# Patient Record
Sex: Female | Born: 1945
Health system: Southern US, Community
[De-identification: ages and names within clinical notes are randomized; demographics above are authoritative.]

## PROBLEM LIST (undated history)

## (undated) DIAGNOSIS — M899 Disorder of bone, unspecified: Secondary | ICD-10-CM

## (undated) DIAGNOSIS — K8020 Calculus of gallbladder without cholecystitis without obstruction: Secondary | ICD-10-CM

## (undated) DIAGNOSIS — Z8659 Personal history of other mental and behavioral disorders: Secondary | ICD-10-CM

## (undated) DIAGNOSIS — K219 Gastro-esophageal reflux disease without esophagitis: Secondary | ICD-10-CM

## (undated) DIAGNOSIS — R300 Dysuria: Secondary | ICD-10-CM

## (undated) DIAGNOSIS — F419 Anxiety disorder, unspecified: Secondary | ICD-10-CM

## (undated) DIAGNOSIS — M1990 Unspecified osteoarthritis, unspecified site: Secondary | ICD-10-CM

## (undated) DIAGNOSIS — J309 Allergic rhinitis, unspecified: Secondary | ICD-10-CM

## (undated) DIAGNOSIS — R609 Edema, unspecified: Secondary | ICD-10-CM

## (undated) DIAGNOSIS — E785 Hyperlipidemia, unspecified: Secondary | ICD-10-CM

## (undated) DIAGNOSIS — M949 Disorder of cartilage, unspecified: Secondary | ICD-10-CM

## (undated) DIAGNOSIS — I493 Ventricular premature depolarization: Secondary | ICD-10-CM

## (undated) DIAGNOSIS — Z9889 Other specified postprocedural states: Secondary | ICD-10-CM

## (undated) DIAGNOSIS — I10 Essential (primary) hypertension: Secondary | ICD-10-CM

## (undated) DIAGNOSIS — E739 Lactose intolerance, unspecified: Secondary | ICD-10-CM

## (undated) DIAGNOSIS — Z8719 Personal history of other diseases of the digestive system: Secondary | ICD-10-CM

## (undated) HISTORY — DX: Essential (primary) hypertension: I10

## (undated) HISTORY — PX: CATARACT EXTRACTION, BILATERAL: SHX1313

## (undated) HISTORY — DX: Disorder of cartilage, unspecified: M94.9

## (undated) HISTORY — DX: Anxiety disorder, unspecified: F41.9

## (undated) HISTORY — DX: Lactose intolerance, unspecified: E73.9

## (undated) HISTORY — DX: Other specified postprocedural states: Z98.890

## (undated) HISTORY — DX: Calculus of gallbladder without cholecystitis without obstruction: K80.20

## (undated) HISTORY — DX: Gastro-esophageal reflux disease without esophagitis: K21.9

## (undated) HISTORY — DX: Personal history of other diseases of the digestive system: Z87.19

## (undated) HISTORY — PX: OTHER SURGICAL HISTORY: SHX169

## (undated) HISTORY — PX: AUGMENTATION MAMMAPLASTY: SUR837

## (undated) HISTORY — DX: Personal history of other mental and behavioral disorders: Z86.59

## (undated) HISTORY — DX: Dysuria: R30.0

## (undated) HISTORY — DX: Hyperlipidemia, unspecified: E78.5

## (undated) HISTORY — DX: Disorder of bone, unspecified: M89.9

## (undated) HISTORY — DX: Allergic rhinitis, unspecified: J30.9

## (undated) HISTORY — DX: Edema, unspecified: R60.9

## (undated) HISTORY — DX: Ventricular premature depolarization: I49.3

## (undated) HISTORY — DX: Unspecified osteoarthritis, unspecified site: M19.90

---

## 1972-07-21 HISTORY — PX: CHOLECYSTECTOMY: SHX55

## 1972-07-21 HISTORY — PX: BREAST SURGERY: SHX581

## 1984-07-21 HISTORY — PX: ABDOMINAL HYSTERECTOMY: SHX81

## 1985-07-21 HISTORY — PX: BREAST ENHANCEMENT SURGERY: SHX7

## 2008-07-28 ENCOUNTER — Encounter: Payer: Self-pay | Admitting: Family Medicine

## 2009-04-16 ENCOUNTER — Encounter: Admission: RE | Admit: 2009-04-16 | Discharge: 2009-04-16 | Payer: Self-pay | Admitting: *Deleted

## 2009-04-16 ENCOUNTER — Encounter: Payer: Self-pay | Admitting: Family Medicine

## 2009-04-17 ENCOUNTER — Encounter: Admission: RE | Admit: 2009-04-17 | Discharge: 2009-04-17 | Payer: Self-pay | Admitting: *Deleted

## 2009-04-30 ENCOUNTER — Ambulatory Visit: Payer: Self-pay | Admitting: Family Medicine

## 2009-04-30 DIAGNOSIS — E785 Hyperlipidemia, unspecified: Secondary | ICD-10-CM

## 2009-04-30 DIAGNOSIS — J309 Allergic rhinitis, unspecified: Secondary | ICD-10-CM | POA: Insufficient documentation

## 2009-04-30 DIAGNOSIS — I10 Essential (primary) hypertension: Secondary | ICD-10-CM | POA: Insufficient documentation

## 2009-04-30 DIAGNOSIS — K219 Gastro-esophageal reflux disease without esophagitis: Secondary | ICD-10-CM | POA: Insufficient documentation

## 2009-04-30 DIAGNOSIS — M899 Disorder of bone, unspecified: Secondary | ICD-10-CM | POA: Insufficient documentation

## 2009-04-30 DIAGNOSIS — M949 Disorder of cartilage, unspecified: Secondary | ICD-10-CM

## 2009-04-30 HISTORY — DX: Disorder of bone, unspecified: M89.9

## 2009-04-30 HISTORY — DX: Gastro-esophageal reflux disease without esophagitis: K21.9

## 2009-04-30 HISTORY — DX: Essential (primary) hypertension: I10

## 2009-04-30 HISTORY — DX: Allergic rhinitis, unspecified: J30.9

## 2009-04-30 HISTORY — DX: Hyperlipidemia, unspecified: E78.5

## 2009-04-30 LAB — CONVERTED CEMR LAB
Cholesterol, target level: 200 mg/dL
HDL goal, serum: 40 mg/dL
LDL Goal: 130 mg/dL

## 2009-05-09 ENCOUNTER — Encounter: Payer: Self-pay | Admitting: Family Medicine

## 2009-07-18 ENCOUNTER — Telehealth: Payer: Self-pay | Admitting: Family Medicine

## 2009-08-13 ENCOUNTER — Ambulatory Visit: Payer: Self-pay | Admitting: Family Medicine

## 2009-08-13 DIAGNOSIS — S8010XA Contusion of unspecified lower leg, initial encounter: Secondary | ICD-10-CM | POA: Insufficient documentation

## 2009-08-23 ENCOUNTER — Telehealth: Payer: Self-pay | Admitting: Family Medicine

## 2009-09-14 ENCOUNTER — Ambulatory Visit: Payer: Self-pay | Admitting: Family Medicine

## 2009-09-17 ENCOUNTER — Ambulatory Visit: Payer: Self-pay | Admitting: Family Medicine

## 2009-10-12 ENCOUNTER — Telehealth: Payer: Self-pay | Admitting: Family Medicine

## 2009-12-12 ENCOUNTER — Ambulatory Visit: Payer: Self-pay | Admitting: Family Medicine

## 2009-12-12 DIAGNOSIS — R609 Edema, unspecified: Secondary | ICD-10-CM

## 2009-12-12 HISTORY — DX: Edema, unspecified: R60.9

## 2009-12-13 ENCOUNTER — Encounter: Payer: Self-pay | Admitting: Family Medicine

## 2009-12-14 ENCOUNTER — Encounter: Payer: Self-pay | Admitting: Family Medicine

## 2009-12-14 ENCOUNTER — Ambulatory Visit: Payer: Self-pay

## 2010-04-11 ENCOUNTER — Ambulatory Visit: Payer: Self-pay | Admitting: Family Medicine

## 2010-04-11 DIAGNOSIS — Z8659 Personal history of other mental and behavioral disorders: Secondary | ICD-10-CM | POA: Insufficient documentation

## 2010-04-11 HISTORY — DX: Personal history of other mental and behavioral disorders: Z86.59

## 2010-05-06 ENCOUNTER — Ambulatory Visit: Payer: Self-pay | Admitting: Family Medicine

## 2010-05-06 DIAGNOSIS — R300 Dysuria: Secondary | ICD-10-CM | POA: Insufficient documentation

## 2010-05-06 HISTORY — DX: Dysuria: R30.0

## 2010-05-06 LAB — CONVERTED CEMR LAB
Ketones, urine, test strip: NEGATIVE
Nitrite: NEGATIVE
Specific Gravity, Urine: 1.02

## 2010-05-08 ENCOUNTER — Encounter: Payer: Self-pay | Admitting: Family Medicine

## 2010-05-20 ENCOUNTER — Telehealth: Payer: Self-pay | Admitting: Family Medicine

## 2010-06-03 ENCOUNTER — Telehealth: Payer: Self-pay | Admitting: *Deleted

## 2010-06-10 ENCOUNTER — Ambulatory Visit: Payer: Self-pay | Admitting: Family Medicine

## 2010-07-17 ENCOUNTER — Ambulatory Visit
Admission: RE | Admit: 2010-07-17 | Discharge: 2010-07-17 | Payer: Self-pay | Source: Home / Self Care | Attending: Family Medicine | Admitting: Family Medicine

## 2010-07-26 ENCOUNTER — Telehealth: Payer: Self-pay | Admitting: Family Medicine

## 2010-08-20 NOTE — Miscellaneous (Signed)
Summary: Orders Update  Clinical Lists Changes  Orders: Added new Test order of Venous Duplex Lower Extremity (Venous Duplex Lower) - Signed 

## 2010-08-20 NOTE — Progress Notes (Signed)
Summary: zoloft  Phone Note Call from Patient Call back at Work Phone [phone] Call back at c [phone] if not avail other no.   Summary of Call: Requests that Zoloft be increased to 100mg .  She has taken that before & feels she needs it for her very high stress & anxiety level.  Trouble sleeping.  Tears frequently.  [facility]. Initial call taken by: Rudy Jew, RN,  October 12, 2009 8:55 AM  Follow-up for Phone Call        OK to titrate Zoloft to 100 mg daily and refill this dose for 6 months. Follow-up by: Evelena Peat MD,  October 12, 2009 10:15 AM  Additional Follow-up for Phone Call Additional follow up Details #1::        Left message to inform that request taken care of & to check with drugstore. Additional Follow-up by: Rudy Jew, RN,  October 12, 2009 10:28 AM    New/Updated Medications: ZOLOFT 100 MG TABS (SERTRALINE HCL) Once daily. Prescriptions: ZOLOFT 100 MG TABS (SERTRALINE HCL) Once daily.  #30 x 6   Entered by:   Rudy Jew, RN   Authorized by:   Evelena Peat MD   Signed by:   Rudy Jew, RN on 10/12/2009   Method used:   Electronically to        [facility]. [address] [phone]* (retail)       [address]       [place], [place]  [address]       Ph: [phone] or [phone]       Fax: [phone]   RxID:   [id number]

## 2010-08-20 NOTE — Assessment & Plan Note (Signed)
Summary: leg injury//ccm   Vital Signs:  Patient profile:   65 year old female Menstrual status:  hysterectomy Temp:     98 degrees F BP sitting:   140 / 90  (left arm) Cuff size:   regular  Vitals Entered By: Sid Falcon LPN (August 13, 2009 1:07 PM) CC: fell yesterday, injury to left leg   History of Present Illness: Patient seen with left leg injury. Fell yesterday when walking from church. She is not sure how she landed on her leg. Had some immediate swelling of the anterior shin. She applied ice several times since then. Ambulating without too much difficulty. Also using Motrin which has helped slightly. Swelling is improved compared to yesterday. Denies any knee pain. No other injuries reported.  Allergies: 1)  Prednisone (Prednisone) 2)  Naproxen (Naproxen) 3)  Erythrocin Stearate (Erythromycin Stearate)  Past History:  Past Medical History: Last updated: 04/30/2009 Chicken pos GERD Hay fever, allergies Hyperlipidemia Hypertension UTI Blood transfusion at 2 weeks age  Past Surgical History: Last updated: 04/30/2009 Breast Augmentation 1987 Cholecystectomy  1974  Hysterectomy  1986 Breast biopsy 1974  Social History: Last updated: 04/30/2009 Occupation:  Nature conservation officer, [employer] Designer, television/film set Divorced Never Smoked Alcohol use-yes  Review of Systems      See HPI  Physical Exam  General:  Well-developed,well-nourished,in no acute distress; alert,appropriate and cooperative throughout examination Lungs:  Normal respiratory effort, chest expands symmetrically. Lungs are clear to auscultation, no crackles or wheezes. Heart:  Normal rate and regular rhythm. S1 and S2 normal without gallop, murmur, click, rub or other extra sounds. Extremities:  patient has extensive bruising and ecchymosis left leg from just below the knee two thirds the way down the leg. This involves the lateral and anterior aspect of leg. No evidence for skin break. Full range of  motion knee and ankle. Tender to palpation over proximal one third tibia and minimally over proximal fibula   Impression & Recommendations:  Problem # 1:  CONTUSION, LEG (ICD-924.5) Suspect contusion.  X-ray to r/o prox tib/fibula fx. Orders: T-Tib/Fib Left (73590TC)  Complete Medication List: 1)  Zoloft 50 Mg Tabs (Sertraline hcl) .... Once daily 2)  Indapamide 1.25 Mg Tabs (Indapamide) .... Once daily 3)  Prilosec 20 Mg Cpdr (Omeprazole) .... Once daily 4)  Cetirizine Hcl 10 Mg Tabs (Cetirizine hcl) .... Once daily 5)  Nasacort Aq 55 Mcg/act Aers (Triamcinolone acetonide(nasal)) .... As needed 6)  Singulair 10 Mg Tabs (Montelukast sodium) .... As needed 7)  Aspirin 81 Mg Tabs (Aspirin) .... Once daily 8)  Calcium 500 Mg Tabs (Calcium carbonate) .... Three times a day 9)  Daily Vitamins For Women Tabs (Multiple vitamins-calcium) .... Once daily  Patient Instructions: 1)  Continue frequent ice and elevation of the next couple of days. 2)  Continue Motrin as needed for soreness and discomfort.

## 2010-08-20 NOTE — Assessment & Plan Note (Signed)
Summary: consult re: sinus and allergy/pt also req flu shot/cjr   Vital Signs:  Patient profile:   65 year old female Menstrual status:  hysterectomy Weight:      180 pounds Temp:     98.0 degrees F oral BP sitting:   148 / 80  (left arm) Cuff size:   regular  Vitals Entered By: Sid Falcon LPN (April 11, 2010 1:54 PM)  History of Present Illness: Patient here for the following items  Long history of seasonal allergies. Takes Zyrtec without relief. Previous intolerance to Singulair. Good success with Nasacort but it was too costly. Has frequent symptoms of nasal congestion. Occasional cough symptoms. No fever or other indicators of infection.  Patient has history of hypertension. Needs refill of medication. Also history of depression stable on Zoloft. Needs refills.  Other issue is she has frequent gas symptoms. Has tried several over-the-counter medications without relief. She tried to eliminate foods that exacerbate. No recent nausea, vomiting, or change in stools. Occ GERD symtoms.  Hypertension History:      She denies headache, chest pain, palpitations, dyspnea with exertion, orthopnea, PND, visual symptoms, neurologic problems, syncope, and side effects from treatment.        Positive major cardiovascular risk factors include female age 39 years old or older, hyperlipidemia, and hypertension.  Negative major cardiovascular risk factors include no history of diabetes and non-tobacco-user status.        Further assessment for target organ damage reveals no history of ASHD, stroke/TIA, or peripheral vascular disease.    Allergies: 1)  Prednisone (Prednisone) 2)  Naproxen (Naproxen) 3)  Erythromycin Base (Erythromycin Base)  Past History:  Past Medical History: Last updated: 12/12/2009 Chicken pos GERD Hay fever, allergies Hyperlipidemia Hypertension Blood transfusion at 2 weeks age  Past Surgical History: Last updated: 04/30/2009 Breast Augmentation  1987 Cholecystectomy  1974  Hysterectomy  1986 Breast biopsy 1974  Family History: Last updated: 04/30/2009 Family History of Arthritis Family History High cholesterol Family History Hypertension Family History of Prostate CA 1st degree relative <50 Mother heavy smoker, emphysema, CHF  Social History: Last updated: 04/30/2009 Occupation:  Nature conservation officer, [employer] Designer, television/film set Divorced Never Smoked Alcohol use-yes  Risk Factors: Smoking Status: never (04/30/2009) PMH-FH-SH reviewed for relevance  Review of Systems  The patient denies anorexia, fever, weight loss, chest pain, syncope, dyspnea on exertion, prolonged cough, headaches, hemoptysis, abdominal pain, melena, hematochezia, and severe indigestion/heartburn.    Physical Exam  General:  Well-developed,well-nourished,in no acute distress; alert,appropriate and cooperative throughout examination Ears:  External ear exam shows no significant lesions or deformities.  Otoscopic examination reveals clear canals, tympanic membranes are intact bilaterally without bulging, retraction, inflammation or discharge. Hearing is grossly normal bilaterally. Nose:  External nasal examination shows no deformity or inflammation. Nasal mucosa are pink and moist without lesions or exudates. Mouth:  Oral mucosa and oropharynx without lesions or exudates.  Teeth in good repair. Neck:  No deformities, masses, or tenderness noted. Lungs:  Normal respiratory effort, chest expands symmetrically. Lungs are clear to auscultation, no crackles or wheezes. Heart:  Normal rate and regular rhythm. S1 and S2 normal without gallop, murmur, click, rub or other extra sounds. Abdomen:  soft and non-tender.   Psych:  normally interactive, good eye contact, not anxious appearing, and not depressed appearing.     Impression & Recommendations:  Problem # 1:  HYPERTENSION (ICD-401.9)  Her updated medication list for this problem includes:    Indapamide  1.25 Mg Tabs (Indapamide) .Marland KitchenMarland KitchenMarland KitchenMarland Kitchen  Once daily  Problem # 2:  ALLERGIC RHINITIS CAUSE UNSPECIFIED (ICD-477.9) Assessment: Deteriorated trial of Astelin nasal. Her updated medication list for this problem includes:    Cetirizine Hcl 10 Mg Tabs (Cetirizine hcl) ..... Once daily    Nasacort Aq 55 Mcg/act Aers (Triamcinolone acetonide(nasal)) .Marland Kitchen... As needed    Astelin 137 Mcg/spray Soln (Azelastine hcl) .Marland Kitchen... 1-2 sprays per nostril two times a day as needed allergies  Problem # 3:  GERD (ICD-530.81)  Her updated medication list for this problem includes:    Prilosec 20 Mg Cpdr (Omeprazole) ..... Once daily  Problem # 4:  DEPRESSION, HX OF (ICD-V11.8)  Complete Medication List: 1)  Indapamide 1.25 Mg Tabs (Indapamide) .... Once daily 2)  Prilosec 20 Mg Cpdr (Omeprazole) .... Once daily 3)  Cetirizine Hcl 10 Mg Tabs (Cetirizine hcl) .... Once daily 4)  Nasacort Aq 55 Mcg/act Aers (Triamcinolone acetonide(nasal)) .... As needed 5)  Aspirin 81 Mg Tabs (Aspirin) .... Once daily 6)  Calcium 500 Mg Tabs (Calcium carbonate) .... Three times a day 7)  Daily Vitamins For Women Tabs (Multiple vitamins-calcium) .... Once daily 8)  Zoloft 100 Mg Tabs (Sertraline hcl) .... Once daily. 9)  Astelin 137 Mcg/spray Soln (Azelastine hcl) .Marland Kitchen.. 1-2 sprays per nostril two times a day as needed allergies  Other Orders: Admin 1st Vaccine (74259) Flu Vaccine 34yrs + ([address])  Hypertension Assessment/Plan:      The patient's hypertensive risk group is category B: At least one risk factor (excluding diabetes) with no target organ damage.  Today's blood pressure is 148/80.     Patient Instructions: 1)  It is important that you exercise reguarly at least 20 minutes 5 times a week. If you develop chest pain, have severe difficulty breathing, or feel very tired, stop exercising immediately and seek medical attention.  2)  Check your  Blood Pressure regularly . If it is above:140/90   you should make an  appointment. Prescriptions: ZOLOFT 100 MG TABS (SERTRALINE HCL) Once daily.  #90 x 3   Entered and Authorized by:   Evelena Peat MD   Signed by:   Evelena Peat MD on 04/11/2010   Method used:   Electronically to        [facility] [address] [phone]* (retail)       [address]       [place], [place]  [address]       Ph: [phone] or [phone]       Fax: [phone]   RxID:   [id number] INDAPAMIDE 1.25 MG TABS (INDAPAMIDE) once daily  #90 x 3   Entered and Authorized by:   Evelena Peat MD   Signed by:   Evelena Peat MD on 04/11/2010   Method used:   Electronically to        [facility] [phone]* (retail)       [address]       [place], [place]  [address]       Ph: [phone] or [phone]       Fax: [phone]   RxID:   [id number] ASTELIN 137 MCG/SPRAY SOLN (AZELASTINE HCL) 1-2 sprays per nostril two times a day as needed allergies  #1 x 6   Entered and Authorized by:   Evelena Peat MD   Signed by:   Evelena Peat MD on 04/11/2010   Method used:   Electronically  to        [facility]. [address] [phone]* (retail)       [address]       [place], [place]  [address]       Ph: [phone] or [phone]       Fax: [phone]   RxID:   [id number]   Flu Vaccine Consent Questions     Do you have a history of severe allergic reactions to this vaccine? no    Any prior history of allergic reactions to egg and/or gelatin? no    Do you have a sensitivity to the preservative Thimersol? no    Do you have a past history of Guillan-Barre Syndrome? no    Do you currently have an acute febrile illness? no    Have you ever had a severe reaction to latex? no    Vaccine information given and explained to patient? yes    Are you currently pregnant? no    Lot Number:[id number]   Exp Date:01/18/2011   Site Given  Left Deltoid IMbflu

## 2010-08-20 NOTE — Progress Notes (Signed)
Summary: Updated new phone number, pt has OV next week, FYI  Phone Note Call from Patient Call back at [phone]   Caller: Patient Call For: Evelena Peat MD Summary of Call: PT IS RETURNING Rebecca Reid CALL. Initial call taken by: Rebecca Reid,  June 03, 2010 10:34 AM  Follow-up for Phone Call        Attempted to call back, no answer, no machine Rebecca Falcon LPN  June 03, 2010 11:50 AM   Additional Follow-up for Phone Call Additional follow up Details #1::        [phone] is the best number.[name]  June 03, 2010 12:38 PM  Updated phone numbers.  Pt has OV 11/21 [facility] LPN  June 03, 2010 12:42 PM

## 2010-08-20 NOTE — Progress Notes (Signed)
Summary: BP  Phone Note Call from Patient   Summary of Call: 170/87 yesterday.  148/93 today.  155 or greater usual since med change.  LisinoprilHCT, she's not sure of strength.  Headache.  Declines ov unless Dr. B says.  Change med?  [facility].  Allergic prednisone, naprosyn, some codeine deriatives.   dID MAKE 3:45 APPT, BUT HOPES SHE DOESN'T HAVE TO COME.  Rudy Jew, RN  May 20, 2010 8:39 AM  Initial call taken by: Rudy Jew, RN,  May 20, 2010 8:36 AM  Follow-up for Phone Call        titrate lisinopril hctz to 20/25 mg (she can take 2 tablets of her current dose to make this) and we need to see her in 2-3 weeks after changing dose. Follow-up by: Evelena Peat MD,  May 20, 2010 9:39 AM  Additional Follow-up for Phone Call Additional follow up Details #1::        LMTCB both numbers. Rudy Jew, RN  May 20, 2010 10:44 AM Patient aware.   Additional Follow-up by: Rudy Jew, RN,  May 20, 2010 12:08 PM    New/Updated Medications: LISINOPRIL-HYDROCHLOROTHIAZIDE 20-25 MG TABS (LISINOPRIL-HYDROCHLOROTHIAZIDE) One daily Prescriptions: LISINOPRIL-HYDROCHLOROTHIAZIDE 20-25 MG TABS (LISINOPRIL-HYDROCHLOROTHIAZIDE) One daily  #30 x 0   Entered by:   Rudy Jew, RN   Authorized by:   Evelena Peat MD   Signed by:   Rudy Jew, RN on 05/20/2010   Method used:   Electronically to        [facility] [phone]* (retail)       [address]       [place], [place]  [address]       Ph: [phone] or [phone]       Fax: [phone]   RxID:   [id number]

## 2010-08-20 NOTE — Progress Notes (Signed)
Summary: Pt fell again, same left leg, Rx request for pain  Phone Note Call from Patient Call back at Home Phone [phone] Call back at [phone] (cell)   Caller: Patient Call For: Rebecca Peat MD Summary of Call: Live call from pt reporting she fell again yesterday around lunchtime "for unknown reason", she was not dizzy, not drinking, did not trip on anything, BP OK.  She landed on her left leg again, same knot when she landed on 1/24.  She has been icing and taking 600mg  Advil every 6-8 hours for pain.  "Does Dr Caryl Never want to see me again".  Per Dr Caryl Never, we would be happy to see her again to discuss possible options to work up reasons for falls and re-evaluate injury as needed. Initial call taken by: Sid Falcon LPN,  August 23, 2009 9:50 AM  Follow-up for Phone Call        Pt informed, she will think about what to do next.  Pt did request something stronger for pain control, stating she was up most of the night in pain.  Discussed what has helped in the past for pain and she said she has never really had anything for pain.  Pt did say her daughter would be with her this weekend and she would only take the stronger med as needed. [facility]. Follow-up by: Sid Falcon LPN,  August 23, 2009 9:53 AM  Additional Follow-up for Phone Call Additional follow up Details #1::        OK to call in Vicodin, 1-2 tablets q 6 hours as needed pain disp #30 with no refill. Additional Follow-up by: Rebecca Peat MD,  August 23, 2009 10:30 AM    Additional Follow-up for Phone Call Additional follow up Details #2::    Rx called in, pt informed.  reminded pt we are available for re-evaluation if she has any questions, concerns.  Instructed pt to try 1 tab first to see how it effects her, concerned for her safety.  Pt voiced her understanding.  Her daughter will be here tonight and she will cal for re-evaluation tomorrow as needed. Follow-up by: Sid Falcon LPN,  August 23, 2009 11:27 AM  New/Updated Medications: VICODIN 5-500 MG TABS (HYDROCODONE-ACETAMINOPHEN) 1-2 tabs every 6 hours as needed pain Prescriptions: VICODIN 5-500 MG TABS (HYDROCODONE-ACETAMINOPHEN) 1-2 tabs every 6 hours as needed pain  #30 x 0   Entered by:   Sid Falcon LPN   Authorized by:   Rebecca Peat MD   Signed by:   Sid Falcon LPN on 36/64/4034   Method used:   Telephoned to ...       [facility] [phone]* (retail)       [address]       [place], [place]  [address]       Ph: [phone] or [phone]       Fax: [phone]   RxID:   [id number]

## 2010-08-20 NOTE — Assessment & Plan Note (Signed)
Summary: URINARY FREQUENCY/OVERACTIVE BLADDER/PS   Vital Signs:  Patient profile:   65 year old female Menstrual status:  hysterectomy Weight:      180 pounds Temp:     98.4 degrees F oral BP sitting:   150 / 90  (left arm) Cuff size:   regular  Vitals Entered By: Sid Falcon LPN (May 06, 2010 4:39 PM)  Serial Vital Signs/Assessments:  Time      Position  BP       Pulse  Resp  Temp     By                     158/98                         Evelena Peat MD   History of Present Illness: Patient seen with 2 week history of urine frequency.  she's had some increased urgency. No burning with urination. Denies any fever, chills, nausea, vomiting, or back pain. She has not changed her caffeine consumption in fact has scaled back some. Has some nocturia. Concerned for possible UTI.  Hypertension treated for several years with indapamide. Recent poor control. frequently feels flushed  Hypertension History:      She denies headache, chest pain, palpitations, dyspnea with exertion, orthopnea, PND, peripheral edema, visual symptoms, neurologic problems, syncope, and side effects from treatment.  She notes no problems with any antihypertensive medication side effects.        Positive major cardiovascular risk factors include female age 63 years old or older, hyperlipidemia, and hypertension.  Negative major cardiovascular risk factors include no history of diabetes and non-tobacco-user status.        Further assessment for target organ damage reveals no history of ASHD, stroke/TIA, or peripheral vascular disease.     Allergies: 1)  Prednisone (Prednisone) 2)  Naproxen (Naproxen) 3)  Erythromycin Base (Erythromycin Base)  Past History:  Past Medical History: Last updated: 12/12/2009 Chicken pos GERD Hay fever, allergies Hyperlipidemia Hypertension Blood transfusion at 2 weeks age  Social History: Last updated: 04/30/2009 Occupation:  Nature conservation officer, [employer] assistant, radio Divorced Never Smoked Alcohol use-yes PMH reviewed for relevance  Review of Systems  The patient denies anorexia, fever, weight loss, chest pain, syncope, dyspnea on exertion, peripheral edema, prolonged cough, headaches, hemoptysis, abdominal pain, melena, hematochezia, severe indigestion/heartburn, hematuria, and incontinence.    Physical Exam  General:  Well-developed,well-nourished,in no acute distress; alert,appropriate and cooperative throughout examination Neck:  No deformities, masses, or tenderness noted. Lungs:  Normal respiratory effort, chest expands symmetrically. Lungs are clear to auscultation, no crackles or wheezes. Heart:  Normal rate and regular rhythm. S1 and S2 normal without gallop, murmur, click, rub or other extra sounds. Msk:  No deformity or scoliosis noted of thoracic or lumbar spine.   Extremities:  No clubbing, cyanosis, edema, or deformity noted with normal full range of motion of all joints.   Skin:  no rashes.   Psych:  normally interactive, good eye contact, not anxious appearing, and not depressed appearing.     Impression & Recommendations:  Problem # 1:  HYPERTENSION (ICD-401.9) Assessment Deteriorated discontinue indapamide. Start lisinopril HCTZ and reassess one month Her updated medication list for this problem includes:    Lisinopril-hydrochlorothiazide 10-12.5 Mg Tabs (Lisinopril-hydrochlorothiazide) ..... One by mouth once daily  Problem # 2:  DYSURIA (ICD-788.1) Assessment: New rule out UTI. Urine culture obtained. Start Cipro 250 b.i.d. pending culture results Her  updated medication list for this problem includes:    Ciprofloxacin Hcl 250 Mg Tabs (Ciprofloxacin hcl) ..... One by mouth two times a day for 7 days  Orders: T-Culture, Urine ([id number]) UA Dipstick w/o Micro (manual) (63875)  Complete Medication List: 1)  Lisinopril-hydrochlorothiazide 10-12.5 Mg Tabs (Lisinopril-hydrochlorothiazide) .... One by mouth once  daily 2)  Prilosec 20 Mg Cpdr (Omeprazole) .... Once daily 3)  Cetirizine Hcl 10 Mg Tabs (Cetirizine hcl) .... Once daily 4)  Nasacort Aq 55 Mcg/act Aers (Triamcinolone acetonide(nasal)) .... As needed 5)  Aspirin 81 Mg Tabs (Aspirin) .... Once daily 6)  Calcium 500 Mg Tabs (Calcium carbonate) .... Three times a day 7)  Daily Vitamins For Women Tabs (Multiple vitamins-calcium) .... Once daily 8)  Zoloft 100 Mg Tabs (Sertraline hcl) .... Once daily. 9)  Astelin 137 Mcg/spray Soln (Azelastine hcl) .Marland Kitchen.. 1-2 sprays per nostril two times a day as needed allergies 10)  Ciprofloxacin Hcl 250 Mg Tabs (Ciprofloxacin hcl) .... One by mouth two times a day for 7 days  Hypertension Assessment/Plan:      The patient's hypertensive risk group is category B: At least one risk factor (excluding diabetes) with no target organ damage.  Today's blood pressure is 150/90.    Patient Instructions: 1)  Please schedule a follow-up appointment in 1 month.  Prescriptions: LISINOPRIL-HYDROCHLOROTHIAZIDE 10-12.5 MG TABS (LISINOPRIL-HYDROCHLOROTHIAZIDE) one by mouth once daily  #30 x 6   Entered and Authorized by:   Evelena Peat MD   Signed by:   Evelena Peat MD on 05/06/2010   Method used:   Electronically to        [facility] [phone]* (retail)       [address]       [place], [place]  [address]       Ph: [phone] or [phone]       Fax: [phone]   RxID:   [id number] CIPROFLOXACIN HCL 250 MG TABS (CIPROFLOXACIN HCL) one by mouth two times a day for 7 days  #14 x 0   Entered and Authorized by:   Evelena Peat MD   Signed by:   Evelena Peat MD on 05/06/2010   Method used:   Electronically to        [facility] [phone]* (retail)       [address]       [place], [place]  [address]       Ph: [phone] or [phone]       Fax: [phone]   RxID:   [id number]    Orders Added: 1)  T-Culture, Urine [[id number]] 2)   UA Dipstick w/o Micro (manual) [81002] 3)  Est. Patient Level IV [15400]    Laboratory Results   Urine Tests    Routine Urinalysis   Color: yellow Appearance: Clear Glucose: negative   (Normal Range: Negative) Bilirubin: negative   (Normal Range: Negative) Ketone: negative   (Normal Range: Negative) Spec. Gravity: 1.020   (Normal Range: 1.003-1.035) Blood: negative   (Normal Range: Negative) pH: 6.0   (Normal Range: 5.0-8.0) Protein: trace   (Normal Range: Negative) Urobilinogen: 0.2   (Normal Range: 0-1) Nitrite: negative   (Normal Range: Negative) Leukocyte Esterace: moderate   (Normal Range: Negative)    Comments: Sid Falcon LPN  May 06, 2010 4:51 PM

## 2010-08-20 NOTE — Assessment & Plan Note (Signed)
Summary: FOLLOW UP FROM LEG INJURY/CJR   Vital Signs:  Patient profile:   65 year old female Menstrual status:  hysterectomy Temp:     98.4 degrees F oral BP sitting:   130 / 90  (left arm) Cuff size:   regular  Vitals Entered By: Sid Falcon LPN (September 14, 2009 4:11 PM) CC: left leg ongoing swelling and discomfort   History of Present Illness: Persistent left leg pain. Initial fall late January. Had hematoma. Ambulating without much difficulty but then fell again in February. No syncope. No weakness associated with her fall. Landed on same region left tibia and has had some increased soreness proximal tibia since then. Still ambulating but has some associated pain. Hematoma slow to resolve. Denies any thigh pain or calf pain. No overlying erythema or warmth.  Allergies: 1)  Prednisone (Prednisone) 2)  Naproxen (Naproxen) 3)  Erythrocin Stearate (Erythromycin Stearate)  Past History:  Past Medical History: Last updated: 04/30/2009 Chicken pos GERD Hay fever, allergies Hyperlipidemia Hypertension UTI Blood transfusion at 2 weeks age PMH reviewed for relevance  Physical Exam  General:  Well-developed,well-nourished,in no acute distress; alert,appropriate and cooperative throughout examination Extremities:  left leg reveals a hematoma lateral upper leg region. No warmth or erythema. She has some tenderness to palpation over the proximal tibia which she states has increased since the second fall. Good range of motion knee. No calf tenderness. Mild dependent edema below hematoma left leg   Impression & Recommendations:  Problem # 1:  CONTUSION, LEG (ICD-924.5) we have recommended repeat x-ray given the fact that she had a second fall. Initial x-rays were negative but she fell the same region again and has at this point tenderness over proximal tibia and fibula Orders: T-Tib/Fib Left (73590TC)  Complete Medication List: 1)  Zoloft 50 Mg Tabs (Sertraline hcl) .... Once  daily 2)  Indapamide 1.25 Mg Tabs (Indapamide) .... Once daily 3)  Prilosec 20 Mg Cpdr (Omeprazole) .... Once daily 4)  Cetirizine Hcl 10 Mg Tabs (Cetirizine hcl) .... Once daily 5)  Nasacort Aq 55 Mcg/act Aers (Triamcinolone acetonide(nasal)) .... As needed 6)  Singulair 10 Mg Tabs (Montelukast sodium) .... As needed 7)  Aspirin 81 Mg Tabs (Aspirin) .... Once daily 8)  Calcium 500 Mg Tabs (Calcium carbonate) .... Three times a day 9)  Daily Vitamins For Women Tabs (Multiple vitamins-calcium) .... Once daily 10)  Vicodin 5-500 Mg Tabs (Hydrocodone-acetaminophen) .Marland Kitchen.. 1-2 tabs every 6 hours as needed pain  Patient Instructions: 1)  Continue frequent elevation of leg. 2)  Continue topical heat for hematoma

## 2010-08-20 NOTE — Assessment & Plan Note (Signed)
Summary: leg pain/dm   Vital Signs:  Patient profile:   65 year old female Menstrual status:  hysterectomy Temp:     98.2 degrees F oral BP sitting:   140 / 80  (left arm) Cuff size:   regular  Vitals Entered By: Sid Falcon LPN (Dec 12, 2009 4:28 PM) CC: left leg pain, ongoing   History of Present Illness: Injury to L ant leg in January of this year with hematoma. Reinjury in Feb.  Tib/fib studies were normal. No signif pain with ambulation but now has persistent swelling LLE mostly below knee and some "achy" pain L thigh.  No back pain.  No weakness or numbness. Pt concerned b/o long trip driving to [place] next week.  No prior hx of DVT.  Allergies: 1)  Prednisone (Prednisone) 2)  Naproxen (Naproxen) 3)  Erythromycin Base (Erythromycin Base)  Past History:  Past Medical History: Chicken pos GERD Hay fever, allergies Hyperlipidemia Hypertension Blood transfusion at 2 weeks age  Review of Systems  The patient denies anorexia, fever, weight loss, chest pain, dyspnea on exertion, and hemoptysis.    Physical Exam  General:  Well-developed,well-nourished,in no acute distress; alert,appropriate and cooperative throughout examination Heart:  normal rate and regular rhythm.   Extremities:  resolving L leg ant hematoma.  Mild edemal LLE c/w R.  No localized tenderness.  L hip full ROM.  No skin changes. Neurologic:  Full strenght with plantar and dorsi flexion.   Impression & Recommendations:  Problem # 1:  LEG EDEMA (ICD-782.3) Prob related to recent hematoma but with progressive thigh pain, r/o DVT.  Venous dopplers to further evaluate. Her updated medication list for this problem includes:    Indapamide 1.25 Mg Tabs (Indapamide) ..... Once daily  Orders: Radiology Referral (Radiology)  Complete Medication List: 1)  Zoloft 50 Mg Tabs (Sertraline hcl) .... Once daily 2)  Indapamide 1.25 Mg Tabs (Indapamide) .... Once daily 3)  Prilosec 20 Mg Cpdr  (Omeprazole) .... Once daily 4)  Cetirizine Hcl 10 Mg Tabs (Cetirizine hcl) .... Once daily 5)  Nasacort Aq 55 Mcg/act Aers (Triamcinolone acetonide(nasal)) .... As needed 6)  Singulair 10 Mg Tabs (Montelukast sodium) .... As needed 7)  Aspirin 81 Mg Tabs (Aspirin) .... Once daily 8)  Calcium 500 Mg Tabs (Calcium carbonate) .... Three times a day 9)  Daily Vitamins For Women Tabs (Multiple vitamins-calcium) .... Once daily 10)  Vicodin 5-500 Mg Tabs (Hydrocodone-acetaminophen) .Marland Kitchen.. 1-2 tabs every 6 hours as needed pain 11)  Zoloft 100 Mg Tabs (Sertraline hcl) .... Once daily.

## 2010-08-20 NOTE — Assessment & Plan Note (Signed)
Summary: 1 MONTH EROV/NJR   Vital Signs:  Patient profile:   65 year old female Menstrual status:  hysterectomy Temp:     98.2 degrees F oral BP sitting:   120 / 84  (left arm) Cuff size:   regular  Vitals Entered By: Sid Falcon LPN (June 10, 2010 4:23 PM)  History of Present Illness: Followup hypertension. Recent initiation of lisinopril HCTZ.   Poorly controlled at 10/12.5 mg dose. After doubling her dose she had great improvement in blood pressure readings. Overall feels much better. Less fatigue. No significant cough or any other side effects. No orthostatic symptoms. No dizziness.  Hypertension History:      She denies headache, chest pain, palpitations, dyspnea with exertion, orthopnea, PND, peripheral edema, visual symptoms, neurologic problems, syncope, and side effects from treatment.        Positive major cardiovascular risk factors include female age 59 years old or older, hyperlipidemia, and hypertension.  Negative major cardiovascular risk factors include no history of diabetes and non-tobacco-user status.        Further assessment for target organ damage reveals no history of ASHD, stroke/TIA, or peripheral vascular disease.     Allergies: 1)  Prednisone (Prednisone) 2)  Naproxen (Naproxen) 3)  Erythromycin Base (Erythromycin Base)  Past History:  Past Medical History: Last updated: 12/12/2009 Chicken pos GERD Hay fever, allergies Hyperlipidemia Hypertension Blood transfusion at 2 weeks age  Physical Exam  General:  Well-developed,well-nourished,in no acute distress; alert,appropriate and cooperative throughout examination Neck:  No deformities, masses, or tenderness noted. Lungs:  Normal respiratory effort, chest expands symmetrically. Lungs are clear to auscultation, no crackles or wheezes. Heart:  Normal rate and regular rhythm. S1 and S2 normal without gallop, murmur, click, rub or other extra sounds. Extremities:  no edema.   Impression &  Recommendations:  Problem # 1:  HYPERTENSION (ICD-401.9) Assessment Improved refilled for one year. Her updated medication list for this problem includes:    Lisinopril-hydrochlorothiazide 20-25 Mg Tabs (Lisinopril-hydrochlorothiazide) ..... One daily  Complete Medication List: 1)  Lisinopril-hydrochlorothiazide 20-25 Mg Tabs (Lisinopril-hydrochlorothiazide) .... One daily 2)  Prilosec 20 Mg Cpdr (Omeprazole) .... Once daily 3)  Cetirizine Hcl 10 Mg Tabs (Cetirizine hcl) .... Once daily 4)  Nasacort Aq 55 Mcg/act Aers (Triamcinolone acetonide(nasal)) .... As needed 5)  Aspirin 81 Mg Tabs (Aspirin) .... Once daily 6)  Calcium 500 Mg Tabs (Calcium carbonate) .... Three times a day 7)  Daily Vitamins For Women Tabs (Multiple vitamins-calcium) .... Once daily 8)  Zoloft 100 Mg Tabs (Sertraline hcl) .... Once daily. 9)  Astelin 137 Mcg/spray Soln (Azelastine hcl) .Marland Kitchen.. 1-2 sprays per nostril two times a day as needed allergies 10)  Ciprofloxacin Hcl 250 Mg Tabs (Ciprofloxacin hcl) .... One by mouth two times a day for 7 days  Hypertension Assessment/Plan:      The patient's hypertensive risk group is category B: At least one risk factor (excluding diabetes) with no target organ damage.  Today's blood pressure is 120/84.    Patient Instructions: 1)  It is important that you exercise reguarly at least 20 minutes 5 times a week. If you develop chest pain, have severe difficulty breathing, or feel very tired, stop exercising immediately and seek medical attention.  2)  Check your  Blood Pressure regularly . If it is above:140/90   you should make an appointment. Prescriptions: LISINOPRIL-HYDROCHLOROTHIAZIDE 20-25 MG TABS (LISINOPRIL-HYDROCHLOROTHIAZIDE) One daily  #90 x 3   Entered and Authorized by:   Evelena Peat MD  Signed by:   Evelena Peat MD on 06/10/2010   Method used:   Electronically to        [facility]. [address] [phone]* (retail)       [address]       [place], [place]  [address]       Ph: [phone] or [phone]       Fax: [phone]   RxID:   [id number]    Orders Added: 1)  Est. Patient Level III [66440]

## 2010-08-22 NOTE — Assessment & Plan Note (Signed)
Summary: toe trauma/dm   Vital Signs:  Patient profile:   65 year old female Menstrual status:  hysterectomy Temp:     98.1 degrees F BP sitting:   140 / 80  (left arm) Cuff size:   regular  Vitals Entered By: Sid Falcon LPN (July 17, 2010 4:40 PM)  History of Present Illness: L 5th toe injury.  Occurred 3 weeks ago when she inadvertently kicked some rolled up carpet.  Had some immediated swelling and bruising and  ?initial dislocation.  This went back into place after pulling on the toe and taping. Still sore but is somewhat improved.  Still swells.  Allergies: 1)  Prednisone (Prednisone) 2)  Naproxen (Naproxen) 3)  Erythromycin Base (Erythromycin Base)  Past History:  Past Medical History: Last updated: 12/12/2009 Chicken pos GERD Hay fever, allergies Hyperlipidemia Hypertension Blood transfusion at 2 weeks age  Physical Exam  General:  Well-developed,well-nourished,in no acute distress; alert,appropriate and cooperative throughout examination Lungs:  Normal respiratory effort, chest expands symmetrically. Lungs are clear to auscultation, no crackles or wheezes. Heart:  normal rate and regular rhythm.   Extremities:  L 5th toe reveals some moderated edema.  No eythema or warmth.  Mildly tender to palpation.   Impression & Recommendations:  Problem # 1:  TOE PAIN (ICD-729.5) Assessment New susect she sustained fx 3 weeks ago.  Offered X-ray but explained this would not likely change management at this time. Consider walking shoe vs shoe with more rigid sole.  Complete Medication List: 1)  Lisinopril-hydrochlorothiazide 20-25 Mg Tabs (Lisinopril-hydrochlorothiazide) .... One daily 2)  Prilosec 20 Mg Cpdr (Omeprazole) .... Once daily 3)  Cetirizine Hcl 10 Mg Tabs (Cetirizine hcl) .... Once daily 4)  Nasacort Aq 55 Mcg/act Aers (Triamcinolone acetonide(nasal)) .... As needed 5)  Aspirin 81 Mg Tabs (Aspirin) .... Once daily 6)  Calcium 500 Mg Tabs (Calcium  carbonate) .... Three times a day 7)  Daily Vitamins For Women Tabs (Multiple vitamins-calcium) .... Once daily 8)  Zoloft 100 Mg Tabs (Sertraline hcl) .... Once daily. 9)  Astelin 137 Mcg/spray Soln (Azelastine hcl) .Marland Kitchen.. 1-2 sprays per nostril two times a day as needed allergies 10)  Ciprofloxacin Hcl 250 Mg Tabs (Ciprofloxacin hcl) .... One by mouth two times a day for 7 days   Orders Added: 1)  Est. Patient Level III [54098]

## 2010-08-22 NOTE — Progress Notes (Signed)
  Phone Note Call from Patient   Caller: Patient Call For: Evelena Peat MD Summary of Call: Pt. is calling with complaints of sinus congestion, cough, productive, and URI symptoms.  Is askng for an antibiotic.  She has been using OTC products x one week.   Initial call taken by: [facility] CMA AAMA,  July 26, 2010 9:11 AM  Follow-up for Phone Call        OK to call in Zithromax (Z-pack) and needs to be seen if no better in one week. Follow-up by: Evelena Peat MD,  July 26, 2010 12:27 PM    New/Updated Medications: ZITHROMAX Z-PAK 250 MG TABS (AZITHROMYCIN) as directed Prescriptions: ZITHROMAX Z-PAK 250 MG TABS (AZITHROMYCIN) as directed  #6 x 0   Entered by:   Lynann Beaver CMA AAMA   Authorized by:   Evelena Peat MD   Signed by:   Lynann Beaver CMA AAMA on 07/26/2010   Method used:   Electronically to        [facility] [address] (retail)       [address]       [place], [place]  [address]       Ph: [phone] or [phone]       Fax: [phone]   RxID:   [id number] ZITHROMAX Z-PAK 250 MG TABS (AZITHROMYCIN) as directed  #6 x 0   Entered by:   Lynann Beaver CMA AAMA   Authorized by:   Evelena Peat MD   Signed by:   Lynann Beaver CMA AAMA on 07/26/2010   Method used:   Electronically to        [facility]  [id number] (retail)       [address]       [place], [place]  [address]       Ph: [phone] or [phone]       Fax: [phone]   RxID:   [id number]

## 2010-08-27 ENCOUNTER — Telehealth: Payer: Self-pay | Admitting: *Deleted

## 2010-08-27 NOTE — Telephone Encounter (Signed)
Pt calls stating she is coughing again, with sinus congestion, cough and some bleeding from nose. Wants antibiotic to [facility] [place]

## 2010-08-27 NOTE — Telephone Encounter (Signed)
Left message for pt to call back for appt. Tomorrow with Dr. Caryl Never

## 2010-08-27 NOTE — Telephone Encounter (Signed)
Recommend she be seen.

## 2010-08-28 NOTE — Telephone Encounter (Signed)
Left message to call back for appt

## 2010-12-09 ENCOUNTER — Ambulatory Visit (INDEPENDENT_AMBULATORY_CARE_PROVIDER_SITE_OTHER): Payer: BC Managed Care – PPO | Admitting: Family Medicine

## 2010-12-09 ENCOUNTER — Encounter: Payer: Self-pay | Admitting: Family Medicine

## 2010-12-09 ENCOUNTER — Telehealth: Payer: Self-pay | Admitting: *Deleted

## 2010-12-09 VITALS — BP 150/88 | Temp 98.2°F | Ht 65.75 in | Wt 175.0 lb

## 2010-12-09 DIAGNOSIS — H6640 Suppurative otitis media, unspecified, unspecified ear: Secondary | ICD-10-CM

## 2010-12-09 DIAGNOSIS — R059 Cough, unspecified: Secondary | ICD-10-CM

## 2010-12-09 DIAGNOSIS — R05 Cough: Secondary | ICD-10-CM

## 2010-12-09 MED ORDER — HYDROCODONE-HOMATROPINE 5-1.5 MG/5ML PO SYRP: 5.0000 mL | ORAL_SOLUTION | Freq: Four times a day (QID) | ORAL | Status: DC | PRN

## 2010-12-09 MED ORDER — AMOXICILLIN 875 MG PO TABS: 875.0000 mg | ORAL_TABLET | Freq: Two times a day (BID) | ORAL | Status: AC

## 2010-12-09 NOTE — Telephone Encounter (Signed)
[facility] Triage Call Report Triage Record Num: [id number] Operator: Jeraldine Loots Patient Name: Rebecca Reid Call Date & Time: 12/07/2010 8:24:47AM Patient Phone: [phone] PCP: Evelena Peat Patient Gender: Female PCP Fax : [phone] Patient DOB: 10/06/1945 [facility] Name: [facility] Reason for Call: Pt calling, has had a cough and congestion all week. Has green sputum. No fever today but did have a fever for 2 days earlier in the week. Needs to be seen in 24 hours per Cough protocal. Sent to [facility] this am. Protocol(s) Used: Cough - Adult Recommended Outcome per Protocol: See Provider within 24 hours Reason for Outcome: Productive cough with colored sputum (other than clear or white sputum) Care Advice: ~ 12/07/2010 8:30:58AM Page 1 of 1

## 2010-12-09 NOTE — Progress Notes (Signed)
  Subjective:    Patient ID: Rebecca Reid, female    DOB: 12-27-1945, 65 y.o.   MRN: [id number]  HPI Patient seen with acute illness. Onset one week ago sore throat and cough. Some dyspnea. Questionable wheezing also noted. Sore throat is slightly better. Initial low-grade fever first couple of days now resolved. Now has right ear pain. No change of hearing. Cough especially bothersome at night. Patient is nonsmoker. No history of asthma. Blood pressure well controlled with lisinopril HCTZ   Review of Systems  Constitutional: Positive for fatigue. Negative for fever and chills.  HENT: Positive for sore throat. Negative for trouble swallowing and voice change.   Respiratory: Positive for cough and shortness of breath.   Cardiovascular: Negative for chest pain, palpitations and leg swelling.  Genitourinary: Negative for dysuria.       Objective:   Physical Exam  Constitutional: She appears well-developed and well-nourished.  HENT:  Head: Normocephalic and atraumatic.  Left Ear: External ear normal.  Mouth/Throat: Oropharynx is clear and moist. No oropharyngeal exudate.       Right eardrum is erythematous and bulging with distorted landmarks. No perforation.  Neck: Neck supple. No thyromegaly present.  Cardiovascular: Normal rate, regular rhythm and normal heart sounds.   Pulmonary/Chest: Effort normal and breath sounds normal. No respiratory distress. She has no wheezes. She has no rales.  Musculoskeletal: She exhibits no edema.  Lymphadenopathy:    She has no cervical adenopathy.          Assessment & Plan:  Probable acute viral illness. She has evidence for probable right suppurative otitis. Amoxicillin 875 mg twice daily for 10 days. Hycodan cough syrup.

## 2010-12-20 ENCOUNTER — Encounter: Payer: Self-pay | Admitting: Family Medicine

## 2010-12-20 ENCOUNTER — Ambulatory Visit (INDEPENDENT_AMBULATORY_CARE_PROVIDER_SITE_OTHER): Payer: BC Managed Care – PPO | Admitting: Family Medicine

## 2010-12-20 VITALS — BP 130/90 | Temp 98.1°F

## 2010-12-20 DIAGNOSIS — I10 Essential (primary) hypertension: Secondary | ICD-10-CM

## 2010-12-20 MED ORDER — HYDROCOD POLST-CHLORPHEN POLST 10-8 MG/5ML PO LQCR: 5.0000 mL | Freq: Two times a day (BID) | ORAL | Status: DC

## 2010-12-20 MED ORDER — LOSARTAN POTASSIUM-HCTZ 100-25 MG PO TABS: 1.0000 | ORAL_TABLET | Freq: Every day | ORAL | Status: DC

## 2010-12-20 NOTE — Progress Notes (Signed)
  Subjective:    Patient ID: Rebecca Reid, female    DOB: [birth year], 65 y.o.   MRN: [id number]  HPI Patient seen with persistent cough. Refer to prior note. Patient had supperative otitis and treated with amoxicillin. She has finished antibiotic. Hycodan helped cough temporarily. Cough is dry. Worse with lying supine at night. Possibly some mild postnasal drip symptoms. She has history of GERD but symptoms fairly well controlled on Prilosec. Does take lisinopril HCTZ for hypertension and no cough prior about 3-4 weeks ago. Denies any appetite or weight changes. No dyspnea. No hemoptysis. No pleuritic pain.   Review of Systems  Constitutional: Negative for fever, chills, activity change, appetite change and unexpected weight change.  HENT: Positive for postnasal drip. Negative for sore throat, voice change and sinus pressure.   Eyes: Negative for pain.  Respiratory: Positive for cough. Negative for shortness of breath and wheezing.   Cardiovascular: Negative for chest pain, palpitations and leg swelling.  Skin: Negative for rash.  Neurological: Negative for headaches.       Objective:   Physical Exam  Constitutional: She appears well-developed and well-nourished. No distress.  HENT:  Right Ear: External ear normal.  Left Ear: External ear normal.  Mouth/Throat: Oropharynx is clear and moist. No oropharyngeal exudate.  Neck: Neck supple. No thyromegaly present.  Cardiovascular: Normal rate, regular rhythm and normal heart sounds.   Pulmonary/Chest: Effort normal and breath sounds normal. No respiratory distress. She has no wheezes. She has no rales. She exhibits no tenderness.  Lymphadenopathy:    She has no cervical adenopathy.          Assessment & Plan:  Persistent cough. Differential includes post viral bronchitis cough, ACE inhibitor related cough, postnasal drip related cough, or GERD related cough. Discontinue lisinopril. Switched to losartan HCTZ. Tussionex for nighttime  suppression of cough. Continue Prilosec. Elevate head of bed 6-8 inches. Avoid mentholated cough drops. Touch base one to 2 weeks if no better. Chest x-ray and further evaluation if cough not improved at that time.

## 2010-12-20 NOTE — Patient Instructions (Signed)
Touch base in one to two weeks if no better.

## 2011-01-06 ENCOUNTER — Telehealth: Payer: Self-pay | Admitting: *Deleted

## 2011-01-06 NOTE — Telephone Encounter (Signed)
Pt sent lab results to Dr Caryl Never for review, sill scan to her chart, ot informed on VM labs look good per Doctor

## 2011-05-07 ENCOUNTER — Other Ambulatory Visit: Payer: Self-pay | Admitting: Family Medicine

## 2011-05-16 ENCOUNTER — Encounter: Payer: Self-pay | Admitting: Family Medicine

## 2011-05-16 ENCOUNTER — Ambulatory Visit (INDEPENDENT_AMBULATORY_CARE_PROVIDER_SITE_OTHER): Payer: BC Managed Care – PPO | Admitting: Family Medicine

## 2011-05-16 VITALS — BP 130/90 | Temp 98.0°F | Wt 175.0 lb

## 2011-05-16 DIAGNOSIS — M79609 Pain in unspecified limb: Secondary | ICD-10-CM

## 2011-05-16 DIAGNOSIS — H9209 Otalgia, unspecified ear: Secondary | ICD-10-CM

## 2011-05-16 DIAGNOSIS — H9203 Otalgia, bilateral: Secondary | ICD-10-CM

## 2011-05-16 DIAGNOSIS — Z23 Encounter for immunization: Secondary | ICD-10-CM

## 2011-05-16 DIAGNOSIS — M79605 Pain in left leg: Secondary | ICD-10-CM

## 2011-05-16 DIAGNOSIS — I10 Essential (primary) hypertension: Secondary | ICD-10-CM

## 2011-05-16 DIAGNOSIS — M79604 Pain in right leg: Secondary | ICD-10-CM

## 2011-05-16 NOTE — Patient Instructions (Signed)
Restless Legs Syndrome Restless legs syndrome is a movement disorder. It may also be called a sensori-motor disorder.  CAUSES  No one knows what specifically causes restless legs syndrome, but it tends to run in families. It is also more common in people with low iron, in pregnancy, in people who need dialysis, and those with nerve damage (neuropathy).Some medications may make restless legs syndrome worse.Those medications include drugs to treat high blood pressure, some heart conditions, nausea, colds, allergies, and depression. SYMPTOMS Symptoms include uncomfortable sensations in the legs. These leg sensations are worse during periods of inactivity or rest. They are also worse while sitting or lying down. Individuals that have the disorder describe sensations in the legs that feel like:  Pulling.   Drawing.   Crawling.   Worming.   Boring.   Tingling.   Pins and needles.   Prickling.   Pain.  The sensations are usually accompanied by an overwhelming urge to move the legs. Sudden muscle jerks may also occur. Movement provides temporary relief from the discomfort. In rare cases, the arms may also be affected. Symptoms may interfere with going to sleep (sleep onset insomnia). Restless legs syndrome may also be related to periodic limb movement disorder (PLMD). PLMD is another more common motor disorder. It also causes interrupted sleep. The symptoms from PLMD usually occur most often when you are awake. TREATMENT  Treatment for restless legs syndrome is symptomatic. This means that the symptoms are treated.   Massage and cold compresses may provide temporary relief.   Walk, stretch, or take a cold or hot bath.   Get regular exercise and a good night's sleep.   Avoid caffeine, alcohol, nicotine, and medications that can make it worse.   Do activities that provide mental stimulation like discussions, needlework, and video games. These may be helpful if you are not able to walk or  stretch.  Some medications are effective in relieving the symptoms. However, many of these medications have side effects. Ask your caregiver about medications that may help your symptoms. Correcting iron deficiency may improve symptoms for some patients. Document Released: 06/27/2002 Document Revised: 03/19/2011 Document Reviewed: 10/03/2010 [facility] Patient Information 2012 [place], [place].

## 2011-05-16 NOTE — Progress Notes (Signed)
  Subjective:    Patient ID: Rebecca Reid, female    DOB: [birth year], 65 y.o.   MRN: [id number]  HPI  Patient seen for the following issues. Bilateral ear fullness and mild bilateral ache over the past several days. She has some allergies and takes Zyrtec. Occasional dry cough. No vertigo. No hearing changes. No ear drainage.  Bilateral leg discomfort left greater than right. Poorly localized. Has some discomfort mostly back of knee, and lower leg. This occurs mostly at night. Some symptoms of restlessness. Relieved with rubbing legs and ambulation. No pain with walking.  No history of restless leg syndrome. Denies low back pain. Has occasional leg cramps but this pain is different.  Hypertension treated with losartan HCTZ. Blood pressure well controlled by home readings. Had lab work through work last May unremarkable.   Review of Systems  Constitutional: Negative for fever, chills, activity change, fatigue and unexpected weight change.  Respiratory: Negative for cough and shortness of breath.   Cardiovascular: Negative for chest pain, palpitations and leg swelling.  Gastrointestinal: Negative for abdominal pain.  Musculoskeletal: Positive for myalgias. Negative for back pain, joint swelling and gait problem.  Hematological: Negative for adenopathy. Does not bruise/bleed easily.       Objective:   Physical Exam  Constitutional: She appears well-developed and well-nourished.  HENT:  Right Ear: External ear normal.  Left Ear: External ear normal.  Mouth/Throat: Oropharynx is clear and moist.  Neck: Neck supple.  Cardiovascular: Normal rate and regular rhythm.   Pulmonary/Chest: Effort normal and breath sounds normal. No respiratory distress. She has no wheezes. She has no rales.  Musculoskeletal: She exhibits no edema.       Straight leg raise is negative. She has good distal foot pulses. Both feet are warm to touch. Good capillary refill.  Lymphadenopathy:    She has no cervical  adenopathy.  Neurological:       Strength full lower extremities. Symmetric reflexes.          Assessment & Plan:  #1 leg discomfort. Question restless leg syndrome. Scale back caffeine use. Educational sheet given. Discussed possible medication options and she is not interested at this time. If symptoms persist/worsen consider Mirapex or Requip. #2 bilateral ear ache. Nonfocal exam. Question eustachian tube dysfunction. Continue Zyrtec #3 hypertension stable. Continue current medications

## 2011-08-13 ENCOUNTER — Other Ambulatory Visit: Payer: Self-pay | Admitting: Family Medicine

## 2011-09-25 ENCOUNTER — Telehealth: Payer: Self-pay

## 2011-09-25 MED ORDER — AMOXICILLIN 875 MG PO TABS: 875.0000 mg | ORAL_TABLET | Freq: Two times a day (BID) | ORAL | Status: AC

## 2011-09-25 NOTE — Telephone Encounter (Signed)
Called pt to make aware.  Pt states she understands and rx has been sent to pharmacy.

## 2011-09-25 NOTE — Telephone Encounter (Signed)
Consider saline nasal irrigation. Amoxicillin 875 mg twice daily for 10 days and patient needs office followup if not resolving with this

## 2011-09-25 NOTE — Telephone Encounter (Signed)
Pt states she has congestion, cough, mucus yellow and green in color.  Pt states she has had this for 1 week.  Pt has been using Mucinex and Alka seltzer.  Pt would like an rx sent to [facility] on [address] in [place].  Offered pt and office visit but she declined at this time.  Pls advise.

## 2011-10-13 ENCOUNTER — Encounter: Payer: Self-pay | Admitting: Family Medicine

## 2011-10-13 ENCOUNTER — Ambulatory Visit (INDEPENDENT_AMBULATORY_CARE_PROVIDER_SITE_OTHER): Payer: BC Managed Care – PPO | Admitting: Family Medicine

## 2011-10-13 VITALS — BP 140/98 | Temp 98.0°F | Wt 182.0 lb

## 2011-10-13 DIAGNOSIS — R05 Cough: Secondary | ICD-10-CM

## 2011-10-13 DIAGNOSIS — R059 Cough, unspecified: Secondary | ICD-10-CM

## 2011-10-13 MED ORDER — BENZONATATE 200 MG PO CAPS: 200.0000 mg | ORAL_CAPSULE | Freq: Two times a day (BID) | ORAL | Status: DC | PRN

## 2011-10-13 NOTE — Progress Notes (Signed)
  Subjective:    Patient ID: Rebecca Reid, female    DOB: 1946/05/29, 66 y.o.   MRN: [id number]  HPI  Patient seen with cough a few weeks duration. She developed sinusitis initially. Treated with a full 10 days of amoxicillin and sinus symptoms are better but she has a dry cough. No fever. Denies any hemoptysis. No pleuritic pain. No wheezing. She tried Mucinex without much improvement. Leftover Tussionex which helps nighttime coughing. No history of smoking. No appetite or weight changes.  Past Medical History  Diagnosis Date  . HYPERLIPIDEMIA 04/30/2009  . HYPERTENSION 04/30/2009  . ALLERGIC RHINITIS CAUSE UNSPECIFIED 04/30/2009  . GERD 04/30/2009  . OSTEOPENIA 04/30/2009  . LEG EDEMA 12/12/2009  . Dysuria 05/06/2010  . DEPRESSION, HX OF 04/11/2010   Past Surgical History  Procedure Date  . Breast enhancement surgery 1987  . Cholecystectomy 1974  . Abdominal hysterectomy 1986  . Breast surgery 1974    biopsy    reports that she has never smoked. She does not have any smokeless tobacco history on file. Her alcohol and drug histories not on file. family history includes Arthritis in her other; Cancer in her other; Emphysema in her mother; Hyperlipidemia in her other; and Hypertension in her other. Allergies  Allergen Reactions  . Erythromycin Base     REACTION: yeast infection, GI upset  . Naproxen     REACTION: GI upset  . Prednisone     REACTION: hives, anxiety, insomnia      Review of Systems  Constitutional: Positive for fatigue. Negative for fever and chills.  HENT: Positive for voice change.   Respiratory: Positive for cough. Negative for shortness of breath and wheezing.   Cardiovascular: Negative for chest pain.       Objective:   Physical Exam  Constitutional: She appears well-developed and well-nourished. No distress.  HENT:  Right Ear: External ear normal.  Left Ear: External ear normal.  Mouth/Throat: Oropharynx is clear and moist.  Neck: Neck  supple.  Cardiovascular: Normal rate and regular rhythm.   Pulmonary/Chest: Effort normal and breath sounds normal. No respiratory distress. She has no wheezes. She has no rales.  Musculoskeletal: She exhibits no edema.  Lymphadenopathy:    She has no cervical adenopathy.          Assessment & Plan:  Cough. Probably related to recent viral illness. Tessalon Perles 200 mg every 8 hours prn cough. Followup promptly for any fever or worsening symptoms. She'll continue Tussionex for Nighttime use

## 2011-10-13 NOTE — Patient Instructions (Signed)
Follow up promptly for any fever or worsening symptoms 

## 2011-10-24 ENCOUNTER — Telehealth: Payer: Self-pay | Admitting: *Deleted

## 2011-10-24 NOTE — Telephone Encounter (Signed)
Patient is calling for a referral to a cardiologist and GI. No other information given

## 2011-10-27 NOTE — Telephone Encounter (Signed)
LMTCB to add some explaination for her request.

## 2011-10-28 NOTE — Telephone Encounter (Signed)
Pt called back, she "has been having some things go on lately and is looking for a Recommendation for a good Cardiologist and GI "just in case she need them".  I asked pt to come for OV to discuss issues (that she did not share with me).  Pt is requesting names of good specialists.  Pt says her insurance does not require a formal referral.

## 2011-10-28 NOTE — Telephone Encounter (Signed)
Follow up and we can discuss

## 2011-10-28 NOTE — Telephone Encounter (Signed)
LMTCB again today on work phone

## 2011-10-29 NOTE — Telephone Encounter (Signed)
Pt informed on personally identified VM 

## 2011-11-17 ENCOUNTER — Encounter: Payer: Self-pay | Admitting: Family Medicine

## 2011-11-17 ENCOUNTER — Ambulatory Visit (INDEPENDENT_AMBULATORY_CARE_PROVIDER_SITE_OTHER): Payer: BC Managed Care – PPO | Admitting: Family Medicine

## 2011-11-17 VITALS — BP 122/88 | Temp 97.7°F | Wt 180.0 lb

## 2011-11-17 DIAGNOSIS — R059 Cough, unspecified: Secondary | ICD-10-CM

## 2011-11-17 DIAGNOSIS — R05 Cough: Secondary | ICD-10-CM

## 2011-11-17 MED ORDER — HYDROCOD POLST-CHLORPHEN POLST 10-8 MG/5ML PO LQCR: 5.0000 mL | Freq: Two times a day (BID) | ORAL | Status: DC

## 2011-11-17 NOTE — Patient Instructions (Signed)

## 2011-11-17 NOTE — Progress Notes (Signed)
  Subjective:    Patient ID: Rebecca Reid, female    DOB: 05/28/1946, 66 y.o.   MRN: [id number]  HPI  Recurrent cough. She was seen in March with cough and that illness did resolve. This cough started last Friday. Dry cough. Severe at times. She denies associated fever. No nasal congestion or postnasal drip. She has long history of GERD treated with Prilosec. No active reflux symptoms. She tried [employer] without relief. Had some leftover Tussionex which did help. Denies any pleuritic pain or hemoptysis. No wheezing. No history of reactive airway disease. Never smoked. No appetite or weight changes.  Past Medical History  Diagnosis Date  . HYPERLIPIDEMIA 04/30/2009  . HYPERTENSION 04/30/2009  . ALLERGIC RHINITIS CAUSE UNSPECIFIED 04/30/2009  . GERD 04/30/2009  . OSTEOPENIA 04/30/2009  . LEG EDEMA 12/12/2009  . Dysuria 05/06/2010  . DEPRESSION, HX OF 04/11/2010   Past Surgical History  Procedure Date  . Breast enhancement surgery 1987  . Cholecystectomy 1974  . Abdominal hysterectomy 1986  . Breast surgery 1974    biopsy    reports that she has never smoked. She does not have any smokeless tobacco history on file. Her alcohol and drug histories not on file. family history includes Arthritis in her other; Cancer in her other; Emphysema in her mother; Hyperlipidemia in her other; and Hypertension in her other. Allergies  Allergen Reactions  . Erythromycin Base     REACTION: yeast infection, GI upset  . Naproxen     REACTION: GI upset  . Prednisone     REACTION: hives, anxiety, insomnia      Review of Systems  Constitutional: Negative for fever, chills and unexpected weight change.  HENT: Negative for congestion and postnasal drip.   Respiratory: Positive for cough. Negative for shortness of breath and wheezing.   Cardiovascular: Negative for chest pain, palpitations and leg swelling.  Neurological: Negative for headaches.       Objective:   Physical Exam    Constitutional: She appears well-developed and well-nourished.  HENT:  Right Ear: External ear normal.  Left Ear: External ear normal.  Mouth/Throat: Oropharynx is clear and moist.  Neck: Neck supple.  Cardiovascular: Normal rate and regular rhythm.   Pulmonary/Chest: Effort normal and breath sounds normal. No respiratory distress. She has no wheezes. She has no rales.  Lymphadenopathy:    She has no cervical adenopathy.          Assessment & Plan:  Cough. Suspect acute viral bronchitis. Non focal exam. Refill Tussionex for as needed use. Work note written for 3 days. Follow up promptly for fever or worsening symptoms

## 2011-11-19 ENCOUNTER — Telehealth: Payer: Self-pay | Admitting: *Deleted

## 2011-11-19 MED ORDER — DOXYCYCLINE HYCLATE 100 MG PO TABS: 100.0000 mg | ORAL_TABLET | Freq: Two times a day (BID) | ORAL | Status: AC

## 2011-11-19 NOTE — Telephone Encounter (Signed)
Notified pt. 

## 2011-11-19 NOTE — Telephone Encounter (Signed)
Add Doxycycline 100 mg po bid for 7 days and follow up promptly for fever or worsening symptoms.

## 2011-11-19 NOTE — Telephone Encounter (Signed)
Pt wants Dr. Caryl Never to know she now has a productive (green, yellow)  Cough. No fever, but does have continued SOB,

## 2011-11-20 ENCOUNTER — Telehealth: Payer: Self-pay | Admitting: *Deleted

## 2011-11-20 NOTE — Telephone Encounter (Signed)
Should be OK 

## 2011-11-20 NOTE — Telephone Encounter (Signed)
Pt asking about going back to work tomorrow and if she is contagious.  Advised her if she is not and has not run any fever in 48-72 hours, she should not be contagious.  Just be careful, and not eat, drink or be in close contact with everyone.

## 2011-11-21 ENCOUNTER — Telehealth: Payer: Self-pay | Admitting: *Deleted

## 2011-11-21 NOTE — Telephone Encounter (Signed)
Should not be contagious per Dr. Kyung Rudd.

## 2011-11-27 ENCOUNTER — Telehealth: Payer: Self-pay | Admitting: *Deleted

## 2011-11-27 NOTE — Telephone Encounter (Signed)
LMTCB in the am for her questions. Asking for a different antibiotic and something to open hug.

## 2011-11-28 ENCOUNTER — Encounter: Payer: Self-pay | Admitting: Family Medicine

## 2011-11-28 ENCOUNTER — Ambulatory Visit (INDEPENDENT_AMBULATORY_CARE_PROVIDER_SITE_OTHER): Payer: BC Managed Care – PPO | Admitting: Family Medicine

## 2011-11-28 VITALS — BP 150/100 | Temp 98.0°F | Wt 182.0 lb

## 2011-11-28 DIAGNOSIS — R05 Cough: Secondary | ICD-10-CM

## 2011-11-28 DIAGNOSIS — R059 Cough, unspecified: Secondary | ICD-10-CM

## 2011-11-28 MED ORDER — METHYLPREDNISOLONE ACETATE 80 MG/ML IJ SUSP
80.0000 mg | Freq: Once | INTRAMUSCULAR | Status: AC
  Administered 2011-11-28: 80 mg via INTRAMUSCULAR

## 2011-11-28 MED ORDER — ALBUTEROL SULFATE HFA 108 (90 BASE) MCG/ACT IN AERS: 2.0000 | INHALATION_SPRAY | Freq: Four times a day (QID) | RESPIRATORY_TRACT | Status: DC | PRN

## 2011-11-28 NOTE — Telephone Encounter (Signed)
Consider over-the-counter Mucinex. If she has any indicators of ongoing infection such as fever needs to be reassessed. Doxycycline usually has fairly broad-spectrum coverage for the lung

## 2011-11-28 NOTE — Telephone Encounter (Signed)
Pt coming in for reassessment today

## 2011-11-28 NOTE — Progress Notes (Signed)
  Subjective:    Patient ID: Rebecca Reid, female    DOB: [birth year], 66 y.o.   MRN: [id number]  HPI  Ongoing cough for 4 weeks duration. Increased fatigue past few weeks. Never smoked. Currently on doxycycline. Using Tussionex at night which helps cough. She's had possibly some mild wheezing off and on past week. No history of asthma. Takes losartan HCTZ for hypertension. No postnasal drip symptoms. History of GERD treated with Prilosec. Occasionally wakes up with sour taste in mouth but no active GERD symptoms. Denies any fever, chills, appetite or weight changes, hemoptysis, or any pleuritic pain. No dyspnea with exertion.   Review of Systems  Constitutional: Positive for fatigue. Negative for fever, chills and unexpected weight change.  HENT: Negative for congestion.   Respiratory: Positive for cough. Negative for wheezing.   Neurological: Negative for dizziness.       Objective:   Physical Exam  Constitutional: She appears well-developed and well-nourished.  HENT:  Mouth/Throat: Oropharynx is clear and moist.  Cardiovascular: Normal rate and regular rhythm.   Pulmonary/Chest: Effort normal and breath sounds normal. No respiratory distress. She has no wheezes. She has no rales.          Assessment & Plan:  Persistent cough. Suspect initial viral process. Possible reactive airway component. Proventil inhaler 2 puffs every 6 hours when necessary. Depo-Medrol 80 mg IM given. Patient previously intolerant of oral prednisone. Touch base in 2 weeks if cough not resolving. She'll continue Tussionex as needed for nighttime cough

## 2011-11-28 NOTE — Patient Instructions (Signed)
Touch base in 2 weeks if cough no better and sooner for any fever, shortness of breath, or worsening cough Consider elevate head of bed 6-8 inches. Continue with Prilosec.

## 2011-12-03 ENCOUNTER — Other Ambulatory Visit: Payer: Self-pay | Admitting: Family Medicine

## 2011-12-03 NOTE — Telephone Encounter (Signed)
OK to refill

## 2011-12-04 ENCOUNTER — Telehealth: Payer: Self-pay | Admitting: *Deleted

## 2011-12-04 MED ORDER — AMLODIPINE BESYLATE 5 MG PO TABS: 5.0000 mg | ORAL_TABLET | Freq: Every day | ORAL | Status: DC

## 2011-12-04 NOTE — Telephone Encounter (Signed)
Continue Hyzaar and add amlodipine 5 mg once daily and reassess blood pressure one month

## 2011-12-04 NOTE — Telephone Encounter (Signed)
Pt's wants Dr Caryl Never to know her BP is averaging 150/92.  Any new suggestions?

## 2011-12-04 NOTE — Telephone Encounter (Signed)
Notified pt. 

## 2011-12-29 ENCOUNTER — Other Ambulatory Visit: Payer: Self-pay | Admitting: *Deleted

## 2011-12-29 DIAGNOSIS — I10 Essential (primary) hypertension: Secondary | ICD-10-CM

## 2011-12-29 MED ORDER — LOSARTAN POTASSIUM-HCTZ 100-25 MG PO TABS: 1.0000 | ORAL_TABLET | Freq: Every day | ORAL | Status: DC

## 2012-01-23 ENCOUNTER — Other Ambulatory Visit: Payer: Self-pay | Admitting: Family Medicine

## 2012-03-31 ENCOUNTER — Ambulatory Visit (INDEPENDENT_AMBULATORY_CARE_PROVIDER_SITE_OTHER): Payer: Medicare Other | Admitting: Family Medicine

## 2012-03-31 ENCOUNTER — Encounter: Payer: Self-pay | Admitting: Family Medicine

## 2012-03-31 DIAGNOSIS — K219 Gastro-esophageal reflux disease without esophagitis: Secondary | ICD-10-CM

## 2012-03-31 DIAGNOSIS — M899 Disorder of bone, unspecified: Secondary | ICD-10-CM

## 2012-03-31 DIAGNOSIS — Z8659 Personal history of other mental and behavioral disorders: Secondary | ICD-10-CM

## 2012-03-31 DIAGNOSIS — R5383 Other fatigue: Secondary | ICD-10-CM

## 2012-03-31 DIAGNOSIS — E785 Hyperlipidemia, unspecified: Secondary | ICD-10-CM

## 2012-03-31 DIAGNOSIS — Z23 Encounter for immunization: Secondary | ICD-10-CM

## 2012-03-31 DIAGNOSIS — R5381 Other malaise: Secondary | ICD-10-CM

## 2012-03-31 DIAGNOSIS — I10 Essential (primary) hypertension: Secondary | ICD-10-CM

## 2012-03-31 DIAGNOSIS — Z0000 Encounter for general adult medical examination without abnormal findings: Secondary | ICD-10-CM

## 2012-03-31 LAB — CBC WITH DIFFERENTIAL/PLATELET
Basophils Absolute: 0 10*3/uL (ref 0.0–0.1)
Basophils Relative: 0.4 % (ref 0.0–3.0)
Eosinophils Absolute: 0.1 10*3/uL (ref 0.0–0.7)
MCHC: 32.8 g/dL (ref 30.0–36.0)
MCV: 84.9 fl (ref 78.0–100.0)
Monocytes Absolute: 0.5 10*3/uL (ref 0.1–1.0)
Neutro Abs: 4.2 10*3/uL (ref 1.4–7.7)
Neutrophils Relative %: 69 % (ref 43.0–77.0)
RBC: 4.54 Mil/uL (ref 3.87–5.11)
RDW: 14.9 % — ABNORMAL HIGH (ref 11.5–14.6)

## 2012-03-31 MED ORDER — SERTRALINE HCL 100 MG PO TABS: 100.0000 mg | ORAL_TABLET | Freq: Every day | ORAL | Status: DC

## 2012-03-31 NOTE — Progress Notes (Signed)
Subjective:    Patient ID: Rebecca Reid, female    DOB: 1946/01/11, 66 y.o.   MRN: [id number]  HPI  Patient here for welcome to Medicare exam. She is also here for followup regarding chronic medical problems including hypertension, hyperlipidemia, history depression, GERD, and reported history of osteopenia. No DEXA scan in several years. She had previous hysterectomy so no indication for Pap smear. She's not had colonoscopy in several years. She thinks over 10 years. No history of shingles vaccine. Previous Pneumovax but probably over 5 years ago. No flu vaccine yet. Last mammogram 2 years ago. Medications reviewed. Patient compliant with all. She wishes to taper back her Zoloft. Depression is currently stable. BP stable.  No orthostasis.  GERD symptoms stable on omeprazole.  Past Medical History  Diagnosis Date  . HYPERLIPIDEMIA 04/30/2009  . HYPERTENSION 04/30/2009  . ALLERGIC RHINITIS CAUSE UNSPECIFIED 04/30/2009  . GERD 04/30/2009  . OSTEOPENIA 04/30/2009  . LEG EDEMA 12/12/2009  . Dysuria 05/06/2010  . DEPRESSION, HX OF 04/11/2010   Past Surgical History  Procedure Date  . Breast enhancement surgery 1987  . Cholecystectomy 1974  . Abdominal hysterectomy 1986  . Breast surgery 1974    biopsy    reports that she has never smoked. She does not have any smokeless tobacco history on file. Her alcohol and drug histories not on file. family history includes Arthritis in her other; Cancer in her other; Emphysema in her mother; Heart disease (age of onset:65) in her father; Heart disease (age of onset:70) in her mother; Hyperlipidemia in her other; and Hypertension in her other. Allergies  Allergen Reactions  . Erythromycin Base     REACTION: yeast infection, GI upset  . Naproxen     REACTION: GI upset  . Prednisone     REACTION: hives, anxiety, insomnia   1.  Risk factors based on Past Medical , Social, and Family history reviewed and as above 2.  Limitations in physical  activities walks for exercise. Low risk for fall 3.  Depression/mood mood stable. Patient will taper back sertraline with instructions given 4.  Hearing no major deficits 5.  ADLs independent in all 6.  Cognitive function (orientation to time and place, language, writing, speech,memory) memory intact. Language and judgment intact 7.  Home Safety no issues 8.  Height, weight, and visual acuity. Some recent mild weight loss due to her efforts. 9.  Counseling discussed diet and exercise. 10. Recommendation of preventive services. Check on coverage for colonoscopy and shingles vaccine. Flu and Pneumovax given. Schedule mammogram. Consider repeat DEXA scan 11. Labs based on risk factors lipid, hepatic, basic metabolic panel, TSH, and CBC 12. Care Plan as above. 13.  Advanced directives-pt has living will and durable POA.    Review of Systems  Constitutional: Positive for fatigue. Negative for fever, activity change, appetite change and unexpected weight change.  HENT: Negative for hearing loss, ear pain, sore throat and trouble swallowing.   Eyes: Negative for visual disturbance.  Respiratory: Negative for cough, chest tightness and shortness of breath.   Cardiovascular: Negative for chest pain and palpitations.  Gastrointestinal: Negative for abdominal pain, diarrhea, constipation and blood in stool.  Genitourinary: Negative for dysuria and hematuria.  Musculoskeletal: Negative for myalgias, back pain and arthralgias.  Skin: Negative for rash.  Neurological: Negative for dizziness, syncope and headaches.  Hematological: Negative for adenopathy.  Psychiatric/Behavioral: Negative for confusion and dysphoric mood.       Objective:   Physical Exam  Constitutional: She is  oriented to person, place, and time. She appears well-developed and well-nourished.  HENT:  Head: Normocephalic and atraumatic.  Eyes: EOM are normal. Pupils are equal, round, and reactive to light.  Neck: Normal range  of motion. Neck supple. No thyromegaly present.  Cardiovascular: Normal rate, regular rhythm and normal heart sounds.   No murmur heard. Pulmonary/Chest: Breath sounds normal. No respiratory distress. She has no wheezes. She has no rales.  Abdominal: Soft. Bowel sounds are normal. She exhibits no distension and no mass. There is no tenderness. There is no rebound and no guarding.  Genitourinary:       Breasts without mass  And no nipple inversion.  Musculoskeletal: Normal range of motion. She exhibits no edema.  Lymphadenopathy:    She has no cervical adenopathy.  Neurological: She is alert and oriented to person, place, and time. She displays normal reflexes. No cranial nerve deficit.  Skin: No rash noted.  Psychiatric: She has a normal mood and affect. Her behavior is normal. Judgment and thought content normal.          Assessment & Plan:  #1 health maintenance. Flu vaccine and Pneumovax given.  She will check on coverage for colonoscopy and shingles vaccine as well as DEXA scan. Schedule mammogram. Hemoccult cards given. Obtain baseline EKG. Diet and exercise discussed. Discussed appropriate levels of calcium and vitamin D supplementation  #2 history of osteopenia. Calcium and vitamin D as above. Check on coverage for repeat DEXA scan #3 hyperlipidemia. Check lipid and hepatic panel  #4 history of depression currently stable. Taper off sertraline 50 mg daily for 2-3 weeks and consider discontinuing  #5 history of GERD stable continue current medication  #6 hypertension stable at goal. Continue losartan HCTZ. Check basic metabolic panel.  #7 fatigue. Check TSH and CBC.  EKG shows normal sinus rhythm. No acute findings.

## 2012-03-31 NOTE — Patient Instructions (Addendum)
Check on insurance coverage for colonoscopy Schedule repeat mammogram Check on coverage for bone density scan and we can schedule this Continue weight loss efforts. Continue yearly flu vaccine Calcium 1200 mg daily and vitamin D 800 international units daily

## 2012-04-01 LAB — HEPATIC FUNCTION PANEL
ALT: 12 U/L (ref 0–35)
Total Bilirubin: 0.8 mg/dL (ref 0.3–1.2)
Total Protein: 7.1 g/dL (ref 6.0–8.3)

## 2012-04-01 LAB — LIPID PANEL
Cholesterol: 250 mg/dL — ABNORMAL HIGH (ref 0–200)
HDL: 70.4 mg/dL (ref >39.00)
Triglycerides: 71 mg/dL (ref 0.0–149.0)
VLDL: 14.2 mg/dL (ref 0.0–40.0)

## 2012-04-01 LAB — BASIC METABOLIC PANEL
Calcium: 9.3 mg/dL (ref 8.4–10.5)
GFR: 75.39 mL/min (ref >60.00)
Glucose, Bld: 91 mg/dL (ref 70–99)
Potassium: 3.5 mEq/L (ref 3.5–5.1)
Sodium: 140 mEq/L (ref 135–145)

## 2012-04-02 LAB — LDL CHOLESTEROL, DIRECT: Direct LDL: 159.8 mg/dL

## 2012-04-06 ENCOUNTER — Encounter: Payer: Self-pay | Admitting: *Deleted

## 2012-04-06 NOTE — Progress Notes (Signed)
Quick Note:  Labs mailed to pt home, VM on cell phone ______

## 2012-04-12 ENCOUNTER — Telehealth: Payer: Self-pay | Admitting: Family Medicine

## 2012-04-12 DIAGNOSIS — Z8739 Personal history of other diseases of the musculoskeletal system and connective tissue: Secondary | ICD-10-CM

## 2012-04-12 NOTE — Telephone Encounter (Signed)
Pt informed order sent on VM

## 2012-04-12 NOTE — Telephone Encounter (Signed)
Pt would like a bone density test done at [facility]. Pt stated her Ins will cover. Pt will also get mammogram at same location.

## 2012-04-19 ENCOUNTER — Telehealth: Payer: Self-pay | Admitting: Family Medicine

## 2012-04-19 DIAGNOSIS — Z1239 Encounter for other screening for malignant neoplasm of breast: Secondary | ICD-10-CM

## 2012-04-19 DIAGNOSIS — M899 Disorder of bone, unspecified: Secondary | ICD-10-CM

## 2012-04-19 DIAGNOSIS — Z1231 Encounter for screening mammogram for malignant neoplasm of breast: Secondary | ICD-10-CM

## 2012-04-19 NOTE — Telephone Encounter (Signed)
[facility] called and needs an order on this pt changed. The Bone Density order is currently for HM Dexa Scan. Per Baird Lyons, it needs to be for: Dexa Scan only (IMG# is: [id number]). Patient called them to sched the scan , but they can't sched until order is fixed. Thanks.

## 2012-04-19 NOTE — Telephone Encounter (Signed)
Order done, pt informed

## 2012-04-19 NOTE — Telephone Encounter (Signed)
Patient called stating that upon trying to [facility] she was told that the order was not sent to them but to [facility]. Please assist and send the order to [facility] and inform patient when done.

## 2012-04-20 ENCOUNTER — Other Ambulatory Visit: Payer: Self-pay | Admitting: Family Medicine

## 2012-05-26 ENCOUNTER — Ambulatory Visit
Admission: RE | Admit: 2012-05-26 | Discharge: 2012-05-26 | Disposition: A | Discharge status: Home / Self Care | Payer: Medicare Other | Source: Ambulatory Visit | Attending: Family Medicine | Admitting: Family Medicine

## 2012-05-26 DIAGNOSIS — Z1231 Encounter for screening mammogram for malignant neoplasm of breast: Secondary | ICD-10-CM

## 2012-05-26 DIAGNOSIS — M899 Disorder of bone, unspecified: Secondary | ICD-10-CM

## 2012-05-27 NOTE — Progress Notes (Signed)
Quick Note:  Pt informed, voiced understanding ______

## 2012-10-13 ENCOUNTER — Ambulatory Visit: Payer: Medicare Other | Admitting: Family Medicine

## 2012-11-04 ENCOUNTER — Ambulatory Visit (INDEPENDENT_AMBULATORY_CARE_PROVIDER_SITE_OTHER): Payer: Medicare Other | Admitting: Family Medicine

## 2012-11-04 DIAGNOSIS — Z23 Encounter for immunization: Secondary | ICD-10-CM

## 2012-11-04 DIAGNOSIS — Z299 Encounter for prophylactic measures, unspecified: Secondary | ICD-10-CM

## 2012-11-15 ENCOUNTER — Ambulatory Visit: Payer: Medicare Other | Admitting: Family Medicine

## 2013-01-05 ENCOUNTER — Ambulatory Visit: Payer: Medicare Other | Admitting: Family Medicine

## 2013-01-13 ENCOUNTER — Ambulatory Visit: Payer: Medicare Other | Admitting: Family Medicine

## 2013-02-01 ENCOUNTER — Other Ambulatory Visit: Payer: Self-pay | Admitting: Family Medicine

## 2013-03-24 ENCOUNTER — Encounter: Payer: Self-pay | Admitting: Family Medicine

## 2013-03-24 ENCOUNTER — Ambulatory Visit (INDEPENDENT_AMBULATORY_CARE_PROVIDER_SITE_OTHER): Payer: Medicare Other | Admitting: Family Medicine

## 2013-03-24 VITALS — BP 110/82 | Temp 97.7°F | Wt 174.0 lb

## 2013-03-24 DIAGNOSIS — M5410 Radiculopathy, site unspecified: Secondary | ICD-10-CM

## 2013-03-24 DIAGNOSIS — IMO0002 Reserved for concepts with insufficient information to code with codable children: Secondary | ICD-10-CM

## 2013-03-24 MED ORDER — TRAMADOL HCL 50 MG PO TABS: 50.0000 mg | ORAL_TABLET | Freq: Three times a day (TID) | ORAL | Status: DC | PRN

## 2013-03-24 NOTE — Patient Instructions (Signed)
-  We placed a referral for you as discussed to the specialist regarding your pain for further evaluation and treatment. It usually takes about 1-2 weeks to process and schedule this referral. If you have not heard from [place] regarding this appointment in 2 weeks please contact our office.  -gentle activity is ok  -use medication if needed sparingly for pain  -follow up as needed

## 2013-03-24 NOTE — Progress Notes (Signed)
Chief Complaint  Patient presents with  . Left leg pain    raidiates to foot     HPI:  Rebecca Reid, a 67 yo patient of Dr. Caryl Never, is here for an acute visit for leg pain: -L leg pain started about 1 year ago  - has been on and off frequently  -had been doing ok, but had flare of this after getting up this morning -pain is sharp, throbbing and starts in L upper back and radiates down the back of leg to calf -now a little better, then this morning -denies: fevers, chills, weakness, numbness    ROS: See pertinent positives and negatives per HPI.  Past Medical History  Diagnosis Date  . HYPERLIPIDEMIA 04/30/2009  . HYPERTENSION 04/30/2009  . ALLERGIC RHINITIS CAUSE UNSPECIFIED 04/30/2009  . GERD 04/30/2009  . OSTEOPENIA 04/30/2009  . LEG EDEMA 12/12/2009  . Dysuria 05/06/2010  . DEPRESSION, HX OF 04/11/2010    Past Surgical History  Procedure Laterality Date  . Breast enhancement surgery  1987  . Cholecystectomy  1974  . Abdominal hysterectomy  1986  . Breast surgery  1974    biopsy    Family History  Problem Relation Age of Onset  . Emphysema Mother     heavy smoker  . Heart disease Mother 73  . Arthritis Other   . Hyperlipidemia Other   . Hypertension Other   . Cancer Other     prostate  . Heart disease Father 56    History   Social History  . Marital Status: Divorced    Spouse Name: N/A    Number of Children: N/A  . Years of Education: N/A   Social History Main Topics  . Smoking status: Never Smoker   . Smokeless tobacco: None  . Alcohol Use: None  . Drug Use: None  . Sexual Activity: None   Other Topics Concern  . None   Social History Narrative  . None    Current outpatient prescriptions:aspirin 81 MG tablet, Take 81 mg by mouth daily.  , Disp: , Rfl: ;  losartan-hydrochlorothiazide (HYZAAR) 100-25 MG per tablet, TAKE 1 TABLET BY MOUTH DAILY., Disp: 30 tablet, Rfl: 5;  omeprazole (PRILOSEC) 20 MG capsule, Take 20 mg by mouth 2 (two)  times daily. , Disp: , Rfl: ;  sertraline (ZOLOFT) 100 MG tablet, Take 1 tablet (100 mg total) by mouth daily., Disp: 90 tablet, Rfl: 2 sertraline (ZOLOFT) 100 MG tablet, Take 1 tablet (100 mg total) by mouth daily., Disp: 90 tablet, Rfl: 3;  triamcinolone (NASACORT) 55 MCG/ACT nasal inhaler, Place 2 sprays into the nose as needed. , Disp: , Rfl: ;  albuterol (PROVENTIL HFA;VENTOLIN HFA) 108 (90 BASE) MCG/ACT inhaler, Inhale 2 puffs into the lungs every 6 (six) hours as needed for wheezing., Disp: 1 Inhaler, Rfl: 0 traMADol (ULTRAM) 50 MG tablet, Take 1 tablet (50 mg total) by mouth every 8 (eight) hours as needed for pain., Disp: 30 tablet, Rfl: 0  EXAM:  Filed Vitals:   03/24/13 1104  BP: 110/82  Temp: 97.7 F (36.5 C)    Body mass index is 28.3 kg/(m^2).  GENERAL: vitals reviewed and listed above, alert, oriented, appears well hydrated and in no acute distress  HEENT: atraumatic, conjunttiva clear, no obvious abnormalities on inspection of external nose and ears  NECK: no obvious masses on inspection  MS: moves all extremities without noticeable abnormality  Normal Gait Normal inspection of back, no obvious scoliosis or leg length descrepancy No bony  TTP Soft tissue TTP at: L lumbar lower paraspinal muscles -/+ tests: neg trendelenburg,-facet loading, -SLRT, -CLRT, -FABER, -FADIR Normal muscle strength, sensation to light touch and DTRs in LEs bilaterally  PSYCH: pleasant and cooperative, no obvious depression or anxiety  ASSESSMENT AND PLAN:  Discussed the following assessment and plan:  Radiculopathy - Plan: Ambulatory referral to Physical Medicine Rehab, traMADol (ULTRAM) 50 MG tablet  -sounds like radicular pain and she is tired of dealing with this -discussed options and offered trial prednisone, would prefer to see PMR and hold off on prednisone - referral placed, tramadol to use in interim Recommendations per orders and instructions, risks and use of medications and  return precautions discussed. -Patient advised to return or notify a doctor immediately if symptoms worsen or persist or new concerns arise.  Patient Instructions  -We placed a referral for you as discussed to the specialist regarding your pain for further evaluation and treatment. It usually takes about 1-2 weeks to process and schedule this referral. If you have not heard from [place] regarding this appointment in 2 weeks please contact our office.  -gentle activity is ok  -use medication if needed sparingly for pain  -follow up as needed      KIM, HANNAH R.

## 2013-03-28 ENCOUNTER — Telehealth: Payer: Self-pay | Admitting: Family Medicine

## 2013-03-28 NOTE — Telephone Encounter (Signed)
Spoke with pt, she is aware that it can take several weeks to be set up with pain management however pt is in a lot of pain every morning. Pt states that when she was in to see Dr. Selena Batten she was prescribed Tramadol and she was afraid to take it because she just started a job. Pt also stated that she has Hydrocodone that she has at home that she has been taking but it is not helping either. I offered pt an appt today to come in and see Dr. Caryl Never but she had to be to work at 5 and could not come in. Pt also was concerned that Dr. Caryl Never might not be able to give her anything that would help with the pain. Pt requested to be contacted. PT leaves for work at $:30 and is hoping for a call back before then.

## 2013-03-28 NOTE — Telephone Encounter (Signed)
PT called back and stated that she had just missed your call. She states that she can be reached at [phone] until 4:30pm. Please assist.

## 2013-03-28 NOTE — Telephone Encounter (Signed)
Patient Information:  Caller Name: Sanah  Phone: [phone]  Patient: Rebecca Reid, Rebecca Reid  Gender: Female  DOB: 08/29/1945  Age: 67 Years  PCP: Evelena Peat (Family Practice)  Office Follow Up:  Does the office need to follow up with this patient?: Yes  Instructions For The Office: Requesting status of referral to [facility]   Symptoms  Reason For Call & Symptoms: Zayonna states she was seen in office on 03/24/13 due to leg pain. States she is being referred to [facility]. Has not heard from Lacey Jensen regarding referral. Shakara called [facility] who explained to Kaisyn she can not schedule appointment- needs referral from [facility]. Would like to be seen ASAP due to severe leg pain.  Eli requesting callback from Referral Co-ordinator regarding status of her referral. Alizee can be reached at [phone] until 16:30. May leave message at [phone] after 16:30.  Reviewed Health History In EMR: Yes  Reviewed Medications In EMR: Yes  Reviewed Allergies In EMR: Yes  Reviewed Surgeries / Procedures: Yes  Date of Onset of Symptoms: 03/21/2013  Treatments Tried: Warm bath with Epsom Salts- Declined of Prednisone  Treatments Tried Worked: No  Guideline(s) Used:  No Protocol Available - Sick Adult  Disposition Per Guideline:   Discuss with PCP and Callback by Nurse Today  Reason For Disposition Reached:   Nursing judgment  Advice Given:  N/A  Patient Will Follow Care Advice:  YES

## 2013-03-28 NOTE — Telephone Encounter (Signed)
Pt states she is experiencing extreme pain and needs to know status of referall to pain center.

## 2013-03-28 NOTE — Telephone Encounter (Signed)
I have called pt and left message twice.

## 2013-03-30 NOTE — Telephone Encounter (Signed)
I left message for patient on 03-28-13 to call back and have not heard back.  We offered to see her back if necessary before she gets in to see specialist.

## 2013-03-31 ENCOUNTER — Encounter: Payer: Self-pay | Admitting: Family Medicine

## 2013-03-31 ENCOUNTER — Ambulatory Visit (INDEPENDENT_AMBULATORY_CARE_PROVIDER_SITE_OTHER): Payer: Medicare Other | Admitting: Family Medicine

## 2013-03-31 VITALS — BP 126/70 | HR 72 | Temp 98.1°F | Wt 177.0 lb

## 2013-03-31 DIAGNOSIS — Z23 Encounter for immunization: Secondary | ICD-10-CM

## 2013-03-31 MED ORDER — HYDROCODONE-ACETAMINOPHEN 5-325 MG PO TABS: ORAL_TABLET | ORAL | Status: DC

## 2013-03-31 NOTE — Patient Instructions (Addendum)

## 2013-03-31 NOTE — Progress Notes (Signed)
  Subjective:    Patient ID: Rebecca Reid, female    DOB: 1946-06-08, 67 y.o.   MRN: [id number]  HPI Patient seen with left lumbar radiculopathy symptoms. She's had some symptoms off and on of left lower extremity and lumbar pain for the past year but especially over the past couple of weeks. Symptoms started around 8/31.  She describes a sharp pain which is worse with sitting and somewhat intermittent. Location is left buttock all the way down to left lateral foot. She has some mild numbness involving the left lower leg. Denies any clear weakness. No loss of bladder or bowel control.  Recently prescribed tramadol but she never filled prescription. She's used occasional hydrocodone from her husband which is helped. She's using this currently only at night. Her pain is moderate to severe intensity at times. Recently went to chiropractor. X-rays revealed reported degenerative changes. She has referral to physical therapy but has not yet gone.  Past Medical History  Diagnosis Date  . HYPERLIPIDEMIA 04/30/2009  . HYPERTENSION 04/30/2009  . ALLERGIC RHINITIS CAUSE UNSPECIFIED 04/30/2009  . GERD 04/30/2009  . OSTEOPENIA 04/30/2009  . LEG EDEMA 12/12/2009  . Dysuria 05/06/2010  . DEPRESSION, HX OF 04/11/2010   Past Surgical History  Procedure Laterality Date  . Breast enhancement surgery  1987  . Cholecystectomy  1974  . Abdominal hysterectomy  1986  . Breast surgery  1974    biopsy    reports that she has never smoked. She does not have any smokeless tobacco history on file. Her alcohol and drug histories are not on file. family history includes Arthritis in her other; Cancer in her other; Emphysema in her mother; Heart disease (age of onset: 34) in her father; Heart disease (age of onset: 52) in her mother; Hyperlipidemia in her other; Hypertension in her other. Allergies  Allergen Reactions  . Erythromycin Base     REACTION: yeast infection, GI upset  . Naproxen     REACTION: GI upset   . Prednisone     REACTION: hives, anxiety, insomnia      Review of Systems  Constitutional: Negative for fever, chills, appetite change and unexpected weight change.  Gastrointestinal: Negative for abdominal pain.  Genitourinary: Negative for dysuria.  Musculoskeletal: Negative for back pain.  Skin: Negative for rash.  Neurological: Positive for numbness. Negative for weakness.       Objective:   Physical Exam  Constitutional: She appears well-developed and well-nourished.  Cardiovascular: Normal rate.   Pulmonary/Chest: Effort normal and breath sounds normal. No respiratory distress. She has no wheezes. She has no rales.  Musculoskeletal: She exhibits no edema.  Negative straight leg raises. Left calf is nontender to palpation. Good distal foot pulses.  Neurological:  Patient has full-strength lower extremities. 2+ reflexes knee and 2+ right ankle. Trace to 1+ reflex left ankle. Minimal sensory impairment left lateral leg          Assessment & Plan:  Left lumbar radiculopathy probably involving L5-S1 nerve root. She does have slightly diminished left ankle reflex but preserved strength. Continue with physical therapy which she has not yet initiated. We again addressed the issue of prednisone but she is very reluctant to try this. We gave limited prescription for Vicodin 5 mg #30 with no refill 1-2 every 6 hours for severe pain which she will use at night. If no better in 2 weeks with therapy consider MRI scan to further assess

## 2013-04-01 ENCOUNTER — Encounter: Payer: Self-pay | Admitting: Physical Medicine & Rehabilitation

## 2013-04-01 ENCOUNTER — Encounter: Payer: Self-pay | Admitting: Family Medicine

## 2013-04-04 ENCOUNTER — Encounter: Payer: Self-pay | Admitting: Family Medicine

## 2013-04-19 ENCOUNTER — Encounter: Payer: Self-pay | Admitting: Family Medicine

## 2013-04-19 ENCOUNTER — Encounter: Payer: Self-pay | Admitting: Physical Medicine & Rehabilitation

## 2013-04-19 ENCOUNTER — Encounter: Payer: Medicare Other | Attending: Physical Medicine & Rehabilitation

## 2013-04-19 ENCOUNTER — Ambulatory Visit (HOSPITAL_BASED_OUTPATIENT_CLINIC_OR_DEPARTMENT_OTHER): Payer: Medicare Other | Admitting: Physical Medicine & Rehabilitation

## 2013-04-19 VITALS — BP 141/79 | HR 72 | Resp 14 | Ht 66.0 in | Wt 177.0 lb

## 2013-04-19 DIAGNOSIS — M79609 Pain in unspecified limb: Secondary | ICD-10-CM | POA: Insufficient documentation

## 2013-04-19 DIAGNOSIS — Z5181 Encounter for therapeutic drug level monitoring: Secondary | ICD-10-CM

## 2013-04-19 DIAGNOSIS — Z79899 Other long term (current) drug therapy: Secondary | ICD-10-CM

## 2013-04-19 DIAGNOSIS — M5410 Radiculopathy, site unspecified: Secondary | ICD-10-CM

## 2013-04-19 DIAGNOSIS — IMO0002 Reserved for concepts with insufficient information to code with codable children: Secondary | ICD-10-CM

## 2013-04-19 DIAGNOSIS — R209 Unspecified disturbances of skin sensation: Secondary | ICD-10-CM | POA: Insufficient documentation

## 2013-04-19 DIAGNOSIS — M25569 Pain in unspecified knee: Secondary | ICD-10-CM | POA: Insufficient documentation

## 2013-04-19 MED ORDER — HYDROCODONE-ACETAMINOPHEN 5-325 MG PO TABS: ORAL_TABLET | ORAL | Status: DC

## 2013-04-19 NOTE — Patient Instructions (Addendum)
Try substituting 800 mg of ibuprofen for a dose of hydrocodone  Please continue chiropractic treatment  I have given  handouts for lumbar exercises, there are 4 exercises. If any of them aggravate her back  may stop that particular exercise but continue the other 3 exercises  EMG in 2 weeks  I recommend no work until reevaluation in 2 weeks.

## 2013-04-19 NOTE — Progress Notes (Signed)
Subjective:    Patient ID: Rebecca Reid, female    DOB: June 10, 1946, 67 y.o.   MRN: [id number]  HPI Onset of left lower extremity pain posterior aspect to the knee and radiating to the lateral aspect of the left foot. Accompanied by numbness of the left foot. Evaluated by primary care physician. Started on hydrocodone taking up to 3 times per day depending on pain level. Did not take prednisone secondary to history of agitation and insomnia, tried in past for sinus infection.  Using heat, medication, TENS unit. Also having chiropractic treatment which includes left hamstring stretches, minor adjustments, doing limited walking because of fear of rolling her left foot. 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 Was able to finish her shift. Pain Inventory Average Pain 8 Pain Right Now 7 My pain is constant, sharp, tingling and aching  In the last 24 hours, has pain interfered with the following? General activity 7 Relation with others 0 Enjoyment of life 9 What TIME of day is your pain at its worst? morning Sleep (in general) Good  Pain is worse with: bending, inactivity and standing Pain improves with: rest, heat/ice, medication and TENS Relief from Meds: 6  Mobility walk without assistance ability to climb steps?  yes do you drive?  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 Emphysema Mother     heavy smoker  . Heart disease Mother 91  . Arthritis Other   . Hyperlipidemia Other   . Hypertension Other   . Cancer Other     prostate  . Heart disease Father 47   History   Social History  . Marital Status: Divorced    Spouse Name: N/A    Number of Children: N/A  . Years of Education: N/A   Social History Main Topics  . Smoking status: Never Smoker   .  Smokeless tobacco: None  . Alcohol Use: None  . Drug Use: None  . Sexual Activity: None   Other Topics Concern  . None   Social History Narrative  . None   Past Surgical History  Procedure Laterality Date  . Breast enhancement surgery  1987  . Cholecystectomy  1974  . Abdominal hysterectomy  1986  . Breast surgery  1974    biopsy   Past Medical History  Diagnosis Date  . HYPERLIPIDEMIA 04/30/2009  . HYPERTENSION 04/30/2009  . ALLERGIC RHINITIS CAUSE UNSPECIFIED 04/30/2009  . GERD 04/30/2009  . OSTEOPENIA 04/30/2009  . LEG EDEMA 12/12/2009  . Dysuria 05/06/2010  . DEPRESSION, HX OF 04/11/2010   BP 141/79  Pulse 72  Resp 14  Ht 5\' 6"  (1.676 m)  Wt 177 lb (80.287 kg)  BMI 28.58 kg/m2  SpO2 97%     Review of Systems  Musculoskeletal: Positive for back pain.  Neurological: Positive for weakness and numbness.  All other systems reviewed and are negative.       Objective:   Physical Exam  Nursing note and vitals reviewed. Constitutional: She is oriented to person, place, and time. She appears well-developed and well-nourished.  HENT:  Head: Normocephalic and atraumatic.  Eyes: EOM are normal. Pupils are equal, round, and reactive to light.  Neck: Normal range of motion. Neck supple.  Musculoskeletal:       Lumbar back:  She exhibits no deformity and no spasm.  Decreased lumbar spine flexion extension lateral rotation and bending. Pain with bending towards the left side.    Neurological: She is alert and oriented to person, place, and time.  Decreased sensation left S1 and left L5 distribution  Psychiatric: She has a normal mood and affect.          Assessment & Plan:  1. Left L5-S1 radiculopathy acute, has sensory deficit L5 and S1. Absent left S1 reflex, decreased strength left gastroc No history of trauma. Hyperextension with onset. Likely foraminal stenosis although a herniated disc cannot be definitely ruled out. Patient is improving. Does not want  surgery. No bowel or bladder dysfunction no progressive weakness. We did discuss the possibility that weakness and foot numbness may not resolve. Patient had bad reaction to prednisone and refuses oral corticosteroid. Will hold off on epidural injection given this history and the fact that she is overall less painful.  Discussed nonsurgical treatment recommendations: #1. Continue chiropractic treatment 2. Add active lumbar and pelvic strengthening exercises 3. Continue hydrocodone 2 or 3 tablets per day however at this point she can try substituting ibuprofen 800 mg for a hydrocodone dose.  4. Have written work excuse for 2 weeks. 5. EMG/and NCV in 2wks to look for signs of reinnervation

## 2013-04-30 ENCOUNTER — Encounter: Payer: Self-pay | Admitting: Physical Medicine & Rehabilitation

## 2013-05-06 ENCOUNTER — Ambulatory Visit (HOSPITAL_BASED_OUTPATIENT_CLINIC_OR_DEPARTMENT_OTHER): Payer: Medicare Other | Admitting: Physical Medicine & Rehabilitation

## 2013-05-06 ENCOUNTER — Encounter: Payer: Medicare Other | Attending: Physical Medicine & Rehabilitation

## 2013-05-06 ENCOUNTER — Encounter: Payer: Self-pay | Admitting: Physical Medicine & Rehabilitation

## 2013-05-06 VITALS — BP 141/76 | HR 81 | Resp 14 | Ht 66.0 in | Wt 178.0 lb

## 2013-05-06 DIAGNOSIS — M79609 Pain in unspecified limb: Secondary | ICD-10-CM | POA: Insufficient documentation

## 2013-05-06 DIAGNOSIS — R209 Unspecified disturbances of skin sensation: Secondary | ICD-10-CM | POA: Insufficient documentation

## 2013-05-06 DIAGNOSIS — IMO0002 Reserved for concepts with insufficient information to code with codable children: Secondary | ICD-10-CM | POA: Insufficient documentation

## 2013-05-06 DIAGNOSIS — M25569 Pain in unspecified knee: Secondary | ICD-10-CM | POA: Insufficient documentation

## 2013-05-06 DIAGNOSIS — M5416 Radiculopathy, lumbar region: Secondary | ICD-10-CM

## 2013-05-06 NOTE — Progress Notes (Signed)
EMG performed 05/06/2013.  See EMG report under media tab.

## 2013-05-06 NOTE — Patient Instructions (Signed)
You have a left S1 radiculopathy. Based on the EMG, I anticipate a good recovery

## 2013-05-18 ENCOUNTER — Other Ambulatory Visit (INDEPENDENT_AMBULATORY_CARE_PROVIDER_SITE_OTHER): Payer: Medicare Other

## 2013-05-18 DIAGNOSIS — Z0000 Encounter for general adult medical examination without abnormal findings: Secondary | ICD-10-CM

## 2013-05-18 DIAGNOSIS — E785 Hyperlipidemia, unspecified: Secondary | ICD-10-CM

## 2013-05-18 DIAGNOSIS — I10 Essential (primary) hypertension: Secondary | ICD-10-CM

## 2013-05-18 LAB — CBC WITH DIFFERENTIAL/PLATELET
Basophils Absolute: 0 10*3/uL (ref 0.0–0.1)
Eosinophils Absolute: 0.2 10*3/uL (ref 0.0–0.7)
HCT: 37.6 % (ref 36.0–46.0)
Hemoglobin: 12.8 g/dL (ref 12.0–15.0)
Lymphs Abs: 1.5 10*3/uL (ref 0.7–4.0)
MCHC: 34 g/dL (ref 30.0–36.0)
MCV: 82.9 fl (ref 78.0–100.0)
Monocytes Absolute: 0.5 10*3/uL (ref 0.1–1.0)
Monocytes Relative: 7 % (ref 3.0–12.0)
Neutro Abs: 5.1 10*3/uL (ref 1.4–7.7)
Platelets: 277 10*3/uL (ref 150.0–400.0)
RDW: 14.5 % (ref 11.5–14.6)

## 2013-05-18 LAB — BASIC METABOLIC PANEL
BUN: 19 mg/dL (ref 6–23)
CO2: 28 mEq/L (ref 19–32)
Glucose, Bld: 88 mg/dL (ref 70–99)
Potassium: 4.3 mEq/L (ref 3.5–5.1)
Sodium: 142 mEq/L (ref 135–145)

## 2013-05-18 LAB — LDL CHOLESTEROL, DIRECT: Direct LDL: 159.5 mg/dL

## 2013-05-18 LAB — HEPATIC FUNCTION PANEL
AST: 17 U/L (ref 0–37)
Albumin: 3.8 g/dL (ref 3.5–5.2)
Total Bilirubin: 0.8 mg/dL (ref 0.3–1.2)

## 2013-05-18 LAB — POCT URINALYSIS DIPSTICK
Blood, UA: NEGATIVE
Glucose, UA: NEGATIVE
Nitrite, UA: NEGATIVE
Urobilinogen, UA: 0.2

## 2013-05-18 LAB — TSH: TSH: 2.97 u[IU]/mL (ref 0.35–5.50)

## 2013-05-18 LAB — LIPID PANEL: Triglycerides: 101 mg/dL (ref 0.0–149.0)

## 2013-05-25 ENCOUNTER — Ambulatory Visit (INDEPENDENT_AMBULATORY_CARE_PROVIDER_SITE_OTHER): Payer: Medicare Other | Admitting: Family Medicine

## 2013-05-25 ENCOUNTER — Encounter: Payer: Self-pay | Admitting: Family Medicine

## 2013-05-25 VITALS — BP 122/74 | HR 83 | Temp 97.7°F | Wt 180.0 lb

## 2013-05-25 DIAGNOSIS — Z0000 Encounter for general adult medical examination without abnormal findings: Secondary | ICD-10-CM

## 2013-05-25 MED ORDER — SERTRALINE HCL 50 MG PO TABS: 50.0000 mg | ORAL_TABLET | Freq: Every day | ORAL | Status: DC

## 2013-05-25 NOTE — Progress Notes (Signed)
Pre-visit discussion using our clinic review tool. No additional management support is needed unless otherwise documented below in the visit note.  

## 2013-05-25 NOTE — Progress Notes (Signed)
Subjective:    Patient ID: Rebecca Reid, female    DOB: [birth year], 67 y.o.   MRN: [id number]  HPI Patient here for complete physical She's had previous hysterectomy. Has hypertension treated with losartan HCTZ. Blood pressure well controlled. Long history of GERD. No recent dysphagia. She takes regular omeprazole.  Recent problems with left lower extremity leg pain. She is seeing pain management. Suspected neuropathic pain from nerve impingement back. No weakness. She has history of depression and anxiety which is controlled with sertraline. Last colonoscopy over 10 years ago. She's not willing to schedule repeat colonoscopy. She is getting every other year mammograms.  Recently had some intermittent ?hot flashes. Previous hysterectomy. Symptoms are very inconsistent.  Past Medical History  Diagnosis Date  . HYPERLIPIDEMIA 04/30/2009  . HYPERTENSION 04/30/2009  . ALLERGIC RHINITIS CAUSE UNSPECIFIED 04/30/2009  . GERD 04/30/2009  . OSTEOPENIA 04/30/2009  . LEG EDEMA 12/12/2009  . Dysuria 05/06/2010  . DEPRESSION, HX OF 04/11/2010   Past Surgical History  Procedure Laterality Date  . Breast enhancement surgery  1987  . Cholecystectomy  1974  . Abdominal hysterectomy  1986  . Breast surgery  1974    biopsy    reports that she has never smoked. She does not have any smokeless tobacco history on file. Her alcohol and drug histories are not on file. family history includes Arthritis in her other; Cancer in her other; Emphysema in her mother; Heart disease (age of onset: 79) in her father; Heart disease (age of onset: 28) in her mother; Hyperlipidemia in her other; Hypertension in her other. Allergies  Allergen Reactions  . Erythromycin Base     REACTION: yeast infection, GI upset  . Naproxen     REACTION: GI upset  . Prednisone     REACTION: hives, anxiety, insomnia      Review of Systems  Constitutional: Negative for fever, activity change, appetite change, fatigue and  unexpected weight change.  HENT: Negative for ear pain, hearing loss, sore throat and trouble swallowing.   Eyes: Negative for visual disturbance.  Respiratory: Negative for cough and shortness of breath.   Cardiovascular: Negative for chest pain and palpitations.  Gastrointestinal: Negative for abdominal pain, diarrhea, constipation and blood in stool.  Genitourinary: Negative for dysuria and hematuria.  Musculoskeletal: Negative for arthralgias, back pain and myalgias.  Skin: Negative for rash.  Neurological: Negative for dizziness, syncope and headaches.  Hematological: Negative for adenopathy.  Psychiatric/Behavioral: Negative for confusion and dysphoric mood.       Objective:   Physical Exam  Constitutional: She is oriented to person, place, and time. She appears well-developed and well-nourished.  HENT:  Head: Normocephalic and atraumatic.  Eyes: EOM are normal. Pupils are equal, round, and reactive to light.  Neck: Normal range of motion. Neck supple. No thyromegaly present.  Cardiovascular: Normal rate, regular rhythm and normal heart sounds.   No murmur heard. Pulmonary/Chest: Breath sounds normal. No respiratory distress. She has no wheezes. She has no rales.  Abdominal: Soft. Bowel sounds are normal. She exhibits no distension and no mass. There is no tenderness. There is no rebound and no guarding.  Genitourinary:  Breasts symmetric with no mass  Musculoskeletal: Normal range of motion. She exhibits no edema.  Lymphadenopathy:    She has no cervical adenopathy.  Neurological: She is alert and oriented to person, place, and time. She displays normal reflexes. No cranial nerve deficit.  Skin: No rash noted.  Psychiatric: She has a normal mood and affect. Her  behavior is normal. Judgment and thought content normal.          Assessment & Plan:  Health maintenance. Hemoccult cards given. She's not willing to schedule colonoscopy yet. Continue at least every other year  mammogram. Previous hysterectomy so no Pap smear. Labs reviewed with patient with no major abnormalities. She has hyperlipidemia. 6% risk of CAD event over 10 years. She has decided against statin use of this time.

## 2013-05-25 NOTE — Patient Instructions (Signed)
Continue with every other year mammogram Complete hemoccult cards. Continue with regular calcium and Vit D supplementation.

## 2013-06-06 ENCOUNTER — Encounter: Payer: Self-pay | Admitting: Physical Medicine & Rehabilitation

## 2013-06-06 ENCOUNTER — Encounter: Payer: Medicare Other | Attending: Physical Medicine & Rehabilitation

## 2013-06-06 ENCOUNTER — Ambulatory Visit (HOSPITAL_BASED_OUTPATIENT_CLINIC_OR_DEPARTMENT_OTHER): Payer: Medicare Other | Admitting: Physical Medicine & Rehabilitation

## 2013-06-06 VITALS — BP 142/75 | HR 87 | Resp 14 | Ht 66.0 in | Wt 179.0 lb

## 2013-06-06 DIAGNOSIS — M25569 Pain in unspecified knee: Secondary | ICD-10-CM | POA: Insufficient documentation

## 2013-06-06 DIAGNOSIS — M5417 Radiculopathy, lumbosacral region: Secondary | ICD-10-CM

## 2013-06-06 DIAGNOSIS — M79609 Pain in unspecified limb: Secondary | ICD-10-CM | POA: Insufficient documentation

## 2013-06-06 DIAGNOSIS — IMO0002 Reserved for concepts with insufficient information to code with codable children: Secondary | ICD-10-CM

## 2013-06-06 DIAGNOSIS — R209 Unspecified disturbances of skin sensation: Secondary | ICD-10-CM | POA: Insufficient documentation

## 2013-06-06 NOTE — Patient Instructions (Signed)
PT ordered, they will call you

## 2013-06-06 NOTE — Progress Notes (Signed)
Subjective:    Patient ID: Rebecca Reid, female    DOB: [birth year], 67 y.o.   MRN: [id number]  HPI Onset of left lower extremity pain posterior aspect to the knee and radiating to the lateral aspect of the left foot. Accompanied by numbness of the left foot. Evaluated by primary care physician. Started on hydrocodone taking up to 3 times per day depending on pain level. Did not take prednisone secondary to history of agitation and insomnia, tried in past for sinus infection.  Using heat, medication, TENS unit. Also having chiropractic treatment which includes left hamstring stretches, minor adjustments, doing limited walking because of fear of rolling her left foot. 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 Was able to finish her shift   EMG showed signs of S1 radiculopathy with both chronic and acute features. This was performed 05/06/2013. Numbness in foot is getting a little better. Pain Inventory Average Pain 0 Pain Right Now 0 My pain is intermittent and aching  In the last 24 hours, has pain interfered with the following? General activity 4 Relation with others 0 Enjoyment of life 4 What TIME of day is your pain at its worst? morning and night Sleep (in general) Fair  Pain is worse with: sitting and standing Pain improves with: rest, pacing activities and TENS Relief from Meds: 6  Mobility walk without assistance ability to climb steps?  yes do you drive?  yes  Function not employed: date last employed .  Neuro/Psych weakness  Prior Studies Any changes since last visit?  no  Physicians involved in your care Any changes since last visit?  no   Family History  Problem Relation Age of Onset  . Emphysema Mother     heavy smoker  . Heart disease Mother 22  . Arthritis Other   . Hyperlipidemia Other   . Hypertension Other   . Cancer Other     prostate  . Heart disease Father 83   History   Social History  . Marital Status:  Divorced    Spouse Name: N/A    Number of Children: N/A  . Years of Education: N/A   Social History Main Topics  . Smoking status: Never Smoker   . Smokeless tobacco: None  . Alcohol Use: None  . Drug Use: None  . Sexual Activity: None   Other Topics Concern  . None   Social History Narrative  . None   Past Surgical History  Procedure Laterality Date  . Breast enhancement surgery  1987  . Cholecystectomy  1974  . Abdominal hysterectomy  1986  . Breast surgery  1974    biopsy   Past Medical History  Diagnosis Date  . HYPERLIPIDEMIA 04/30/2009  . HYPERTENSION 04/30/2009  . ALLERGIC RHINITIS CAUSE UNSPECIFIED 04/30/2009  . GERD 04/30/2009  . OSTEOPENIA 04/30/2009  . LEG EDEMA 12/12/2009  . Dysuria 05/06/2010  . DEPRESSION, HX OF 04/11/2010   BP 142/75  Pulse 87  Resp 14  Ht 5\' 6"  (1.676 m)  Wt 179 lb (81.194 kg)  BMI 28.91 kg/m2  SpO2 97%     Review of Systems  Musculoskeletal: Positive for back pain.  Neurological: Positive for weakness.  All other systems reviewed and are negative.       Objective:   Physical Exam Diminished left S1 to pinprick, normal left L5 Normal right S1 Left Achilles reflexes 2+ Right Achilles reflexes 2+  Strength is normal bilateral hip flexors knee extensors ankle  dorsiflexors Able to go up on the toes bilaterally but gets up higher on the right side than on the left side Back has no tenderness to palpation There is tenderness over the left greater trochanter of the hip        Assessment & Plan:  #1. Left S1 radiculopathy- slowly improving but still with pain limiting activity.  Refer to PT to eval posture, perform NM re education and strengthen core.  RTC in 3wks If no better consider MRI an possible ESI

## 2013-06-07 ENCOUNTER — Encounter: Payer: Self-pay | Admitting: Family Medicine

## 2013-07-01 ENCOUNTER — Ambulatory Visit (HOSPITAL_BASED_OUTPATIENT_CLINIC_OR_DEPARTMENT_OTHER): Payer: Medicare Other | Admitting: Physical Medicine & Rehabilitation

## 2013-07-01 ENCOUNTER — Encounter: Payer: Self-pay | Admitting: Physical Medicine & Rehabilitation

## 2013-07-01 ENCOUNTER — Encounter: Payer: Medicare Other | Attending: Physical Medicine & Rehabilitation

## 2013-07-01 VITALS — BP 144/73 | HR 86 | Resp 14 | Ht 66.0 in | Wt 181.0 lb

## 2013-07-01 DIAGNOSIS — M79609 Pain in unspecified limb: Secondary | ICD-10-CM | POA: Insufficient documentation

## 2013-07-01 DIAGNOSIS — M5417 Radiculopathy, lumbosacral region: Secondary | ICD-10-CM

## 2013-07-01 DIAGNOSIS — M25569 Pain in unspecified knee: Secondary | ICD-10-CM | POA: Insufficient documentation

## 2013-07-01 DIAGNOSIS — IMO0002 Reserved for concepts with insufficient information to code with codable children: Secondary | ICD-10-CM

## 2013-07-01 DIAGNOSIS — M720 Palmar fascial fibromatosis [Dupuytren]: Secondary | ICD-10-CM

## 2013-07-01 DIAGNOSIS — R209 Unspecified disturbances of skin sensation: Secondary | ICD-10-CM | POA: Insufficient documentation

## 2013-07-01 NOTE — Progress Notes (Signed)
Subjective:    Patient ID: Rebecca Reid, female    DOB: 03-07-1946, 67 y.o.   MRN: [id number]  HPI Onset of left lower extremity pain posterior aspect to the knee and radiating to the lateral aspect of the left foot. Accompanied by numbness of the left foot. Evaluated by primary care physician.  Started on hydrocodone taking up to 3 times per day depending on pain level. Did not take prednisone secondary to history of agitation and insomnia, tried in past for sinus infection.  Using heat, medication, TENS unit. Also having chiropractic treatment which includes left hamstring stretches, minor adjustments, doing limited walking because of fear of rolling her left foot.  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 This was performed 05/06/2013.  Numbness in foot is gone  Using Tylenol ES 2 tablets 3 times a day  Also would like me to check a cyst on her left palm, no pain no trauma, no trigger finger Pain Inventory Average Pain 2 Pain Right Now 2 My pain is aching  In the last 24 hours, has pain interfered with the following? General activity 3 Relation with others 0 Enjoyment of life 4 What TIME of day is your pain at its worst? morning Sleep (in general) Good  Pain is worse with: sitting and standing Pain improves with: pacing activities, medication and TENS Relief from Meds: 8  Mobility walk without assistance ability to climb steps?  yes do you drive?  yes  Function retired I need assistance with the following:  household duties  Neuro/Psych No problems in this area  Prior Studies Any changes since last visit?  no  Physicians involved in your care Any changes since last visit?  no   Family History  Problem Relation Age of Onset  . Emphysema Mother     heavy smoker  . Heart disease Mother 37  . Arthritis Other   . Hyperlipidemia Other   . Hypertension Other   .  Cancer Other     prostate  . Heart disease Father 23   History   Social History  . Marital Status: Divorced    Spouse Name: N/A    Number of Children: N/A  . Years of Education: N/A   Social History Main Topics  . Smoking status: Never Smoker   . Smokeless tobacco: None  . Alcohol Use: None  . Drug Use: None  . Sexual Activity: None   Other Topics Concern  . None   Social History Narrative  . None   Past Surgical History  Procedure Laterality Date  . Breast enhancement surgery  1987  . Cholecystectomy  1974  . Abdominal hysterectomy  1986  . Breast surgery  1974    biopsy   Past Medical History  Diagnosis Date  . HYPERLIPIDEMIA 04/30/2009  . HYPERTENSION 04/30/2009  . ALLERGIC RHINITIS CAUSE UNSPECIFIED 04/30/2009  . GERD 04/30/2009  . OSTEOPENIA 04/30/2009  . LEG EDEMA 12/12/2009  . Dysuria 05/06/2010  . DEPRESSION, HX OF 04/11/2010   BP 144/73  Pulse 86  Resp 14  Ht 5\' 6"  (1.676 m)  Wt 181 lb (82.101 kg)  BMI 29.23 kg/m2  SpO2 95%     Review of Systems  Musculoskeletal: Positive for back pain.  All other systems reviewed and are negative.       Objective:   Physical Exam  Nursing note and vitals reviewed. Constitutional: She  is oriented to person, place, and time. She appears well-developed and well-nourished.  HENT:  Head: Normocephalic and atraumatic.  Eyes: Conjunctivae and EOM are normal. Pupils are equal, round, and reactive to light.  Neck: Normal range of motion.  Musculoskeletal:       Lumbar back: She exhibits decreased range of motion and tenderness. She exhibits no deformity and no spasm.  Decreased lumbar extension with pain at and range Mild paraspinal tenderness lower lumbar area  Neurological: She is alert and oriented to person, place, and time. She has normal strength. No sensory deficit. Gait normal.  Decreased sensation over her left S1 area pinprick as well as light touch  Psychiatric: She has a normal mood and affect.    Left palm with Dupuytren's   contracture proximal to the fourth metacarpal head       Assessment & Plan:  1.  Left S1 radiculopathy-slowly improving, still has back pain, recommend core strengthening and walking.  Progress to exercise machines at gym, discussed progression with pt in more detail   2. 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

## 2013-07-01 NOTE — Patient Instructions (Signed)
Dupuytren's Contracture  Dupuytren's contracture affects the fingers and the palm of the hand. This condition usually develops slowly. It may take many years to develop. The pinky finger and the ring finger are most often affected. These fingers start to curve inward, like a claw. At some point, the fingers cannot go straight anymore. This can make it hard to do things like:  · Put on gloves.  · Shake hands.  · Grab something off a shelf.  The condition usually does not cause pain and is not dangerous.  The condition gets its name from the doctor who came up with an operation to fix the problem. His name was Baron Guillaume Dupuytren. Contracture means pulling inward.  CAUSES   Dupuytren's contracture does not start with the fingers. It starts in the palm of the hand, under the skin. The tissue under the skin is called fascia. The fascia covers the cords (tendons) that control how the fingers move. In Dupuytren's contracture the fascia tissue becomes thick and then pulls on the cords. That causes the fingers to curl. The condition can affect both hands and any fingers, but it usually strikes one hand worse than the other. The fingers farthest from the thumb are most often the ones that curl. The cause is not clear. Some experts believe it results from an autoimmune reaction. That means the body's immune system (which fights off disease) attacks itself by mistake. What experts do know is that certain conditions and behaviors (called risk factors) make the chance of having this condition more likely. They include:  · Age. Most people who have the condition are older than 50.  · Sex. It affects men more often than women.  · Family history. The condition tends to run in families from countries in [place].  · Certain behaviors. People who smoke and drink alcohol are more apt to develop the problem.  · Some other medical conditions. Having diabetes makes Dupuytren's contracture more likely. So does  having a condition that involves a seizure (when the brain's function is interrupted).  SYMPTOMS   Signs of this condition take time to develop. Sometimes this takes weeks or months. More often, it takes several years.   · Early symptoms:  · Skin on the palm of the hand becomes thick. This is usually the first sign.  · The skin may look dimpled or puckered.  · Lumps (nodules) show up on the palm. There may be one or more lumps. They are not painful.  · Later symptoms:  · Thick cords of tissue form in the palm of the hand.  · The pinky and ring fingers start to curl up into the palm.  · The fingers cannot be straightened into their normal position.  DIAGNOSIS   A physical examination is the main way that a healthcare provider can tell if you have Dupuytren's contracture. Other tests usually are not needed. The caregiver will probably:  · Look at your hands. Feel your hands. This is to check for thickening and nodules.  · Measure finger motion. This tells how much your fingers have contracted (pulled in).  · Do a tabletop test. You will be asked to try to put your hand flat on a table, palm down.  TREATMENT   There is no cure for Dupuytren's contracture. But there are ways to treat the symptoms. Options include:  · Watching and waiting. The condition develops slowly. Often it does not create problems for a long time. Sometimes the   skin gets thick and nodules form, but the fingers never curl. So, in some cases it is best to just watch the condition carefully and wait to see what happens.  · Shots (injections). Different substances may be injected, including:  · Steroids. These drugs block swelling. These shots should make the condition less uncomfortable. Steroids may also slow down the condition. Shots are given into the nodules. The effect only lasts awhile. More shots may have to be given.  · Enzymes. These are proteins. They weaken the thick tissue. After an injection, the caregiver usually stretches the  fingers.  · Needling. A needle is pushed through the skin and into the thick tissue. This is done in several spots. The goal is to break up the thickened tissue. Or to weaken it.  · Surgery. This may be suggested if you cannot grasp objects. Or, if you can no longer put your hand in your pocket.  · A cut (incision) is made in the palm of the hand. The thick tissue is removed.  · Sometimes the thick tissue is attached to the skin. Then, the skin must be removed, too. It is replaced with a piece of skin from another place on your body. That is called a skin graft.  · Occupational or hand therapy is almost always needed after surgery. This involves special exercises to get back the use of your hand and fingers. After a skin graft, several months of therapy may be needed.  · Sometimes the condition comes back, even after surgery.  · Other methods. You can do some things on your own. They include:  · Stretching the fingers backwards. Do this often.  · Warming the hand and massaging it. Again, do this often.  · Using tools with padded grips. This should make things easier.  · Wearing heavy gloves while working. This protects the hands.  PROGNOSIS   Dupuytren's contracture usually develops slowly. There is no cure. But, the symptoms can be treated. Sometimes they come back after treatment, but not always. It is important to remember that this is a functional problem and not a life-threatening condition.  Document Released: 05/04/2009 Document Revised: 09/29/2011 Document Reviewed: 05/04/2009  Exit[employer]® Patient Information ©2014 [employer].

## 2013-08-05 ENCOUNTER — Ambulatory Visit: Payer: Medicare Other | Admitting: Physical Medicine & Rehabilitation

## 2013-08-15 ENCOUNTER — Encounter: Payer: Self-pay | Admitting: Family Medicine

## 2013-08-15 ENCOUNTER — Ambulatory Visit (INDEPENDENT_AMBULATORY_CARE_PROVIDER_SITE_OTHER): Payer: Medicare HMO | Admitting: Family Medicine

## 2013-08-15 VITALS — BP 132/82 | HR 85 | Temp 97.8°F | Wt 179.0 lb

## 2013-08-15 DIAGNOSIS — Z8719 Personal history of other diseases of the digestive system: Secondary | ICD-10-CM

## 2013-08-15 DIAGNOSIS — R1032 Left lower quadrant pain: Secondary | ICD-10-CM

## 2013-08-15 MED ORDER — ONDANSETRON 8 MG PO TBDP: 8.0000 mg | ORAL_TABLET | Freq: Three times a day (TID) | ORAL | Status: DC | PRN

## 2013-08-15 MED ORDER — CIPROFLOXACIN HCL 500 MG PO TABS: 500.0000 mg | ORAL_TABLET | Freq: Two times a day (BID) | ORAL | Status: DC

## 2013-08-15 MED ORDER — METRONIDAZOLE 500 MG PO TABS: 500.0000 mg | ORAL_TABLET | Freq: Three times a day (TID) | ORAL | Status: DC

## 2013-08-15 NOTE — Progress Notes (Signed)
Pre visit review using our clinic review tool, if applicable. No additional management support is needed unless otherwise documented below in the visit note. 

## 2013-08-15 NOTE — Progress Notes (Signed)
Subjective:    Patient ID: Rebecca Reid, female    DOB: 08-10-1945, 68 y.o.   MRN: [id number]  HPI Acute visit for a four-day history of intermittent nausea, intermittent diarrhea, left lower quadrant pain. Patient states she has history of acute diverticulitis and had very similar symptoms the past. She's not had a colonoscopy in several years though we have encouraged her to get repeat.  She has been keeping down some Gatorade and bland foods for the past day. She has not had a documented fever but has had some chills off and on. She had a normal formed stool earlier today. Diarrhea past few days. No bloody stools. No urinary symptoms.  Patient does not have any known antibiotic allergies. Regarding her pain, this is somewhat dull and in no exacerbating factors. No alleviating factors. No significant radiation.  She has been somewhat lightheaded past couple days with position change but no syncope. She is keeping down some Gatorade and other fluids.  Past Medical History  Diagnosis Date  . HYPERLIPIDEMIA 04/30/2009  . HYPERTENSION 04/30/2009  . ALLERGIC RHINITIS CAUSE UNSPECIFIED 04/30/2009  . GERD 04/30/2009  . OSTEOPENIA 04/30/2009  . LEG EDEMA 12/12/2009  . Dysuria 05/06/2010  . DEPRESSION, HX OF 04/11/2010   Past Surgical History  Procedure Laterality Date  . Breast enhancement surgery  1987  . Cholecystectomy  1974  . Abdominal hysterectomy  1986  . Breast surgery  1974    biopsy    reports that she has never smoked. She does not have any smokeless tobacco history on file. Her alcohol and drug histories are not on file. family history includes Arthritis in her other; Cancer in her other; Emphysema in her mother; Heart disease (age of onset: 48) in her father; Heart disease (age of onset: 46) in her mother; Hyperlipidemia in her other; Hypertension in her other. Allergies  Allergen Reactions  . Erythromycin Base     REACTION: yeast infection, GI upset  . Naproxen    REACTION: GI upset  . Prednisone     REACTION: hives, anxiety, insomnia      Review of Systems  Constitutional: Positive for chills, appetite change and fatigue. Negative for fever and unexpected weight change.  Respiratory: Negative for cough and shortness of breath.   Cardiovascular: Negative for chest pain.  Gastrointestinal: Positive for nausea, abdominal pain and diarrhea. Negative for constipation, blood in stool and abdominal distention.  Genitourinary: Negative for dysuria.  Neurological: Positive for light-headedness. Negative for syncope.       Objective:   Physical Exam  Constitutional: She appears well-developed and well-nourished.  HENT:  Mouth/Throat: Oropharynx is clear and moist.  Neck: Neck supple.  Cardiovascular: Normal rate and regular rhythm.   Pulmonary/Chest: Effort normal and breath sounds normal. No respiratory distress. She has no wheezes. She has no rales.  Abdominal: Soft. Bowel sounds are normal. She exhibits no distension and no mass. There is tenderness. There is no rebound and no guarding.  Patient has some very minimal tenderness left lower quadrant to deep palpation. No guarding or rebound. No mass.  Neurological: She is alert.          Assessment & Plan:   acute left lower quadrant pain associated with anorexia and nausea and intermittent diarrhea. She states these symptoms are exactly like previous episodes of acute diverticulitis. She has nonacute abdomen. Zofran 8 mg every 8 hours as needed for nausea and vomiting. Increase fluid intake. Cipro 500 mg twice a day for 10  days. Flagyl 3 times a day for 10 days. Followup promptly for recurrent vomiting, increasing abdominal pain, distention.

## 2013-08-15 NOTE — Patient Instructions (Signed)
Diverticulitis °A diverticulum is a small pouch or sac on the colon. Diverticulosis is the presence of these diverticula on the colon. Diverticulitis is the irritation (inflammation) or infection of diverticula. °CAUSES  °The colon and its diverticula contain bacteria. If food particles block the tiny opening to a diverticulum, the bacteria inside can grow and cause an increase in pressure. This leads to infection and inflammation and is called diverticulitis. °SYMPTOMS  °· Abdominal pain and tenderness. Usually, the pain is located on the left side of your abdomen. However, it could be located elsewhere. °· Fever. °· Bloating. °· Feeling sick to your stomach (nausea). °· Throwing up (vomiting). °· Abnormal stools. °DIAGNOSIS  °Your caregiver will take a history and perform a physical exam. Since many things can cause abdominal pain, other tests may be necessary. Tests may include: °· Blood tests. °· Urine tests. °· X-ray of the abdomen. °· CT scan of the abdomen. °Sometimes, surgery is needed to determine if diverticulitis or other conditions are causing your symptoms. °TREATMENT  °Most of the time, you can be treated without surgery. Treatment includes: °· Resting the bowels by only having liquids for a few days. As you improve, you will need to eat a low-fiber diet. °· Intravenous (IV) fluids if you are losing body fluids (dehydrated). °· Antibiotic medicines that treat infections may be given. °· Pain and nausea medicine, if needed. °· Surgery if the inflamed diverticulum has burst. °HOME CARE INSTRUCTIONS  °· Try a clear liquid diet (broth, tea, or water for as long as directed by your caregiver). You may then gradually begin a low-fiber diet as tolerated.  °A low-fiber diet is a diet with less than 10 grams of fiber. Choose the foods below to reduce fiber in the diet: °· White breads, cereals, rice, and pasta. °· Cooked fruits and vegetables or soft fresh fruits and vegetables without the skin. °· Ground or  well-cooked tender beef, ham, veal, lamb, pork, or poultry. °· Eggs and seafood. °· After your diverticulitis symptoms have improved, your caregiver may put you on a high-fiber diet. A high-fiber diet includes 14 grams of fiber for every 1000 calories consumed. For a standard 2000 calorie diet, you would need 28 grams of fiber. Follow these diet guidelines to help you increase the fiber in your diet. It is important to slowly increase the amount fiber in your diet to avoid gas, constipation, and bloating. °· Choose whole-grain breads, cereals, pasta, and brown rice. °· Choose fresh fruits and vegetables with the skin on. Do not overcook vegetables because the more vegetables are cooked, the more fiber is lost. °· Choose more nuts, seeds, legumes, dried peas, beans, and lentils. °· Look for food products that have greater than 3 grams of fiber per serving on the Nutrition Facts label. °· Take all medicine as directed by your caregiver. °· If your caregiver has given you a follow-up appointment, it is very important that you go. Not going could result in lasting (chronic) or permanent injury, pain, and disability. If there is any problem keeping the appointment, call to reschedule. °SEEK MEDICAL CARE IF:  °· Your pain does not improve. °· You have a hard time advancing your diet beyond clear liquids. °· Your bowel movements do not return to normal. °SEEK IMMEDIATE MEDICAL CARE IF:  °· Your pain becomes worse. °· You have an oral temperature above 102° F (38.9° C), not controlled by medicine. °· You have repeated vomiting. °· You have bloody or black, tarry stools. °·   Symptoms that brought you to your caregiver become worse or are not getting better. °MAKE SURE YOU:  °· Understand these instructions. °· Will watch your condition. °· Will get help right away if you are not doing well or get worse. °Document Released: 04/16/2005 Document Revised: 09/29/2011 Document Reviewed: 08/12/2010 °ExitCare® Patient Information  ©2014 [employer]. ° °

## 2013-08-16 ENCOUNTER — Encounter: Payer: Self-pay | Admitting: Family Medicine

## 2013-08-21 ENCOUNTER — Encounter: Payer: Self-pay | Admitting: Family Medicine

## 2013-08-21 DIAGNOSIS — R1032 Left lower quadrant pain: Secondary | ICD-10-CM

## 2013-08-22 NOTE — Telephone Encounter (Signed)
We'll set up CT abdomen and pelvis to rule out persistent left lower quadrant abdominal pain. Let patient know this has been ordered

## 2013-08-24 ENCOUNTER — Other Ambulatory Visit (INDEPENDENT_AMBULATORY_CARE_PROVIDER_SITE_OTHER): Payer: Medicare HMO

## 2013-08-24 ENCOUNTER — Telehealth: Payer: Self-pay | Admitting: Family Medicine

## 2013-08-24 DIAGNOSIS — R1032 Left lower quadrant pain: Secondary | ICD-10-CM

## 2013-08-24 LAB — BASIC METABOLIC PANEL
BUN: 19 mg/dL (ref 6–23)
CALCIUM: 9.4 mg/dL (ref 8.4–10.5)
CO2: 29 mEq/L (ref 19–32)
Chloride: 101 mEq/L (ref 96–112)
Creatinine, Ser: 0.9 mg/dL (ref 0.4–1.2)
GFR: 68.22 mL/min (ref >60.00)
Glucose, Bld: 78 mg/dL (ref 70–99)
POTASSIUM: 3.5 meq/L (ref 3.5–5.1)
Sodium: 137 mEq/L (ref 135–145)

## 2013-08-24 NOTE — Telephone Encounter (Signed)
Order is placed.

## 2013-08-24 NOTE — Telephone Encounter (Signed)
Please place order for pt to have bun & creatinine completed this afternoon. Her CT is tomorrow and she will need these labs done.

## 2013-08-25 ENCOUNTER — Ambulatory Visit (INDEPENDENT_AMBULATORY_CARE_PROVIDER_SITE_OTHER)
Admission: RE | Admit: 2013-08-25 | Discharge: 2013-08-25 | Disposition: A | Discharge status: Home / Self Care | Payer: Medicare HMO | Source: Ambulatory Visit | Attending: Family Medicine | Admitting: Family Medicine

## 2013-08-25 DIAGNOSIS — R1032 Left lower quadrant pain: Secondary | ICD-10-CM

## 2013-08-25 MED ORDER — IOHEXOL 300 MG/ML  SOLN
100.0000 mL | Freq: Once | INTRAMUSCULAR | Status: AC | PRN
  Administered 2013-08-25: 100 mL via INTRAVENOUS

## 2013-08-29 ENCOUNTER — Other Ambulatory Visit: Payer: Self-pay | Admitting: Family Medicine

## 2013-08-29 DIAGNOSIS — R1032 Left lower quadrant pain: Secondary | ICD-10-CM

## 2013-09-10 ENCOUNTER — Encounter: Payer: Self-pay | Admitting: Family Medicine

## 2013-09-11 ENCOUNTER — Other Ambulatory Visit: Payer: Self-pay | Admitting: Family Medicine

## 2013-09-12 ENCOUNTER — Other Ambulatory Visit: Payer: Self-pay | Admitting: Family Medicine

## 2013-09-12 DIAGNOSIS — K862 Cyst of pancreas: Secondary | ICD-10-CM

## 2013-09-12 NOTE — Telephone Encounter (Signed)
Rebecca Reid will you go ahead and set up.  Follow up Abdominal and Pelvic CT in June to reassess pancreatic cyst.

## 2013-09-27 ENCOUNTER — Encounter: Payer: Self-pay | Admitting: Family Medicine

## 2013-09-28 ENCOUNTER — Other Ambulatory Visit: Payer: Self-pay | Admitting: Family Medicine

## 2013-09-28 DIAGNOSIS — M545 Low back pain, unspecified: Secondary | ICD-10-CM

## 2013-10-04 ENCOUNTER — Ambulatory Visit: Payer: Medicare HMO | Attending: Family Medicine

## 2013-10-04 DIAGNOSIS — M545 Low back pain, unspecified: Secondary | ICD-10-CM | POA: Insufficient documentation

## 2013-10-04 DIAGNOSIS — R5381 Other malaise: Secondary | ICD-10-CM | POA: Insufficient documentation

## 2013-10-04 DIAGNOSIS — M25569 Pain in unspecified knee: Secondary | ICD-10-CM | POA: Insufficient documentation

## 2013-10-04 DIAGNOSIS — IMO0001 Reserved for inherently not codable concepts without codable children: Secondary | ICD-10-CM | POA: Insufficient documentation

## 2013-10-06 ENCOUNTER — Ambulatory Visit: Payer: Medicare HMO

## 2013-10-11 ENCOUNTER — Ambulatory Visit: Payer: Medicare HMO | Admitting: Physical Therapy

## 2013-10-13 ENCOUNTER — Ambulatory Visit: Payer: Medicare HMO | Admitting: Physical Therapy

## 2013-10-18 ENCOUNTER — Ambulatory Visit: Payer: Medicare HMO | Admitting: Physical Therapy

## 2013-10-20 ENCOUNTER — Ambulatory Visit: Payer: Medicare HMO | Admitting: Physical Therapy

## 2013-10-25 ENCOUNTER — Ambulatory Visit: Payer: Medicare HMO | Attending: Family Medicine

## 2013-10-25 DIAGNOSIS — M545 Low back pain, unspecified: Secondary | ICD-10-CM | POA: Insufficient documentation

## 2013-10-25 DIAGNOSIS — IMO0001 Reserved for inherently not codable concepts without codable children: Secondary | ICD-10-CM | POA: Insufficient documentation

## 2013-10-25 DIAGNOSIS — M25569 Pain in unspecified knee: Secondary | ICD-10-CM | POA: Insufficient documentation

## 2013-10-25 DIAGNOSIS — R5381 Other malaise: Secondary | ICD-10-CM | POA: Insufficient documentation

## 2013-10-27 ENCOUNTER — Ambulatory Visit: Payer: Medicare HMO | Admitting: Physical Therapy

## 2013-11-01 ENCOUNTER — Ambulatory Visit: Payer: Medicare HMO | Admitting: Physical Therapy

## 2013-11-03 ENCOUNTER — Ambulatory Visit: Payer: Medicare HMO

## 2013-11-08 ENCOUNTER — Ambulatory Visit: Payer: Medicare HMO | Admitting: Physical Therapy

## 2013-11-10 ENCOUNTER — Ambulatory Visit: Payer: Medicare HMO | Admitting: Physical Therapy

## 2013-11-15 ENCOUNTER — Ambulatory Visit: Payer: Medicare HMO | Admitting: Physical Therapy

## 2013-11-17 ENCOUNTER — Ambulatory Visit: Payer: Medicare HMO | Admitting: Physical Therapy

## 2013-11-22 ENCOUNTER — Encounter: Payer: Medicare HMO | Admitting: Physical Therapy

## 2013-11-23 ENCOUNTER — Ambulatory Visit: Payer: Medicare HMO | Admitting: Family Medicine

## 2013-11-24 ENCOUNTER — Ambulatory Visit: Payer: Medicare HMO | Attending: Family Medicine

## 2013-11-24 DIAGNOSIS — IMO0001 Reserved for inherently not codable concepts without codable children: Secondary | ICD-10-CM | POA: Insufficient documentation

## 2013-11-24 DIAGNOSIS — R5381 Other malaise: Secondary | ICD-10-CM | POA: Insufficient documentation

## 2013-11-24 DIAGNOSIS — M25569 Pain in unspecified knee: Secondary | ICD-10-CM | POA: Insufficient documentation

## 2013-11-24 DIAGNOSIS — M545 Low back pain, unspecified: Secondary | ICD-10-CM | POA: Insufficient documentation

## 2013-12-09 ENCOUNTER — Ambulatory Visit (INDEPENDENT_AMBULATORY_CARE_PROVIDER_SITE_OTHER): Payer: Medicare HMO | Admitting: Family Medicine

## 2013-12-09 ENCOUNTER — Encounter: Payer: Self-pay | Admitting: Family Medicine

## 2013-12-09 ENCOUNTER — Ambulatory Visit (INDEPENDENT_AMBULATORY_CARE_PROVIDER_SITE_OTHER)
Admission: RE | Admit: 2013-12-09 | Discharge: 2013-12-09 | Disposition: A | Discharge status: Home / Self Care | Payer: Medicare HMO | Source: Ambulatory Visit | Attending: Family Medicine | Admitting: Family Medicine

## 2013-12-09 VITALS — BP 130/80 | HR 70 | Wt 184.0 lb

## 2013-12-09 DIAGNOSIS — M5416 Radiculopathy, lumbar region: Secondary | ICD-10-CM

## 2013-12-09 DIAGNOSIS — M25559 Pain in unspecified hip: Secondary | ICD-10-CM

## 2013-12-09 DIAGNOSIS — M25552 Pain in left hip: Secondary | ICD-10-CM

## 2013-12-09 DIAGNOSIS — IMO0002 Reserved for concepts with insufficient information to code with codable children: Secondary | ICD-10-CM

## 2013-12-09 NOTE — Progress Notes (Signed)
Pre visit review using our clinic review tool, if applicable. No additional management support is needed unless otherwise documented below in the visit note. 

## 2013-12-09 NOTE — Progress Notes (Signed)
   Subjective:    Patient ID: Rebecca Reid, female    DOB: 11-Dec-1945, 68 y.o.   MRN: [id number]  HPI Patient here to discuss back pain. She's had several months of lumbar back pain. Occasional left lower extremity radiculopathy symptoms. She has completed 2 months of physical therapy and saw some improvement-but no resolution of her lumbar pain. She's been doing daily exercises stretching and core strengthening. She is using a TENS unit off and on. She has some concern about possible left hip pain. Her pain is worse with movement but frequently at rest and interfering with sleep. She and the past has had some transient weakness left lower extremity but none currently. No loss of urine or stool control. No fever. No chills. No dysuria. No appetite or weight changes. She has frequent lumbar back pain and moderate pain. No history of any back surgery. Has never had an MRI of her lumbar spine.  No constitutional symptoms such as fever, chills, appetite or weight changes.  Past Medical History  Diagnosis Date  . HYPERLIPIDEMIA 04/30/2009  . HYPERTENSION 04/30/2009  . ALLERGIC RHINITIS CAUSE UNSPECIFIED 04/30/2009  . GERD 04/30/2009  . OSTEOPENIA 04/30/2009  . LEG EDEMA 12/12/2009  . Dysuria 05/06/2010  . DEPRESSION, HX OF 04/11/2010   Past Surgical History  Procedure Laterality Date  . Breast enhancement surgery  1987  . Cholecystectomy  1974  . Abdominal hysterectomy  1986  . Breast surgery  1974    biopsy    reports that she has never smoked. She does not have any smokeless tobacco history on file. Her alcohol and drug histories are not on file. family history includes Arthritis in her other; Cancer in her other; Emphysema in her mother; Heart disease (age of onset: 33) in her father; Heart disease (age of onset: 10) in her mother; Hyperlipidemia in her other; Hypertension in her other. Allergies  Allergen Reactions  . Erythromycin Base     REACTION: yeast infection, GI upset  .  Naproxen     REACTION: GI upset  . Prednisone     REACTION: hives, anxiety, insomnia      Review of Systems  Constitutional: Negative for fever, chills, appetite change and unexpected weight change.  Respiratory: Negative for shortness of breath.   Cardiovascular: Negative for chest pain.  Gastrointestinal: Negative for abdominal pain.  Genitourinary: Negative for dysuria.  Neurological: Negative for dizziness, weakness and numbness.       Objective:   Physical Exam  Constitutional: She appears well-developed and well-nourished.  Cardiovascular: Normal rate and regular rhythm.   Pulmonary/Chest: Effort normal and breath sounds normal. No respiratory distress. She has no wheezes. She has no rales.  Musculoskeletal: She exhibits no edema.  Straight leg raises are negative bilaterally She has some pain with internal and external rotation left hip but fairly good range of motion. No lateral hip tenderness  Neurological:   full-strength lower extremities. 2+ reflexes knee and ankle bilaterally Normal sensory lower extremities.          Assessment & Plan:  Lumbar back pain with left hip pain. She has occasional lumbar radiculopathy symptoms. Nonfocal exam neurologically at this time. X-rays left hip. If no major arthritic concerns consider MRI lumbar spine. She has failed conservative therapy with medications and physical therapy along with TENS unit and has now had left lumbar radiculopathy pains for several months.

## 2013-12-14 ENCOUNTER — Ambulatory Visit (INDEPENDENT_AMBULATORY_CARE_PROVIDER_SITE_OTHER): Payer: Medicare HMO | Admitting: Internal Medicine

## 2013-12-14 ENCOUNTER — Telehealth: Payer: Self-pay | Admitting: Family Medicine

## 2013-12-14 ENCOUNTER — Encounter: Payer: Self-pay | Admitting: Internal Medicine

## 2013-12-14 VITALS — BP 120/90 | Temp 98.5°F | Wt 179.0 lb

## 2013-12-14 DIAGNOSIS — K863 Pseudocyst of pancreas: Secondary | ICD-10-CM

## 2013-12-14 DIAGNOSIS — R109 Unspecified abdominal pain: Secondary | ICD-10-CM | POA: Insufficient documentation

## 2013-12-14 DIAGNOSIS — K862 Cyst of pancreas: Secondary | ICD-10-CM

## 2013-12-14 LAB — POCT URINALYSIS DIPSTICK
BILIRUBIN UA: NEGATIVE
Glucose, UA: NEGATIVE
Ketones, UA: NEGATIVE
LEUKOCYTES UA: NEGATIVE
NITRITE UA: NEGATIVE
PH UA: 7
PROTEIN UA: NEGATIVE
RBC UA: NEGATIVE
Spec Grav, UA: 1.01
UROBILINOGEN UA: 0.2

## 2013-12-14 NOTE — Telephone Encounter (Signed)
Noted  

## 2013-12-14 NOTE — Progress Notes (Signed)
Chief Complaint  Patient presents with  . Flank Pain    Started yesterday early afternoon.  Nausea started today along with urinary frequency.  Pt said she is drinking more water.  HPI: Patient comes in today for SDA for  new problem evaluation. Here with husband . PCP not in office this pm ;not available Yesterday acute onset when just sitting around of right side pain flank pain is sharp and difficult radiating to the right anterior abdomen and is worse with taking a deep breath goes up to 7/10 versus 3/10. No specific trauma cough shortness of breath. Has some urinary frequency but no hematuria nausea no vomiting or diarrhea. Has not had this kind of pain before. No history of kidney stones has had a recent CT of the abdomen in February that showed a pancreatic lesion probable cyst but advise followup in 4 months.  No history of kidney stones. Has some hydrocodone at home but not taking it. Says she has had shingles a least 4-5 times. Doesn't think that's it.  Seen 5 days ago for hip pain see below. Plan to have a lumbar MRI Assessment & Plan:   Lumbar back pain with left hip pain. She has occasional lumbar radiculopathy symptoms. Nonfocal exam neurologically at this time. X-rays left hip. If no major arthritic concerns consider MRI lumbar spine. She has failed conservative therapy with medications and physical therapy along with TENS unit and has now had left lumbar radiculopathy pains for several months.  ROS: See pertinent positives and negatives per HPI.  Past Medical History  Diagnosis Date  . HYPERLIPIDEMIA 04/30/2009  . HYPERTENSION 04/30/2009  . ALLERGIC RHINITIS CAUSE UNSPECIFIED 04/30/2009  . GERD 04/30/2009  . OSTEOPENIA 04/30/2009  . LEG EDEMA 12/12/2009  . Dysuria 05/06/2010  . DEPRESSION, HX OF 04/11/2010    Family History  Problem Relation Age of Onset  . Emphysema Mother     heavy smoker  . Heart disease Mother 16  . Arthritis Other   . Hyperlipidemia Other     . Hypertension Other   . Cancer Other     prostate  . Heart disease Father 78    History   Social History  . Marital Status: Divorced    Spouse Name: N/A    Number of Children: N/A  . Years of Education: N/A   Social History Main Topics  . Smoking status: Never Smoker   . Smokeless tobacco: None  . Alcohol Use: None  . Drug Use: None  . Sexual Activity: None   Other Topics Concern  . None   Social History Narrative  . None    Outpatient Encounter Prescriptions as of 12/14/2013  Medication Sig  . acetaminophen (TYLENOL) 500 MG tablet Take 500 mg by mouth as needed.  Marland Kitchen aspirin 81 MG tablet Take 81 mg by mouth daily.    . cetirizine (ZYRTEC) 5 MG tablet Take 5 mg by mouth as needed for allergies.  Marland Kitchen losartan-hydrochlorothiazide (HYZAAR) 100-25 MG per tablet TAKE 1 TABLET BY MOUTH DAILY.  Marland Kitchen omeprazole (PRILOSEC) 20 MG capsule Take 20 mg by mouth 2 (two) times daily.   . sertraline (ZOLOFT) 50 MG tablet Take 1 tablet (50 mg total) by mouth daily.  Marland Kitchen triamcinolone (NASACORT) 55 MCG/ACT nasal inhaler Place 2 sprays into the nose as needed.   Marland Kitchen albuterol (PROVENTIL HFA;VENTOLIN HFA) 108 (90 BASE) MCG/ACT inhaler Inhale 2 puffs into the lungs every 6 (six) hours as needed for wheezing.  . ondansetron (ZOFRAN ODT)  8 MG disintegrating tablet Take 1 tablet (8 mg total) by mouth every 8 (eight) hours as needed for nausea or vomiting.    EXAM:  BP 120/90  Temp(Src) 98.5 F (36.9 C) (Oral)  Wt 179 lb (81.194 kg)  Body mass index is 28.91 kg/(m^2).  GENERAL: vitals reviewed and listed above, alert, oriented, appears well hydrated and in mild distress at times especially when moving from supine to sitting. HEENT: atraumatic, conjunctiva  clear, no obvious abnormalities on inspection of external nose and ears NECK: no obvious masses on inspection palpation  LUNGS: clear to auscultation bilaterally, no wheezes, rales or rhonchi, good air movement Somewhat tender right upper Q and  lower right rib cage and lateral rib no crepitus or bruising. CV: HRRR, no clubbing cyanosis or  peripheral edema nl cap refill  Abdomen soft without organomegaly guarding or rebound but some tenderness in the right lateral gutter and right up under the rib cage. Skin some faint erythema around the right flank the patient states it's because she's been rubbing it and rubbing it so much. No dysesthesia and no pustules or vesicles. MS: moves all extremities without noticeable focal  abnormality PSYCH: pleasant and cooperative, no obvious depression or anxiety  ASSESSMENT AND PLAN:  Discussed the following assessment and plan:  Flank pain r - atypical radiation could be GU tract vs dermatomal but worse withinspritaion no referred shoulder pain. - Plan: POC Urinalysis Dipstick, CBC with Differential, Basic metabolic panel, CBC with Differential, Basic metabolic panel, CT Abdomen Pelvis W Contrast, CANCELED: CT Abdomen Pelvis Wo Contrast, CANCELED: CT Abdomen Pelvis Wo Contrast  Right sided abdominal pain - upper lateral  - Plan: POC Urinalysis Dipstick, CBC with Differential, Basic metabolic panel, CBC with Differential, Basic metabolic panel, CT Abdomen Pelvis W Contrast  Pancreatic cyst - Plan: POC Urinalysis Dipstick, CBC with Differential, Basic metabolic panel, CBC with Differential, Basic metabolic panel, CT Abdomen Pelvis W Contrast, CANCELED: CT Abdomen Pelvis Wo Contrast, CANCELED: CT Abdomen Pelvis Wo Contrast Will use left over hydrocodone for pain if needed  -Patient advised to return or notify health care team  if symptoms worsen ,persist or new concerns arise.  Patient Instructions  Repeat the ct as you are due for this Consider  This to rule out kidney stone or other process in right upper abdomen and lower rib area .  Still could be shingles cause of severity of pain.     Standley Brooking. Dorice Stiggers M.D.  Pre visit review using our clinic review tool, if applicable. No additional  management support is needed unless otherwise documented below in the visit note.

## 2013-12-14 NOTE — Telephone Encounter (Signed)
Patient Information:  Caller Name: Kahlan  Phone: [phone]  Patient: Rebecca Reid  Gender: Female  DOB: 08-01-1945  Age: 68 Years  PCP: Carolann Littler (Family Practice)  Office Follow Up:  Does the office need to follow up with this patient?: No  Instructions For The Office: N/A   Symptoms  Reason For Call & Symptoms: Patient calling, she has right flank pain since last pm.  No known injury.  She has urinated this am without pain or blood in the urine.   Her last bm was this morning.  No blood seen in the stool.  If she takes a deep breath or coughs or sneezes the pain worsens.  Reviewed Health History In EMR: Yes  Reviewed Medications In EMR: Yes  Reviewed Allergies In EMR: Yes  Reviewed Surgeries / Procedures: Yes  Date of Onset of Symptoms: 12/13/2013  Guideline(s) Used:  Flank Pain  Disposition Per Guideline:   See Today in Office  Reason For Disposition Reached:   Moderate pain (e.g., interferes with normal activities or awakens from sleep)  Advice Given:  N/A  Patient Will Follow Care Advice:  YES  Appointment Scheduled:  12/14/2013 15:15:00 Appointment Scheduled Provider:  Shanon Ace (Family Practice)

## 2013-12-14 NOTE — Patient Instructions (Signed)
Repeat the ct as you are due for this Consider  This to rule out kidney stone or other process in right upper abdomen and lower rib area .  Still could be shingles cause of severity of pain.

## 2013-12-15 ENCOUNTER — Encounter: Payer: Self-pay | Admitting: Internal Medicine

## 2013-12-15 LAB — CBC WITH DIFFERENTIAL/PLATELET
BASOS ABS: 0 10*3/uL (ref 0.0–0.1)
Basophils Relative: 0.1 % (ref 0.0–3.0)
EOS ABS: 0.1 10*3/uL (ref 0.0–0.7)
Eosinophils Relative: 0.8 % (ref 0.0–5.0)
HCT: 37.9 % (ref 36.0–46.0)
HEMOGLOBIN: 12.8 g/dL (ref 12.0–15.0)
LYMPHS PCT: 17.2 % (ref 12.0–46.0)
Lymphs Abs: 1.5 10*3/uL (ref 0.7–4.0)
MCHC: 33.8 g/dL (ref 30.0–36.0)
MCV: 84.9 fl (ref 78.0–100.0)
MONOS PCT: 6.5 % (ref 3.0–12.0)
Monocytes Absolute: 0.6 10*3/uL (ref 0.1–1.0)
NEUTROS PCT: 75.4 % (ref 43.0–77.0)
Neutro Abs: 6.7 10*3/uL (ref 1.4–7.7)
PLATELETS: 268 10*3/uL (ref 150.0–400.0)
RBC: 4.46 Mil/uL (ref 3.87–5.11)
RDW: 14.7 % (ref 11.5–15.5)
WBC: 8.9 10*3/uL (ref 4.0–10.5)

## 2013-12-15 LAB — BASIC METABOLIC PANEL
BUN: 17 mg/dL (ref 6–23)
CHLORIDE: 103 meq/L (ref 96–112)
CO2: 27 meq/L (ref 19–32)
CREATININE: 0.8 mg/dL (ref 0.4–1.2)
Calcium: 9.6 mg/dL (ref 8.4–10.5)
GFR: 79.51 mL/min (ref >60.00)
Glucose, Bld: 91 mg/dL (ref 70–99)
POTASSIUM: 4.1 meq/L (ref 3.5–5.1)
SODIUM: 138 meq/L (ref 135–145)

## 2013-12-16 ENCOUNTER — Inpatient Hospital Stay: Admission: RE | Admit: 2013-12-16 | Payer: Medicare HMO | Source: Ambulatory Visit

## 2013-12-16 ENCOUNTER — Other Ambulatory Visit: Payer: Medicare HMO

## 2013-12-19 ENCOUNTER — Encounter: Payer: Self-pay | Admitting: Internal Medicine

## 2013-12-19 ENCOUNTER — Encounter: Payer: Self-pay | Admitting: Family Medicine

## 2013-12-19 ENCOUNTER — Ambulatory Visit (HOSPITAL_COMMUNITY)
Admission: RE | Admit: 2013-12-19 | Discharge: 2013-12-19 | Disposition: A | Discharge status: Home / Self Care | Payer: Medicare HMO | Source: Ambulatory Visit | Attending: Internal Medicine | Admitting: Internal Medicine

## 2013-12-19 DIAGNOSIS — R109 Unspecified abdominal pain: Secondary | ICD-10-CM | POA: Insufficient documentation

## 2013-12-19 MED ORDER — IOHEXOL 300 MG/ML  SOLN
80.0000 mL | Freq: Once | INTRAMUSCULAR | Status: AC | PRN
  Administered 2013-12-19: 80 mL via INTRAVENOUS

## 2013-12-20 ENCOUNTER — Encounter: Payer: Self-pay | Admitting: Family Medicine

## 2013-12-20 ENCOUNTER — Encounter: Payer: Self-pay | Admitting: Internal Medicine

## 2013-12-21 ENCOUNTER — Encounter: Payer: Self-pay | Admitting: Family Medicine

## 2013-12-21 ENCOUNTER — Ambulatory Visit (INDEPENDENT_AMBULATORY_CARE_PROVIDER_SITE_OTHER): Payer: Medicare HMO | Admitting: Family Medicine

## 2013-12-21 VITALS — BP 130/82 | HR 74 | Wt 180.0 lb

## 2013-12-21 DIAGNOSIS — K769 Liver disease, unspecified: Secondary | ICD-10-CM

## 2013-12-21 DIAGNOSIS — K7689 Other specified diseases of liver: Secondary | ICD-10-CM

## 2013-12-21 DIAGNOSIS — IMO0002 Reserved for concepts with insufficient information to code with codable children: Secondary | ICD-10-CM

## 2013-12-21 DIAGNOSIS — R935 Abnormal findings on diagnostic imaging of other abdominal regions, including retroperitoneum: Secondary | ICD-10-CM

## 2013-12-21 DIAGNOSIS — M5416 Radiculopathy, lumbar region: Secondary | ICD-10-CM

## 2013-12-21 NOTE — Progress Notes (Signed)
Subjective:    Patient ID: Rebecca Reid, female    DOB: [birth year], 68 y.o.   MRN: [id number]  HPI Patient seen for followup regarding recent abnormal CT abdomen and pelvis. She was seen recently for left lumbar radiculopathy symptoms and is scheduled for MRI scan lumbar spine this coming Saturday. She was seen recently with acute right flank pain. She was sent for repeat CT abdomen and pelvis for evaluation of her flank pain and also to reassess previously noted benign-appearing lesion in her pancreas.  Patient's had previous total abdominal hysterectomy. Repeat CT revealed no increase in size of previously noted pancreas lesion and this was felt to be benign. She did however have low-attenuation lesion in the liver which is enlarged compared to previous study. There is comment of some thickening of the adjacent pararenal fascia. Patient not had any appetite or weight changes. No progressive abdominal pain. Recommendation was for MRI abdomen with and without contrast.  Patient also had incidental node of borderline cardiomegaly on her CT abdomen and pelvis as well as benign appearing adenomas of her left adrenal which are unchanged from previous study.  Her back pain is actually improved at this time. She's not any recent weakness left lower extremity.  Past Medical History  Diagnosis Date  . HYPERLIPIDEMIA 04/30/2009  . HYPERTENSION 04/30/2009  . ALLERGIC RHINITIS CAUSE UNSPECIFIED 04/30/2009  . GERD 04/30/2009  . OSTEOPENIA 04/30/2009  . LEG EDEMA 12/12/2009  . Dysuria 05/06/2010  . DEPRESSION, HX OF 04/11/2010   Past Surgical History  Procedure Laterality Date  . Breast enhancement surgery  1987  . Cholecystectomy  1974  . Abdominal hysterectomy  1986  . Breast surgery  1974    biopsy    reports that she has never smoked. She does not have any smokeless tobacco history on file. Her alcohol and drug histories are not on file. family history includes Arthritis in her other;  Cancer in her other; Emphysema in her mother; Heart disease (age of onset: 54) in her father; Heart disease (age of onset: 38) in her mother; Hyperlipidemia in her other; Hypertension in her other. Allergies  Allergen Reactions  . Erythromycin Base     REACTION: yeast infection, GI upset  . Naproxen     REACTION: GI upset  . Prednisone     REACTION: hives, anxiety, insomnia      Review of Systems  Constitutional: Negative for fever, chills, appetite change and unexpected weight change.  Respiratory: Negative for cough and shortness of breath.   Cardiovascular: Negative for chest pain and leg swelling.  Gastrointestinal: Positive for abdominal pain. Negative for nausea, vomiting, constipation, blood in stool, abdominal distention and rectal pain.  Genitourinary: Negative for dysuria.  Musculoskeletal: Positive for back pain.  Neurological: Negative for dizziness.  Hematological: Negative for adenopathy.       Objective:   Physical Exam  Constitutional: She appears well-developed and well-nourished.  Neck: Neck supple. No thyromegaly present.  Cardiovascular: Normal rate and regular rhythm.   Pulmonary/Chest: Effort normal and breath sounds normal. No respiratory distress. She has no wheezes. She has no rales.  Musculoskeletal: She exhibits no edema.  Lymphadenopathy:    She has no cervical adenopathy.          Assessment & Plan:  #1 recent abnormal CT abdomen and pelvis. Benign cystic appearing lesion in the pancreas which is actually smaller in size compared to previous study. She has had some enlargement of lesion of the liver as above. Set  up MRI abdomen to further assess as per radiology recommendations. She does not have any red flags such as appetite or weight change. #2 left lumbar radiculopathy symptoms which are actually slightly improved. She has pending MRI lumbar spine to further assess

## 2013-12-21 NOTE — Progress Notes (Signed)
Pre visit review using our clinic review tool, if applicable. No additional management support is needed unless otherwise documented below in the visit note. 

## 2013-12-24 ENCOUNTER — Ambulatory Visit
Admission: RE | Admit: 2013-12-24 | Discharge: 2013-12-24 | Disposition: A | Discharge status: Home / Self Care | Payer: Medicare HMO | Source: Ambulatory Visit | Attending: Family Medicine | Admitting: Family Medicine

## 2013-12-24 DIAGNOSIS — M5416 Radiculopathy, lumbar region: Secondary | ICD-10-CM

## 2013-12-29 ENCOUNTER — Ambulatory Visit
Admission: RE | Admit: 2013-12-29 | Discharge: 2013-12-29 | Disposition: A | Discharge status: Home / Self Care | Payer: Medicare HMO | Source: Ambulatory Visit | Attending: Family Medicine | Admitting: Family Medicine

## 2013-12-29 DIAGNOSIS — K769 Liver disease, unspecified: Secondary | ICD-10-CM

## 2013-12-29 DIAGNOSIS — R935 Abnormal findings on diagnostic imaging of other abdominal regions, including retroperitoneum: Secondary | ICD-10-CM

## 2013-12-29 MED ORDER — GADOBENATE DIMEGLUMINE 529 MG/ML IV SOLN
16.0000 mL | Freq: Once | INTRAVENOUS | Status: AC | PRN
  Administered 2013-12-29: 16 mL via INTRAVENOUS

## 2014-01-03 ENCOUNTER — Telehealth: Payer: Self-pay | Admitting: Family Medicine

## 2014-01-03 DIAGNOSIS — M5417 Radiculopathy, lumbosacral region: Secondary | ICD-10-CM

## 2014-01-03 NOTE — Telephone Encounter (Signed)
Pt is still waiting for NeuroSurgeon referral for back pain. Please order

## 2014-01-03 NOTE — Telephone Encounter (Signed)
Set up with Dr Arnoldo Morale or Ellene Route with [facility].

## 2014-01-03 NOTE — Telephone Encounter (Signed)
Referral is ordered

## 2014-01-12 ENCOUNTER — Encounter: Payer: Self-pay | Admitting: Family Medicine

## 2014-01-16 ENCOUNTER — Other Ambulatory Visit: Payer: Self-pay

## 2014-01-16 MED ORDER — HYDROCODONE-ACETAMINOPHEN 5-325 MG PO TABS: 1.0000 | ORAL_TABLET | Freq: Four times a day (QID) | ORAL | Status: DC | PRN

## 2014-02-15 ENCOUNTER — Telehealth: Payer: Self-pay | Admitting: Family Medicine

## 2014-02-15 NOTE — Telephone Encounter (Signed)
Pt needs new rx hydrocodone °

## 2014-02-15 NOTE — Telephone Encounter (Signed)
Last visit 12/21/13 Last refill 01/16/14 #30 0 refill

## 2014-02-16 MED ORDER — HYDROCODONE-ACETAMINOPHEN 5-325 MG PO TABS: 1.0000 | ORAL_TABLET | Freq: Four times a day (QID) | ORAL | Status: DC | PRN

## 2014-02-16 NOTE — Telephone Encounter (Signed)
Pt goes August 5th to see neurosurgeon. Pt aware that RX is ready for pick up

## 2014-02-16 NOTE — Telephone Encounter (Signed)
Has she seen neurosurgeon yet?  May refill once.

## 2014-03-01 ENCOUNTER — Encounter: Payer: Self-pay | Admitting: Family Medicine

## 2014-03-08 ENCOUNTER — Encounter: Payer: Self-pay | Admitting: Family Medicine

## 2014-03-23 ENCOUNTER — Encounter: Payer: Self-pay | Admitting: Family Medicine

## 2014-05-05 ENCOUNTER — Ambulatory Visit: Payer: Medicare HMO

## 2014-07-10 ENCOUNTER — Other Ambulatory Visit: Payer: Self-pay | Admitting: Family Medicine

## 2014-07-10 ENCOUNTER — Encounter: Payer: Self-pay | Admitting: Family Medicine

## 2014-07-10 ENCOUNTER — Ambulatory Visit (INDEPENDENT_AMBULATORY_CARE_PROVIDER_SITE_OTHER): Payer: Medicare HMO | Admitting: Family Medicine

## 2014-07-10 VITALS — BP 130/78 | HR 78 | Temp 97.4°F | Wt 180.0 lb

## 2014-07-10 DIAGNOSIS — B9789 Other viral agents as the cause of diseases classified elsewhere: Principal | ICD-10-CM

## 2014-07-10 DIAGNOSIS — J069 Acute upper respiratory infection, unspecified: Secondary | ICD-10-CM

## 2014-07-10 MED ORDER — HYDROCOD POLST-CHLORPHEN POLST 10-8 MG/5ML PO LQCR: 5.0000 mL | Freq: Two times a day (BID) | ORAL | Status: DC | PRN

## 2014-07-10 NOTE — Patient Instructions (Signed)

## 2014-07-10 NOTE — Progress Notes (Signed)
   Subjective:    Patient ID: Rebecca Reid, female    DOB: Dec 13, 1945, 68 y.o.   MRN: [id number]  HPI Acute visit. Patient seen with onset last Thursday of acute illness. She has cough, nasal congestion, malaise, hoarseness, intermittent headaches, diffuse sinus pressure. She's taken Sudafed, Mucinex, and Nasacort without much improvement. Her cough has been especially severe at night and not relieved with over-the-counter medications. No wheezing. No nausea or vomiting. Nonsmoker.  Past Medical History  Diagnosis Date  . HYPERLIPIDEMIA 04/30/2009  . HYPERTENSION 04/30/2009  . ALLERGIC RHINITIS CAUSE UNSPECIFIED 04/30/2009  . GERD 04/30/2009  . OSTEOPENIA 04/30/2009  . LEG EDEMA 12/12/2009  . Dysuria 05/06/2010  . DEPRESSION, HX OF 04/11/2010   Past Surgical History  Procedure Laterality Date  . Breast enhancement surgery  1987  . Cholecystectomy  1974  . Abdominal hysterectomy  1986  . Breast surgery  1974    biopsy    reports that she has never smoked. She does not have any smokeless tobacco history on file. Her alcohol and drug histories are not on file. family history includes Arthritis in her other; Cancer in her other; Emphysema in her mother; Heart disease (age of onset: 45) in her father; Heart disease (age of onset: 57) in her mother; Hyperlipidemia in her other; Hypertension in her other. Allergies  Allergen Reactions  . Erythromycin Base     REACTION: yeast infection, GI upset  . Naproxen     REACTION: GI upset  . Prednisone     REACTION: hives, anxiety, insomnia      Review of Systems  Constitutional: Positive for fatigue. Negative for fever and chills.  HENT: Positive for congestion, rhinorrhea, sinus pressure and sore throat.   Respiratory: Positive for cough. Negative for shortness of breath and wheezing.   Gastrointestinal: Negative for nausea and vomiting.  Neurological: Positive for headaches.       Objective:   Physical Exam  Constitutional: She  appears well-developed and well-nourished. No distress.  HENT:  Right Ear: External ear normal.  Left Ear: External ear normal.  Mouth/Throat: Oropharynx is clear and moist.  Neck: Neck supple.  Cardiovascular: Normal rate and regular rhythm.   Pulmonary/Chest: Effort normal and breath sounds normal. No respiratory distress. She has no wheezes. She has no rales.  Lymphadenopathy:    She has no cervical adenopathy.          Assessment & Plan:  Viral URI with cough. Nonfocal exam. Continue over-the-counter medications as needed for body aches. Push fluids. Tussionex 1 teaspoon daily at bedtime for severe cough. Follow-up for any fever or worsening symptoms. Work note written from today through 07/15/2014

## 2014-07-11 ENCOUNTER — Other Ambulatory Visit: Payer: Self-pay

## 2014-07-11 MED ORDER — ALBUTEROL SULFATE HFA 108 (90 BASE) MCG/ACT IN AERS: 2.0000 | INHALATION_SPRAY | Freq: Four times a day (QID) | RESPIRATORY_TRACT | Status: DC | PRN

## 2014-07-17 ENCOUNTER — Telehealth: Payer: Self-pay | Admitting: Family Medicine

## 2014-07-17 ENCOUNTER — Encounter: Payer: Self-pay | Admitting: Family Medicine

## 2014-07-17 NOTE — Telephone Encounter (Signed)
Pt would like a cb. Pt refused to elaborate.

## 2014-07-18 NOTE — Telephone Encounter (Signed)
Pt sent pt advice request will respond there messaging.

## 2014-08-01 ENCOUNTER — Other Ambulatory Visit: Payer: Self-pay | Admitting: Family Medicine

## 2014-09-13 ENCOUNTER — Ambulatory Visit: Payer: Medicare HMO | Admitting: Family Medicine

## 2014-11-21 ENCOUNTER — Ambulatory Visit (INDEPENDENT_AMBULATORY_CARE_PROVIDER_SITE_OTHER): Payer: Medicare HMO | Admitting: Family Medicine

## 2014-11-21 ENCOUNTER — Encounter: Payer: Self-pay | Admitting: Family Medicine

## 2014-11-21 VITALS — BP 110/80 | HR 78 | Temp 97.5°F | Wt 177.0 lb

## 2014-11-21 DIAGNOSIS — G4700 Insomnia, unspecified: Secondary | ICD-10-CM

## 2014-11-21 DIAGNOSIS — R42 Dizziness and giddiness: Secondary | ICD-10-CM

## 2014-11-21 DIAGNOSIS — E785 Hyperlipidemia, unspecified: Secondary | ICD-10-CM

## 2014-11-21 DIAGNOSIS — R5383 Other fatigue: Secondary | ICD-10-CM | POA: Diagnosis not present

## 2014-11-21 LAB — CBC WITH DIFFERENTIAL/PLATELET
BASOS PCT: 0.5 % (ref 0.0–3.0)
Basophils Absolute: 0 10*3/uL (ref 0.0–0.1)
EOS ABS: 0.1 10*3/uL (ref 0.0–0.7)
EOS PCT: 1.2 % (ref 0.0–5.0)
HCT: 41.8 % (ref 36.0–46.0)
Hemoglobin: 14 g/dL (ref 12.0–15.0)
LYMPHS PCT: 22.8 % (ref 12.0–46.0)
Lymphs Abs: 1.6 10*3/uL (ref 0.7–4.0)
MCHC: 33.4 g/dL (ref 30.0–36.0)
MCV: 83.6 fl (ref 78.0–100.0)
Monocytes Absolute: 0.7 10*3/uL (ref 0.1–1.0)
Monocytes Relative: 9.1 % (ref 3.0–12.0)
NEUTROS ABS: 4.8 10*3/uL (ref 1.4–7.7)
Neutrophils Relative %: 66.4 % (ref 43.0–77.0)
Platelets: 288 10*3/uL (ref 150.0–400.0)
RBC: 5 Mil/uL (ref 3.87–5.11)
RDW: 16.5 % — ABNORMAL HIGH (ref 11.5–15.5)
WBC: 7.2 10*3/uL (ref 4.0–10.5)

## 2014-11-21 LAB — LIPID PANEL
Cholesterol: 238 mg/dL — ABNORMAL HIGH (ref 0–200)
HDL: 70.3 mg/dL (ref >39.00)
LDL Cholesterol: 152 mg/dL — ABNORMAL HIGH (ref 0–99)
NONHDL: 167.7
Total CHOL/HDL Ratio: 3
Triglycerides: 81 mg/dL (ref 0.0–149.0)
VLDL: 16.2 mg/dL (ref 0.0–40.0)

## 2014-11-21 LAB — BASIC METABOLIC PANEL
BUN: 22 mg/dL (ref 6–23)
CO2: 30 mEq/L (ref 19–32)
CREATININE: 0.83 mg/dL (ref 0.40–1.20)
Calcium: 10 mg/dL (ref 8.4–10.5)
Chloride: 103 mEq/L (ref 96–112)
GFR: 72.71 mL/min (ref >60.00)
GLUCOSE: 81 mg/dL (ref 70–99)
Potassium: 3.9 mEq/L (ref 3.5–5.1)
Sodium: 139 mEq/L (ref 135–145)

## 2014-11-21 LAB — TSH: TSH: 2.04 u[IU]/mL (ref 0.35–4.50)

## 2014-11-21 NOTE — Progress Notes (Signed)
   Subjective:    Patient ID: Rebecca Reid, female    DOB: [birth year], 69 y.o.   MRN: [id number]  HPI   Patient seen with concerns for lightheadedness/dizziness, low blood pressure, and fatigue since around March. She takes losartan HCTZ 100/25 mg 1 daily. She's not had any recent nausea, vomiting, or diarrhea. Her appetite is fair. She has lost a few pounds due to her efforts with overall calorie restriction. She's had no syncope. She brings in log of home blood pressures with several in the 90 systolic and 93Z diastolic.  Other issue is difficulty sleeping. She has difficult falling asleep and also early morning wakening. Denies depression. She's tried both Tylenol PM and melatonin without any improvement. No consistent alcohol use. Avoids caffeine after about 2 or 3 PM.  Past Medical History  Diagnosis Date  . HYPERLIPIDEMIA 04/30/2009  . HYPERTENSION 04/30/2009  . ALLERGIC RHINITIS CAUSE UNSPECIFIED 04/30/2009  . GERD 04/30/2009  . OSTEOPENIA 04/30/2009  . LEG EDEMA 12/12/2009  . Dysuria 05/06/2010  . DEPRESSION, HX OF 04/11/2010   Past Surgical History  Procedure Laterality Date  . Breast enhancement surgery  1987  . Cholecystectomy  1974  . Abdominal hysterectomy  1986  . Breast surgery  1974    biopsy    reports that she has never smoked. She does not have any smokeless tobacco history on file. Her alcohol and drug histories are not on file. family history includes Arthritis in her other; Cancer in her other; Emphysema in her mother; Heart disease (age of onset: 57) in her father; Heart disease (age of onset: 43) in her mother; Hyperlipidemia in her other; Hypertension in her other. Allergies  Allergen Reactions  . Erythromycin Base     REACTION: yeast infection, GI upset  . Naproxen     REACTION: GI upset  . Prednisone     REACTION: hives, anxiety, insomnia      Review of Systems  Constitutional: Positive for fatigue. Negative for appetite change and unexpected  weight change.  Eyes: Negative for visual disturbance.  Respiratory: Negative for cough, chest tightness, shortness of breath and wheezing.   Cardiovascular: Negative for chest pain, palpitations and leg swelling.  Endocrine: Negative for polydipsia and polyuria.  Neurological: Positive for dizziness and light-headedness. Negative for seizures, syncope, weakness and headaches.  Psychiatric/Behavioral: Positive for sleep disturbance. Negative for dysphoric mood.       Objective:   Physical Exam  Constitutional: She is oriented to person, place, and time. She appears well-developed and well-nourished. No distress.  HENT:  Mouth/Throat: Oropharynx is clear and moist.  Neck: Neck supple. No thyromegaly present.  Cardiovascular: Normal rate and regular rhythm.   Pulmonary/Chest: Effort normal and breath sounds normal. No respiratory distress. She has no wheezes. She has no rales.  Musculoskeletal: She exhibits no edema.  Neurological: She is alert and oriented to person, place, and time. No cranial nerve deficit.          Assessment & Plan:  #1 dizziness. Blood pressure sitting left arm 122/70 and standing 102/60. Reduce losartan HCTZ to one half tablet daily. After 1-2 weeks if still has lightheadedness and low blood pressures discontinue altogether. Stay well-hydrated #2 insomnia. Sleep hygiene discussed. She wishes to avoid regular sedative hypnotics we agree with this.  #3 fatigue likely related to #1 and #2 above. Check labs with CBC, TSH, basic chemistries.

## 2014-11-21 NOTE — Patient Instructions (Signed)
Reduce Losartan-HCTZ to one half tablet daily Stay well hydrated.   Let me know in 1-2 weeks if still having dizziness.

## 2014-11-21 NOTE — Progress Notes (Signed)
Pre visit review using our clinic review tool, if applicable. No additional management support is needed unless otherwise documented below in the visit note. 

## 2014-12-03 ENCOUNTER — Encounter: Payer: Self-pay | Admitting: Family Medicine

## 2014-12-13 ENCOUNTER — Encounter: Payer: Self-pay | Admitting: *Deleted

## 2015-01-17 ENCOUNTER — Encounter: Payer: Self-pay | Admitting: Family Medicine

## 2015-03-27 ENCOUNTER — Encounter: Payer: Self-pay | Admitting: Family Medicine

## 2015-03-27 ENCOUNTER — Other Ambulatory Visit: Payer: Self-pay

## 2015-04-16 ENCOUNTER — Ambulatory Visit (INDEPENDENT_AMBULATORY_CARE_PROVIDER_SITE_OTHER): Payer: Medicare HMO | Admitting: Family Medicine

## 2015-04-16 DIAGNOSIS — Z23 Encounter for immunization: Secondary | ICD-10-CM | POA: Diagnosis not present

## 2015-06-04 ENCOUNTER — Other Ambulatory Visit: Payer: Self-pay | Admitting: Family Medicine

## 2015-06-21 DIAGNOSIS — M5417 Radiculopathy, lumbosacral region: Secondary | ICD-10-CM | POA: Diagnosis not present

## 2015-06-21 DIAGNOSIS — I10 Essential (primary) hypertension: Secondary | ICD-10-CM | POA: Diagnosis not present

## 2015-06-21 DIAGNOSIS — Z6829 Body mass index (BMI) 29.0-29.9, adult: Secondary | ICD-10-CM | POA: Diagnosis not present

## 2015-06-23 DIAGNOSIS — M5417 Radiculopathy, lumbosacral region: Secondary | ICD-10-CM | POA: Diagnosis not present

## 2015-06-23 DIAGNOSIS — M545 Low back pain: Secondary | ICD-10-CM | POA: Diagnosis not present

## 2015-07-03 DIAGNOSIS — M47816 Spondylosis without myelopathy or radiculopathy, lumbar region: Secondary | ICD-10-CM | POA: Diagnosis not present

## 2015-07-03 DIAGNOSIS — M5418 Radiculopathy, sacral and sacrococcygeal region: Secondary | ICD-10-CM | POA: Diagnosis not present

## 2015-07-03 DIAGNOSIS — M5127 Other intervertebral disc displacement, lumbosacral region: Secondary | ICD-10-CM | POA: Diagnosis not present

## 2015-07-27 DIAGNOSIS — M5116 Intervertebral disc disorders with radiculopathy, lumbar region: Secondary | ICD-10-CM | POA: Diagnosis not present

## 2015-07-27 DIAGNOSIS — M7138 Other bursal cyst, other site: Secondary | ICD-10-CM | POA: Diagnosis not present

## 2015-07-27 DIAGNOSIS — M5127 Other intervertebral disc displacement, lumbosacral region: Secondary | ICD-10-CM | POA: Diagnosis not present

## 2015-07-27 DIAGNOSIS — M5117 Intervertebral disc disorders with radiculopathy, lumbosacral region: Secondary | ICD-10-CM | POA: Diagnosis not present

## 2015-09-12 ENCOUNTER — Ambulatory Visit (INDEPENDENT_AMBULATORY_CARE_PROVIDER_SITE_OTHER): Payer: Medicare HMO | Admitting: Family Medicine

## 2015-09-12 VITALS — BP 128/88 | HR 95 | Temp 97.8°F | Ht 66.0 in | Wt 183.0 lb

## 2015-09-12 DIAGNOSIS — R21 Rash and other nonspecific skin eruption: Secondary | ICD-10-CM | POA: Diagnosis not present

## 2015-09-12 MED ORDER — DOXYCYCLINE HYCLATE 100 MG PO CAPS: 100.0000 mg | ORAL_CAPSULE | Freq: Two times a day (BID) | ORAL | Status: DC

## 2015-09-12 NOTE — Patient Instructions (Signed)
Touch base in one week if rash not better.

## 2015-09-12 NOTE — Progress Notes (Signed)
   Subjective:    Patient ID: Rebecca Reid, female    DOB: 1946/07/09, 70 y.o.   MRN: [id number]  HPI Acute visit for facial rash. Onset 1 week ago. Mostly pruritic. She states he started as red papules and couple had white or yellow center. Not using any makeup during the past week. Using Neutrogena facial cleanser. Denies any fever or chills. No exacerbating or alleviating factors. Denies any other areas of skin rash. One lesion left side of nose slightly crusted and yellow past couple days  Past Medical History  Diagnosis Date  . HYPERLIPIDEMIA 04/30/2009  . HYPERTENSION 04/30/2009  . ALLERGIC RHINITIS CAUSE UNSPECIFIED 04/30/2009  . GERD 04/30/2009  . OSTEOPENIA 04/30/2009  . LEG EDEMA 12/12/2009  . Dysuria 05/06/2010  . DEPRESSION, HX OF 04/11/2010   Past Surgical History  Procedure Laterality Date  . Breast enhancement surgery  1987  . Cholecystectomy  1974  . Abdominal hysterectomy  1986  . Breast surgery  1974    biopsy    reports that she has never smoked. She does not have any smokeless tobacco history on file. Her alcohol and drug histories are not on file. family history includes Arthritis in her other; Cancer in her other; Emphysema in her mother; Heart disease (age of onset: 31) in her father; Heart disease (age of onset: 71) in her mother; Hyperlipidemia in her other; Hypertension in her other. Allergies  Allergen Reactions  . Erythromycin Base     REACTION: yeast infection, GI upset  . Naproxen     REACTION: GI upset  . Prednisone     REACTION: hives, anxiety, insomnia      Review of Systems  Constitutional: Negative for fever and chills.  Skin: Positive for rash.  Hematological: Negative for adenopathy.       Objective:   Physical Exam  Constitutional: She appears well-developed and well-nourished. No distress.  Cardiovascular: Normal rate and regular rhythm.   Pulmonary/Chest: Effort normal and breath sounds normal. No respiratory distress.  She has no wheezes. She has no rales.  Skin: Rash noted.  She has a few scattered nonspecific approximately 1 mm erythematous papules on her face-cheek forehead and nose.  She has 1 small yellow crusted lesion left side of nose. No cellulitis changes.          Assessment & Plan:  Papular skin rash of face. Etiology unclear. This appears to be more follicular and not allergic. Doxycycline 100 mg twice a day for 10 days. Touch base if not clearing of the next week

## 2015-09-12 NOTE — Progress Notes (Signed)
Pre visit review using our clinic review tool, if applicable. No additional management support is needed unless otherwise documented below in the visit note. 

## 2015-09-19 ENCOUNTER — Encounter: Payer: Self-pay | Admitting: Family Medicine

## 2015-10-02 DIAGNOSIS — L821 Other seborrheic keratosis: Secondary | ICD-10-CM | POA: Diagnosis not present

## 2015-10-02 DIAGNOSIS — D1801 Hemangioma of skin and subcutaneous tissue: Secondary | ICD-10-CM | POA: Diagnosis not present

## 2015-10-02 DIAGNOSIS — L719 Rosacea, unspecified: Secondary | ICD-10-CM | POA: Diagnosis not present

## 2015-10-15 ENCOUNTER — Other Ambulatory Visit: Payer: Self-pay | Admitting: Family Medicine

## 2015-10-23 ENCOUNTER — Ambulatory Visit (INDEPENDENT_AMBULATORY_CARE_PROVIDER_SITE_OTHER): Payer: Medicare HMO | Admitting: Family Medicine

## 2015-10-23 VITALS — BP 140/110 | HR 90 | Temp 98.0°F | Ht 66.0 in | Wt 185.0 lb

## 2015-10-23 DIAGNOSIS — R002 Palpitations: Secondary | ICD-10-CM | POA: Diagnosis not present

## 2015-10-23 DIAGNOSIS — R0609 Other forms of dyspnea: Secondary | ICD-10-CM | POA: Diagnosis not present

## 2015-10-23 DIAGNOSIS — I10 Essential (primary) hypertension: Secondary | ICD-10-CM | POA: Diagnosis not present

## 2015-10-23 DIAGNOSIS — I493 Ventricular premature depolarization: Secondary | ICD-10-CM

## 2015-10-23 MED ORDER — METOPROLOL SUCCINATE ER 25 MG PO TB24
ORAL_TABLET | ORAL | Status: DC
Start: 2015-10-23 — End: 2015-11-07

## 2015-10-23 NOTE — Patient Instructions (Signed)
Palpitations A palpitation is the feeling that your heartbeat is irregular. It may feel like your heart is fluttering or skipping a beat. It may also feel like your heart is beating faster than normal. This is usually not a serious problem. In some cases, you may need more medical tests. HOME CARE  Avoid:  Caffeine in coffee, tea, soft drinks, diet pills, and energy drinks.  Chocolate.  Alcohol.  Stop smoking if you smoke.  Reduce your stress and anxiety. Try:  A method that measures bodily functions so you can learn to control them (biofeedback).  Yoga.  Meditation.  Physical activity such as swimming, jogging, or walking.  Get plenty of rest and sleep. GET HELP IF:  Your fast or irregular heartbeat continues after 24 hours.  Your palpitations occur more often. GET HELP RIGHT AWAY IF:   You have chest pain.  You feel short of breath.  You have a very bad headache.  You feel dizzy or pass out (faint). MAKE SURE YOU:   Understand these instructions.  Will watch your condition.  Will get help right away if you are not doing well or get worse.   This information is not intended to replace advice given to you by your health care provider. Make sure you discuss any questions you have with your health care provider.   Document Released: 04/15/2008 Document Revised: 07/28/2014 Document Reviewed: 09/05/2011 Elsevier Interactive Patient Education 2016 [employer] Inc[employer]  

## 2015-10-23 NOTE — Progress Notes (Signed)
   Subjective:    Patient ID: Rebecca Reid, female    DOB: 11/03/1945, 70 y.o.   MRN: [id number]  HPI Acute Visit  Patient seen today with a complaint of palpitations times two months.  Past medical history of hypertension , GERD, and hyperlipidemia.  She reports initially experiencing palpitations in the weeks following outpatient back surgery. She describes feeling heart beat close to her chest wall, sensation increases when lying down, and spontaneously resolves.  She also reports experiencing occasional fluttering sensation with her heart beat followed by a temporary pause.   She recently became concern as she is experiencing dyspnea with activity and fatigue. She denies any syncopal episodes, dizziness, or chest pain. She feels at times that she may be experiencing some upper chest tightness would not describe as pain. Denies any lower extremity swelling or new onset cough. Cannot identify any factors related to improvement or worsening of palpations.  Past Medical History  Diagnosis Date  . HYPERLIPIDEMIA 04/30/2009  . HYPERTENSION 04/30/2009  . ALLERGIC RHINITIS CAUSE UNSPECIFIED 04/30/2009  . GERD 04/30/2009  . OSTEOPENIA 04/30/2009  . LEG EDEMA 12/12/2009  . Dysuria 05/06/2010  . DEPRESSION, HX OF 04/11/2010   Past Surgical History  Procedure Laterality Date  . Breast enhancement surgery  1987  . Cholecystectomy  1974  . Abdominal hysterectomy  1986  . Breast surgery  1974    biopsy    reports that she has never smoked. She does not have any smokeless tobacco history on file. Her alcohol and drug histories are not on file. family history includes Arthritis in her other; Cancer in her other; Emphysema in her mother; Heart disease (age of onset: 44) in her father; Heart disease (age of onset: 80) in her mother; Hyperlipidemia in her other; Hypertension in her other. Allergies  Allergen Reactions  . Erythromycin Base     REACTION: yeast infection, GI upset  . Naproxen    REACTION: GI upset  . Prednisone     REACTION: hives, anxiety, insomnia      Review of Systems  Constitutional: Positive for activity change and fatigue.       See HPI  Respiratory: Positive for chest tightness.        See HPI  Cardiovascular: Positive for palpitations.  Skin: Negative.   Neurological: Negative.  Negative for dizziness, weakness and headaches.       Objective:   Physical Exam  Constitutional: She is oriented to person, place, and time. She appears well-developed and well-nourished.  HENT:  Head: Normocephalic and atraumatic.  Cardiovascular: Normal rate, regular rhythm, normal heart sounds and intact distal pulses.   Pulmonary/Chest: Effort normal and breath sounds normal.  Neurological: She is alert and oriented to person, place, and time.          Assessment & Plan:  Palpitations and exertional dyspnea/fatigue- Patient seen today with a two month history of new onset palpitations.  She presented today because of new onset dyspnea with exertion and fatigue have developed in conjunction with palpitations.  EKG revealed sinus arrhythmia with occasional PVCs.  No acute ST-T changes.  Plan: Stress testing indicated due to patient is now symptomatic with palpitations and recent reported dypnea and fatigue with exercise. Metoprolol XR 25 mg as needed once daily for palpitations.

## 2015-10-23 NOTE — Progress Notes (Signed)
Pre visit review using our clinic review tool, if applicable. No additional management support is needed unless otherwise documented below in the visit note. 

## 2015-10-24 ENCOUNTER — Telehealth: Payer: Self-pay

## 2015-10-24 NOTE — Telephone Encounter (Signed)
Spoke with Rebecca Reid  TO EXPLAIN  PATIENT NEEDS PRECERT - AND -Rebecca Reid WILL OBTAIN PRECERT IF NOT THEN PT WILL NEED TO  BE [place]- I NFORMED PATIENT OF STATUS - PATIENT IS GOING TO CALL PCP TO BE SURE AUTH IS IN PLACE AND PATIENT REQUESTED A CALL FROM A NURSE WITH INSTRUCTIONS ON HER NUC TEST

## 2015-10-25 ENCOUNTER — Telehealth: Payer: Self-pay

## 2015-10-25 ENCOUNTER — Encounter (HOSPITAL_COMMUNITY): Payer: Medicare HMO

## 2015-10-25 NOTE — Telephone Encounter (Signed)
I SPOKE WITH PATIENT FIRST THING THIS MORNING 8:00 IN WHICH SHE WAS TOLD BY PCP THAT THE CASE WAS IN PENDING  STATUS AND THAT IT WOULD BE [place]. PT WANTED TO BE SURE THAT SHE WOULD NOT BE CHARGED FOR NO SHOW FEE BEING THAT THE OFFICE WAS CLOSED AND SHE WAS UNABLE TO CALL TO BE SURE . I  THEN SPOKE  WITH INGRID (NUC)  WHO  WAS AWARE AND SHE STATED THAT PATIENT WOULD NOT BE CHARGED A NO SHOW FEE. PT IS APPT IS October 31 2015.   PT WAS VERY PLEASED WITH THE WAY I HANDLED THE SITUATION AND ASK FOR MY MANAGER TELEPHONE NUMBER TO SHARE THIS INFORMATION WITH HER. I GAVE HER Hoy Morn [id number]  .

## 2015-10-29 ENCOUNTER — Telehealth: Payer: Self-pay

## 2015-10-29 ENCOUNTER — Telehealth (HOSPITAL_COMMUNITY): Payer: Self-pay | Admitting: *Deleted

## 2015-10-29 NOTE — Telephone Encounter (Signed)
SPOKE WITH MRS Flock AND INFORMED HER THAT HER NUCLEAR STRESS TEST WAS APPROVED [facility] AUTH# [id number] GOOD THRU 01/26/2016   ORDERED BY DR Elease Hashimoto OFFICE

## 2015-10-29 NOTE — Telephone Encounter (Signed)
Patient given detailed instructions per Myocardial Perfusion Study Information Sheet for the test on 10/31/15 at 0730. Patient notified to arrive 15 minutes early and that it is imperative to arrive on time for appointment to keep from having the test rescheduled.  If you need to cancel or reschedule your appointment, please call the office within 24 hours of your appointment. Failure to do so may result in a cancellation of your appointment, and a $50 no show fee. Patient verbalized understanding.Rebecca Reid W    

## 2015-10-31 ENCOUNTER — Ambulatory Visit (HOSPITAL_COMMUNITY): Payer: Medicare HMO | Attending: Cardiovascular Disease

## 2015-10-31 DIAGNOSIS — R0609 Other forms of dyspnea: Secondary | ICD-10-CM | POA: Diagnosis not present

## 2015-10-31 DIAGNOSIS — I10 Essential (primary) hypertension: Secondary | ICD-10-CM | POA: Insufficient documentation

## 2015-10-31 DIAGNOSIS — R9439 Abnormal result of other cardiovascular function study: Secondary | ICD-10-CM | POA: Diagnosis not present

## 2015-10-31 DIAGNOSIS — R002 Palpitations: Secondary | ICD-10-CM

## 2015-10-31 DIAGNOSIS — R079 Chest pain, unspecified: Secondary | ICD-10-CM | POA: Insufficient documentation

## 2015-10-31 LAB — MYOCARDIAL PERFUSION IMAGING
CHL CUP MPHR: 152 {beats}/min
CHL CUP RESTING HR STRESS: 80 {beats}/min
CSEPEDS: 30 s
CSEPEW: 7 METS
Exercise duration (min): 5 min
LV sys vol: 39 mL
LVDIAVOL: 92 mL (ref 46–106)
NUC STRESS TID: 0.93
Peak HR: 137 {beats}/min
Percent HR: 90 %
RATE: 0.42
SDS: 0
SRS: 5
SSS: 5

## 2015-10-31 MED ORDER — TECHNETIUM TC 99M SESTAMIBI GENERIC - CARDIOLITE
30.8000 | Freq: Once | INTRAVENOUS | Status: AC | PRN
  Administered 2015-10-31: 30.8 via INTRAVENOUS

## 2015-10-31 MED ORDER — TECHNETIUM TC 99M SESTAMIBI GENERIC - CARDIOLITE
10.1000 | Freq: Once | INTRAVENOUS | Status: AC | PRN
  Administered 2015-10-31: 10.1 via INTRAVENOUS

## 2015-11-01 ENCOUNTER — Encounter: Payer: Self-pay | Admitting: Internal Medicine

## 2015-11-01 ENCOUNTER — Ambulatory Visit (INDEPENDENT_AMBULATORY_CARE_PROVIDER_SITE_OTHER): Payer: Medicare HMO | Admitting: Internal Medicine

## 2015-11-01 VITALS — BP 140/82 | HR 74 | Ht 66.0 in | Wt 182.6 lb

## 2015-11-01 DIAGNOSIS — R9439 Abnormal result of other cardiovascular function study: Secondary | ICD-10-CM

## 2015-11-01 LAB — BASIC METABOLIC PANEL
BUN: 23 mg/dL (ref 7–25)
CHLORIDE: 101 mmol/L (ref 98–110)
CO2: 25 mmol/L (ref 20–31)
Calcium: 9.6 mg/dL (ref 8.6–10.4)
Creat: 0.83 mg/dL (ref 0.50–0.99)
GLUCOSE: 95 mg/dL (ref 65–99)
POTASSIUM: 3.6 mmol/L (ref 3.5–5.3)
SODIUM: 137 mmol/L (ref 135–146)

## 2015-11-01 LAB — CBC WITH DIFFERENTIAL/PLATELET
Basophils Absolute: 86 cells/uL (ref 0–200)
Basophils Relative: 1 %
EOS ABS: 172 {cells}/uL (ref 15–500)
Eosinophils Relative: 2 %
HEMATOCRIT: 40.3 % (ref 35.0–45.0)
HEMOGLOBIN: 13.4 g/dL (ref 11.7–15.5)
LYMPHS ABS: 1892 {cells}/uL (ref 850–3900)
LYMPHS PCT: 22 %
MCH: 27.9 pg (ref 27.0–33.0)
MCHC: 33.3 g/dL (ref 32.0–36.0)
MCV: 84 fL (ref 80.0–100.0)
MONO ABS: 774 {cells}/uL (ref 200–950)
MPV: 9.1 fL (ref 7.5–12.5)
Monocytes Relative: 9 %
NEUTROS PCT: 66 %
Neutro Abs: 5676 cells/uL (ref 1500–7800)
Platelets: 279 10*3/uL (ref 140–400)
RBC: 4.8 MIL/uL (ref 3.80–5.10)
RDW: 14.9 % (ref 11.0–15.0)
WBC: 8.6 10*3/uL (ref 3.8–10.8)

## 2015-11-01 LAB — PROTIME-INR
INR: 0.97 (ref <1.50)
Prothrombin Time: 13 seconds (ref 11.6–15.2)

## 2015-11-01 NOTE — Patient Instructions (Addendum)
Medication Instructions:  Your physician recommends that you continue on your current medications as directed. Please refer to the Current Medication list given to you today.   Labwork: Your physician recommends that you return for lab work today: BMP/CBC/INR   Testing/Procedures: Your physician recommends that you continue on your current medications as directed. Please refer to the Current Medication list given to you today.  Please check in at the [facility] tomorrow 11/02/15 at 8:30am.  Do not eat or drink after midnight tonight.  Okay to take your medications tomorrow morning with a small sip of water  Follow-Up: Your physician recommends that you schedule a follow-up appointment in: 2 weeks with PA post cath   Any Other Special Instructions Will Be Listed Below (If Applicable).     If you need a refill on your cardiac medications before your next appointment, please call your pharmacy.

## 2015-11-01 NOTE — Progress Notes (Signed)
HPI Rebecca Reid is referred today for an unscheduled visit for evaluation of a strongly positive stress test. She is an otherwise healthy 70 yo woman with a h/o HTN and dyslipidemia who was referred for exercise stress testing and found to have a high risk defect and presents today for evaluation. Her anginal symptoms are both typical and atypical. She does have some dyspnea with exertion. She has not had syncope. She notes that her dyspnea with exertion has increased markedly over the past few weeks. This is associated with back and arm pain. She has also noted dizziness. No edema.  Allergies  Allergen Reactions  . Erythromycin Base Other (See Comments)    REACTION: yeast infection, GI upset  . Naproxen Other (See Comments)    REACTION: GI upset  . Prednisone Other (See Comments)    REACTION: hives, anxiety, insomnia     Current Outpatient Prescriptions  Medication Sig Dispense Refill  . aspirin 81 MG tablet Take 81 mg by mouth daily.      . cetirizine (ZYRTEC) 5 MG tablet Take 5 mg by mouth as needed for allergies.    Marland Kitchen losartan-hydrochlorothiazide (HYZAAR) 100-25 MG tablet Take 0.5 tablets by mouth as directed.    . metoprolol succinate (TOPROL-XL) 25 MG 24 hr tablet One po daily as needed for palpitations. 30 tablet 6  . omeprazole (PRILOSEC) 20 MG capsule Take 20 mg by mouth 2 (two) times daily.     . sertraline (ZOLOFT) 50 MG tablet Take 1 tablet (50 mg total) by mouth daily. 90 tablet 3   No current facility-administered medications for this visit.     Past Medical History  Diagnosis Date  . HYPERLIPIDEMIA 04/30/2009  . HYPERTENSION 04/30/2009  . ALLERGIC RHINITIS CAUSE UNSPECIFIED 04/30/2009  . GERD 04/30/2009  . OSTEOPENIA 04/30/2009  . LEG EDEMA 12/12/2009  . Dysuria 05/06/2010  . DEPRESSION, HX OF 04/11/2010    ROS:   All systems reviewed and negative except as noted in the HPI.   Past Surgical History  Procedure Laterality Date  . Breast enhancement  surgery  1987  . Cholecystectomy  1974  . Abdominal hysterectomy  1986  . Breast surgery  1974    biopsy     Family History  Problem Relation Age of Onset  . Emphysema Mother     heavy smoker  . Heart disease Mother 72  . Arthritis Other   . Hyperlipidemia Other   . Hypertension Other   . Cancer Other     prostate  . Heart disease Father 52     Social History   Social History  . Marital Status: Married    Spouse Name: N/A  . Number of Children: N/A  . Years of Education: N/A   Occupational History  . Not on file.   Social History Main Topics  . Smoking status: Never Smoker   . Smokeless tobacco: Not on file  . Alcohol Use: Not on file  . Drug Use: Not on file  . Sexual Activity: Not on file   Other Topics Concern  . Not on file   Social History Narrative     BP 140/82 mmHg  Pulse 74  Ht 5\' 6"  (1.676 m)  Wt 182 lb 9.6 oz (82.827 kg)  BMI 29.49 kg/m2  SpO2 98%  Physical Exam:  Well appearing 71 yo woman, NAD HEENT: Unremarkable Neck:  6 cm JVD, no thyromegally Lymphatics:  No adenopathy Back:  No CVA tenderness Lungs:  Clear with no wheezes HEART:  Regular rate rhythm, no murmurs, no rubs, no clicks Abd:  soft, positive bowel sounds, no organomegally, no rebound, no guarding Ext:  2 plus pulses, no edema, no cyanosis, no clubbing Skin:  No rashes no nodules Neuro:  CN II through XII intact, motor grossly intact  EKG - nsr with no stt changes   Assess/Plan: 1. Chest pain with typical ant atypical features and a positive stress test (high risk study) - I have recommended she undergo left heart catheterization and she wishes to proceed. This will be scheduled as soon as possible. 2. HTN - her blood pressure is elevated slightly but no S4 on exam. 3. Dyslipidemia - continue statin therapy  Mikle Bosworth.D.

## 2015-11-02 ENCOUNTER — Ambulatory Visit (HOSPITAL_COMMUNITY)
Admission: RE | Admit: 2015-11-02 | Discharge: 2015-11-02 | Disposition: A | Discharge status: Home / Self Care | Payer: Medicare HMO | Source: Ambulatory Visit | Attending: Interventional Cardiology | Admitting: Interventional Cardiology

## 2015-11-02 ENCOUNTER — Encounter (HOSPITAL_COMMUNITY)
Admission: RE | Disposition: A | Discharge status: Home / Self Care | Payer: Self-pay | Source: Ambulatory Visit | Attending: Interventional Cardiology

## 2015-11-02 DIAGNOSIS — R69 Illness, unspecified: Secondary | ICD-10-CM | POA: Diagnosis not present

## 2015-11-02 DIAGNOSIS — Z7982 Long term (current) use of aspirin: Secondary | ICD-10-CM | POA: Insufficient documentation

## 2015-11-02 DIAGNOSIS — R931 Abnormal findings on diagnostic imaging of heart and coronary circulation: Secondary | ICD-10-CM | POA: Diagnosis not present

## 2015-11-02 DIAGNOSIS — F329 Major depressive disorder, single episode, unspecified: Secondary | ICD-10-CM | POA: Diagnosis not present

## 2015-11-02 DIAGNOSIS — R072 Precordial pain: Secondary | ICD-10-CM

## 2015-11-02 DIAGNOSIS — Z79899 Other long term (current) drug therapy: Secondary | ICD-10-CM | POA: Insufficient documentation

## 2015-11-02 DIAGNOSIS — J309 Allergic rhinitis, unspecified: Secondary | ICD-10-CM | POA: Diagnosis not present

## 2015-11-02 DIAGNOSIS — I10 Essential (primary) hypertension: Secondary | ICD-10-CM | POA: Diagnosis not present

## 2015-11-02 DIAGNOSIS — K219 Gastro-esophageal reflux disease without esophagitis: Secondary | ICD-10-CM | POA: Insufficient documentation

## 2015-11-02 DIAGNOSIS — E785 Hyperlipidemia, unspecified: Secondary | ICD-10-CM | POA: Diagnosis not present

## 2015-11-02 DIAGNOSIS — R9439 Abnormal result of other cardiovascular function study: Secondary | ICD-10-CM

## 2015-11-02 HISTORY — PX: CARDIAC CATHETERIZATION: SHX172

## 2015-11-02 SURGERY — LEFT HEART CATH AND CORONARY ANGIOGRAPHY
Anesthesia: LOCAL

## 2015-11-02 MED ORDER — SODIUM CHLORIDE 0.9% FLUSH: 3.0000 mL | Freq: Two times a day (BID) | INTRAVENOUS | Status: DC

## 2015-11-02 MED ORDER — ASPIRIN 81 MG PO CHEW: 81.0000 mg | CHEWABLE_TABLET | ORAL | Status: DC

## 2015-11-02 MED ORDER — HEPARIN (PORCINE) IN NACL 2-0.9 UNIT/ML-% IJ SOLN
INTRAMUSCULAR | Status: DC | PRN
  Administered 2015-11-02: 14:00:00 via INTRA_ARTERIAL

## 2015-11-02 MED ORDER — SODIUM CHLORIDE 0.9 % IV SOLN: 250.0000 mL | INTRAVENOUS | Status: DC | PRN

## 2015-11-02 MED ORDER — LIDOCAINE HCL (PF) 1 % IJ SOLN
INTRAMUSCULAR | Status: DC | PRN
  Administered 2015-11-02: 2 mL

## 2015-11-02 MED ORDER — HEPARIN SODIUM (PORCINE) 1000 UNIT/ML IJ SOLN
INTRAMUSCULAR | Status: DC | PRN
  Administered 2015-11-02: 4000 [IU] via INTRAVENOUS

## 2015-11-02 MED ORDER — DIAZEPAM 5 MG PO TABS
ORAL_TABLET | ORAL | Status: AC
  Administered 2015-11-02: 5 mg via ORAL
  Filled 2015-11-02: qty 1

## 2015-11-02 MED ORDER — DIAZEPAM 5 MG PO TABS
5.0000 mg | ORAL_TABLET | Freq: Once | ORAL | Status: AC
  Administered 2015-11-02: 5 mg via ORAL

## 2015-11-02 MED ORDER — MIDAZOLAM HCL 2 MG/2ML IJ SOLN
INTRAMUSCULAR | Status: AC
  Filled 2015-11-02: qty 2

## 2015-11-02 MED ORDER — FENTANYL CITRATE (PF) 100 MCG/2ML IJ SOLN
INTRAMUSCULAR | Status: DC | PRN
  Administered 2015-11-02: 50 ug via INTRAVENOUS

## 2015-11-02 MED ORDER — HEPARIN (PORCINE) IN NACL 2-0.9 UNIT/ML-% IJ SOLN
INTRAMUSCULAR | Status: AC
  Filled 2015-11-02: qty 1500

## 2015-11-02 MED ORDER — VERAPAMIL HCL 2.5 MG/ML IV SOLN
INTRAVENOUS | Status: AC
  Filled 2015-11-02: qty 2

## 2015-11-02 MED ORDER — HEPARIN SODIUM (PORCINE) 1000 UNIT/ML IJ SOLN
INTRAMUSCULAR | Status: AC
  Filled 2015-11-02: qty 1

## 2015-11-02 MED ORDER — HEPARIN (PORCINE) IN NACL 2-0.9 UNIT/ML-% IJ SOLN
INTRAMUSCULAR | Status: DC | PRN
  Administered 2015-11-02: 1500 mL

## 2015-11-02 MED ORDER — SODIUM CHLORIDE 0.9 % WEIGHT BASED INFUSION
3.0000 mL/kg/h | INTRAVENOUS | Status: AC
  Administered 2015-11-02: 3 mL/kg/h via INTRAVENOUS

## 2015-11-02 MED ORDER — IOPAMIDOL (ISOVUE-370) INJECTION 76%
INTRAVENOUS | Status: AC
  Filled 2015-11-02: qty 100

## 2015-11-02 MED ORDER — MIDAZOLAM HCL 2 MG/2ML IJ SOLN
INTRAMUSCULAR | Status: DC | PRN
  Administered 2015-11-02 (×2): 1 mg via INTRAVENOUS

## 2015-11-02 MED ORDER — IOPAMIDOL (ISOVUE-370) INJECTION 76%
INTRAVENOUS | Status: DC | PRN
  Administered 2015-11-02: 50 mL via INTRAVENOUS

## 2015-11-02 MED ORDER — SODIUM CHLORIDE 0.9% FLUSH: 3.0000 mL | INTRAVENOUS | Status: DC | PRN

## 2015-11-02 MED ORDER — FENTANYL CITRATE (PF) 100 MCG/2ML IJ SOLN
INTRAMUSCULAR | Status: AC
  Filled 2015-11-02: qty 2

## 2015-11-02 MED ORDER — LIDOCAINE HCL (PF) 1 % IJ SOLN
INTRAMUSCULAR | Status: AC
  Filled 2015-11-02: qty 30

## 2015-11-02 MED ORDER — SODIUM CHLORIDE 0.9 % WEIGHT BASED INFUSION: 1.0000 mL/kg/h | INTRAVENOUS | Status: DC

## 2015-11-02 SURGICAL SUPPLY — 10 items
CATH INFINITI 5 FR JL3.5 (CATHETERS) ×2 IMPLANT
CATH INFINITI JR4 5F (CATHETERS) ×2 IMPLANT
DEVICE RAD COMP TR BAND LRG (VASCULAR PRODUCTS) ×2 IMPLANT
GLIDESHEATH SLEND A-KIT 6F 22G (SHEATH) ×2 IMPLANT
KIT HEART LEFT (KITS) ×2 IMPLANT
PACK CARDIAC CATHETERIZATION (CUSTOM PROCEDURE TRAY) ×2 IMPLANT
TRANSDUCER W/STOPCOCK (MISCELLANEOUS) ×2 IMPLANT
TUBING CIL FLEX 10 FLL-RA (TUBING) ×2 IMPLANT
WIRE HI TORQ VERSACORE-J 145CM (WIRE) ×2 IMPLANT
WIRE SAFE-T 1.5MM-J .035X260CM (WIRE) ×2 IMPLANT

## 2015-11-02 NOTE — Interval H&P Note (Signed)
Cath Lab Visit (complete for each Cath Lab visit)  Clinical Evaluation Leading to the Procedure:   ACS: No.  Non-ACS:    Anginal Classification: CCS III  Anti-ischemic medical therapy: Minimal Therapy (1 class of medications)  Non-Invasive Test Results: High-risk stress test findings: cardiac mortality >3%/year  Prior CABG: No previous CABG      Cath Lab Visit (complete for each Cath Lab visit)  Clinical Evaluation Leading to the Procedure:   ACS: No.  Non-ACS:    Anginal Classification: CCS III  Anti-ischemic medical therapy: Minimal Therapy (1 class of medications)  Non-Invasive Test Results: High-risk stress test findings: cardiac mortality >3%/year  Prior CABG: Previous CABG      History and Physical Interval Note:  11/02/2015 1:45 PM  Rebecca Reid  has presented today for surgery, with the diagnosis of abnormal stress test  The various methods of treatment have been discussed with the patient and family. After consideration of risks, benefits and other options for treatment, the patient has consented to  Procedure(s): Left Heart Cath and Coronary Angiography (N/A) as a surgical intervention .  The patient's history has been reviewed, patient examined, no change in status, stable for surgery.  I have reviewed the patient's chart and labs.  Questions were answered to the patient's satisfaction.     Sinclair Grooms

## 2015-11-02 NOTE — Discharge Instructions (Signed)
Radial Site Care °Refer to this sheet in the next few weeks. These instructions provide you with information about caring for yourself after your procedure. Your health care provider may also give you more specific instructions. Your treatment has been planned according to current medical practices, but problems sometimes occur. Call your health care provider if you have any problems or questions after your procedure. °WHAT TO EXPECT AFTER THE PROCEDURE °After your procedure, it is typical to have the following: °· Bruising at the radial site that usually fades within 1-2 weeks. °· Blood collecting in the tissue (hematoma) that may be painful to the touch. It should usually decrease in size and tenderness within 1-2 weeks. °HOME CARE INSTRUCTIONS °· Take medicines only as directed by your health care provider. °· You may shower 24-48 hours after the procedure or as directed by your health care provider. Remove the bandage (dressing) and gently wash the site with plain soap and water. Pat the area dry with a clean towel. Do not rub the site, because this may cause bleeding. °· Do not take baths, swim, or use a hot tub until your health care provider approves. °· Check your insertion site every day for redness, swelling, or drainage. °· Do not apply powder or lotion to the site. °· Do not flex or bend the affected arm for 24 hours or as directed by your health care provider. °· Do not push or pull heavy objects with the affected arm for 24 hours or as directed by your health care provider. °· Do not lift over 10 lb (4.5 kg) for 5 days after your procedure or as directed by your health care provider. °· Ask your health care provider when it is okay to: °¨ Return to work or school. °¨ Resume usual physical activities or sports. °¨ Resume sexual activity. °· Do not drive home if you are discharged the same day as the procedure. Have someone else drive you. °· You may drive 24 hours after the procedure unless otherwise  instructed by your health care provider. °· Do not operate machinery or power tools for 24 hours after the procedure. °· If your procedure was done as an outpatient procedure, which means that you went home the same day as your procedure, a responsible adult should be with you for the first 24 hours after you arrive home. °· Keep all follow-up visits as directed by your health care provider. This is important. °SEEK MEDICAL CARE IF: °· You have a fever. °· You have chills. °· You have increased bleeding from the radial site. Hold pressure on the site. °SEEK IMMEDIATE MEDICAL CARE IF: °· You have unusual pain at the radial site. °· You have redness, warmth, or swelling at the radial site. °· You have drainage (other than a small amount of blood on the dressing) from the radial site. °· The radial site is bleeding, and the bleeding does not stop after 30 minutes of holding steady pressure on the site. °· Your arm or hand becomes pale, cool, tingly, or numb. °  °This information is not intended to replace advice given to you by your health care provider. Make sure you discuss any questions you have with your health care provider. °  °Document Released: 08/09/2010 Document Revised: 07/28/2014 Document Reviewed: 01/23/2014 °Elsevier Interactive Patient Education ©2016 [employer] ° °

## 2015-11-02 NOTE — H&P (View-Only) (Signed)
HPI Mrs. Jansen is referred today for an unscheduled visit for evaluation of a strongly positive stress test. She is an otherwise healthy 70 yo woman with a h/o HTN and dyslipidemia who was referred for exercise stress testing and found to have a high risk defect and presents today for evaluation. Her anginal symptoms are both typical and atypical. She does have some dyspnea with exertion. She has not had syncope. She notes that her dyspnea with exertion has increased markedly over the past few weeks. This is associated with back and arm pain. She has also noted dizziness. No edema.  Allergies  Allergen Reactions  . Erythromycin Base Other (See Comments)    REACTION: yeast infection, GI upset  . Naproxen Other (See Comments)    REACTION: GI upset  . Prednisone Other (See Comments)    REACTION: hives, anxiety, insomnia     Current Outpatient Prescriptions  Medication Sig Dispense Refill  . aspirin 81 MG tablet Take 81 mg by mouth daily.      . cetirizine (ZYRTEC) 5 MG tablet Take 5 mg by mouth as needed for allergies.    Marland Kitchen losartan-hydrochlorothiazide (HYZAAR) 100-25 MG tablet Take 0.5 tablets by mouth as directed.    . metoprolol succinate (TOPROL-XL) 25 MG 24 hr tablet One po daily as needed for palpitations. 30 tablet 6  . omeprazole (PRILOSEC) 20 MG capsule Take 20 mg by mouth 2 (two) times daily.     . sertraline (ZOLOFT) 50 MG tablet Take 1 tablet (50 mg total) by mouth daily. 90 tablet 3   No current facility-administered medications for this visit.     Past Medical History  Diagnosis Date  . HYPERLIPIDEMIA 04/30/2009  . HYPERTENSION 04/30/2009  . ALLERGIC RHINITIS CAUSE UNSPECIFIED 04/30/2009  . GERD 04/30/2009  . OSTEOPENIA 04/30/2009  . LEG EDEMA 12/12/2009  . Dysuria 05/06/2010  . DEPRESSION, HX OF 04/11/2010    ROS:   All systems reviewed and negative except as noted in the HPI.   Past Surgical History  Procedure Laterality Date  . Breast enhancement  surgery  1987  . Cholecystectomy  1974  . Abdominal hysterectomy  1986  . Breast surgery  1974    biopsy     Family History  Problem Relation Age of Onset  . Emphysema Mother     heavy smoker  . Heart disease Mother 79  . Arthritis Other   . Hyperlipidemia Other   . Hypertension Other   . Cancer Other     prostate  . Heart disease Father 45     Social History   Social History  . Marital Status: Married    Spouse Name: N/A  . Number of Children: N/A  . Years of Education: N/A   Occupational History  . Not on file.   Social History Main Topics  . Smoking status: Never Smoker   . Smokeless tobacco: Not on file  . Alcohol Use: Not on file  . Drug Use: Not on file  . Sexual Activity: Not on file   Other Topics Concern  . Not on file   Social History Narrative     BP 140/82 mmHg  Pulse 74  Ht 5\' 6"  (1.676 m)  Wt 182 lb 9.6 oz (82.827 kg)  BMI 29.49 kg/m2  SpO2 98%  Physical Exam:  Well appearing 70 yo woman, NAD HEENT: Unremarkable Neck:  6 cm JVD, no thyromegally Lymphatics:  No adenopathy Back:  No CVA tenderness Lungs:  Clear with no wheezes HEART:  Regular rate rhythm, no murmurs, no rubs, no clicks Abd:  soft, positive bowel sounds, no organomegally, no rebound, no guarding Ext:  2 plus pulses, no edema, no cyanosis, no clubbing Skin:  No rashes no nodules Neuro:  CN II through XII intact, motor grossly intact  EKG - nsr with no stt changes   Assess/Plan: 1. Chest pain with typical ant atypical features and a positive stress test (high risk study) - I have recommended she undergo left heart catheterization and she wishes to proceed. This will be scheduled as soon as possible. 2. HTN - her blood pressure is elevated slightly but no S4 on exam. 3. Dyslipidemia - continue statin therapy  Mikle Bosworth.D.

## 2015-11-02 NOTE — Progress Notes (Signed)
Pt c/o feeling very nervous and scared, pt was reassured and Dr Linard Millers was paged/ call was returned and orders followed.

## 2015-11-02 NOTE — Research (Signed)
CADLAD Informed Consent   Subject Name: Rebecca Reid  Subject met inclusion and exclusion criteria.  The informed consent form, study requirements and expectations were reviewed with the subject and questions and concerns were addressed prior to the signing of the consent form.  The subject verbalized understanding of the trail requirements.  The subject agreed to participate in the CADLAD trial and signed the informed consent.  The informed consent was obtained prior to performance of any protocol-specific procedures for the subject.  A copy of the signed informed consent was given to the subject and a copy was placed in the subject's medical record.  Sandie Ano 11/02/2015, 9:15

## 2015-11-05 ENCOUNTER — Encounter (HOSPITAL_COMMUNITY): Payer: Self-pay | Admitting: Interventional Cardiology

## 2015-11-07 ENCOUNTER — Ambulatory Visit (INDEPENDENT_AMBULATORY_CARE_PROVIDER_SITE_OTHER): Payer: Medicare HMO | Admitting: Family Medicine

## 2015-11-07 VITALS — BP 128/92 | HR 76 | Temp 98.2°F | Ht 66.0 in | Wt 185.1 lb

## 2015-11-07 DIAGNOSIS — R002 Palpitations: Secondary | ICD-10-CM

## 2015-11-07 DIAGNOSIS — R079 Chest pain, unspecified: Secondary | ICD-10-CM | POA: Diagnosis not present

## 2015-11-07 MED ORDER — METOPROLOL SUCCINATE ER 50 MG PO TB24
50.0000 mg | ORAL_TABLET | Freq: Every day | ORAL | Status: DC
Start: 2015-11-07 — End: 2016-11-05

## 2015-11-07 NOTE — Progress Notes (Signed)
Pre visit review using our clinic review tool, if applicable. No additional management support is needed unless otherwise documented below in the visit note. 

## 2015-11-07 NOTE — Patient Instructions (Signed)
Go ahead and increase the Toprol XL  Minimize caffeine intake Let me know if palpitations not improved with change in dose of Toprol as above.

## 2015-11-07 NOTE — Progress Notes (Signed)
Subjective:    Patient ID: Rebecca Reid, female    DOB: [birth year], 70 y.o.   MRN: [id number]  HPI    Patient seen for follow-up regarding recent palpitations and dyspnea with exertion. We sent her for nuclear stress evaluation which showed high risk study with question of reversible ischemia. She had catheterization on the 14th of this month which showed normal coronary arteries with normal left ventricular function. False positive myocardial perfusion study.   Patient continues to have palpitations which appear to be worse at night but sometimes during the day as well. Minimal caffeine use. No syncope. Still has occasional heavy sensation in her substernal chest. Denies any pain with swallowing or dysphagia. She has long history of GERD and is maintained on omeprazole and fairly well controlled. Hypertension treated with losartan HCTZ and she also takes low-dose metoprolol 25 mg once daily. She has scheduled follow-up with cardiology in 2 weeks  Past Medical History  Diagnosis Date  . HYPERLIPIDEMIA 04/30/2009  . HYPERTENSION 04/30/2009  . ALLERGIC RHINITIS CAUSE UNSPECIFIED 04/30/2009  . GERD 04/30/2009  . OSTEOPENIA 04/30/2009  . LEG EDEMA 12/12/2009  . Dysuria 05/06/2010  . DEPRESSION, HX OF 04/11/2010   Past Surgical History  Procedure Laterality Date  . Breast enhancement surgery  1987  . Cholecystectomy  1974  . Abdominal hysterectomy  1986  . Breast surgery  1974    biopsy  . Cardiac catheterization N/A 11/02/2015    Procedure: Left Heart Cath and Coronary Angiography;  Surgeon: Belva Crome, MD;  Location: [facility];  Service: Cardiovascular;  Laterality: N/A;    reports that she has never smoked. She does not have any smokeless tobacco history on file. Her alcohol and drug histories are not on file. family history includes Arthritis in her other; Cancer in her other; Emphysema in her mother; Heart disease (age of onset: 59) in her father; Heart disease (age of  onset: 8) in her mother; Hyperlipidemia in her other; Hypertension in her other. Allergies  Allergen Reactions  . Erythromycin Base Other (See Comments)    yeast infection, GI upset  . Naproxen Other (See Comments)    GI upset  . Prednisone Hives    REACTION: hives, anxiety, insomnia  . Scallops [Shellfish Allergy] Nausea And Vomiting      Review of Systems  Constitutional: Positive for fatigue.  Eyes: Negative for visual disturbance.  Respiratory: Negative for cough, chest tightness, shortness of breath and wheezing.   Cardiovascular: Positive for palpitations. Negative for chest pain and leg swelling.  Gastrointestinal: Negative for abdominal pain.  Musculoskeletal: Negative for back pain.  Neurological: Negative for dizziness, seizures, syncope, weakness, light-headedness and headaches.       Objective:   Physical Exam  Constitutional: She appears well-developed and well-nourished. No distress.  Neck: Neck supple. No thyromegaly present.  Cardiovascular: Normal rate and regular rhythm.   Pulmonary/Chest: Effort normal and breath sounds normal. No respiratory distress. She has no wheezes. She has no rales.  Musculoskeletal: She exhibits no edema.  Lymphadenopathy:    She has no cervical adenopathy.          Assessment & Plan:  Palpitations and recent chest pain with false positive nuclear stress test with normal coronaries on angiography as above. Question symptomatic PVCs or PACs. Continue to minimize caffeine use. Increase Toprol-XL to 50 mg once daily. Touch base if symptoms not improved over the next couple weeks. She has scheduled follow-up with cardiology in 2 weeks. Consider  event monitor if symptoms persist

## 2015-11-13 ENCOUNTER — Encounter: Payer: Self-pay | Admitting: Family Medicine

## 2015-11-13 NOTE — Telephone Encounter (Signed)
What is Rd?

## 2015-11-19 ENCOUNTER — Ambulatory Visit (INDEPENDENT_AMBULATORY_CARE_PROVIDER_SITE_OTHER): Payer: Medicare HMO | Admitting: Physician Assistant

## 2015-11-19 ENCOUNTER — Encounter: Payer: Self-pay | Admitting: Physician Assistant

## 2015-11-19 VITALS — BP 140/80 | HR 64 | Ht 66.0 in | Wt 184.4 lb

## 2015-11-19 DIAGNOSIS — Z09 Encounter for follow-up examination after completed treatment for conditions other than malignant neoplasm: Secondary | ICD-10-CM | POA: Diagnosis not present

## 2015-11-19 DIAGNOSIS — Z0389 Encounter for observation for other suspected diseases and conditions ruled out: Secondary | ICD-10-CM | POA: Diagnosis not present

## 2015-11-19 DIAGNOSIS — R931 Abnormal findings on diagnostic imaging of heart and coronary circulation: Secondary | ICD-10-CM

## 2015-11-19 DIAGNOSIS — IMO0001 Reserved for inherently not codable concepts without codable children: Secondary | ICD-10-CM | POA: Insufficient documentation

## 2015-11-19 DIAGNOSIS — R002 Palpitations: Secondary | ICD-10-CM | POA: Insufficient documentation

## 2015-11-19 DIAGNOSIS — I10 Essential (primary) hypertension: Secondary | ICD-10-CM | POA: Diagnosis not present

## 2015-11-19 DIAGNOSIS — R9439 Abnormal result of other cardiovascular function study: Secondary | ICD-10-CM

## 2015-11-19 NOTE — Patient Instructions (Signed)
Your physician wants you to follow-up in: 6 months with Dr Taylor You will receive a reminder letter in the mail two months in advance. If you don't receive a letter, please call our office to schedule the follow-up appointment.  

## 2015-11-19 NOTE — Progress Notes (Signed)
Cardiology Office Note    Date:  11/19/2015   ID:  Rebecca Reid, Rebecca Reid 1945-10-18, MRN [id number]  PCP:  [name], MD  Cardiologist:  Dr. Lovena Le  Chief Complaint  Patient presents with  . Follow-up    no sx    History of Present Illness:  Rebecca Reid is a 70 y.o. female  patient with recent abnormal stress test that was admitted for cardiac catheterization 11/02/15 that showed normal coronary arteries and normal LV function. Patient was also having a lot of palpitations and her metoprolol was increased by Dr. Elease Hashimoto. She says this is helped a lot and she has not had any further problems with palpitations. They were so bad they were keeping her up at night. She still drinks 2-3 cups of coffee every morning but only half caffeine half decaf. She joined [employer] on Friday and is excised to lose weight and begin exercising. She did notice when walking up an incline on her walk yesterday she didn't feel like she could go she usually does.    Past Medical History  Diagnosis Date  . HYPERLIPIDEMIA 04/30/2009  . HYPERTENSION 04/30/2009  . ALLERGIC RHINITIS CAUSE UNSPECIFIED 04/30/2009  . GERD 04/30/2009  . OSTEOPENIA 04/30/2009  . LEG EDEMA 12/12/2009  . Dysuria 05/06/2010  . DEPRESSION, HX OF 04/11/2010    Past Surgical History  Procedure Laterality Date  . Breast enhancement surgery  1987  . Cholecystectomy  1974  . Abdominal hysterectomy  1986  . Breast surgery  1974    biopsy  . Cardiac catheterization N/A 11/02/2015    Procedure: Left Heart Cath and Coronary Angiography;  Surgeon: Belva Crome, MD;  Location: [facility];  Service: Cardiovascular;  Laterality: N/A;    Current Medications: Outpatient Prescriptions Prior to Visit  Medication Sig Dispense Refill  . aspirin 81 MG tablet Take 81 mg by mouth daily.      . cetirizine (ZYRTEC) 5 MG tablet Take 5 mg by mouth as needed for allergies.    Marland Kitchen ibuprofen (ADVIL,MOTRIN) 200 MG tablet Take  200-400 mg by mouth daily as needed for moderate pain.    Marland Kitchen losartan-hydrochlorothiazide (HYZAAR) 100-25 MG tablet Take 0.5 tablets by mouth daily.     . metoprolol succinate (TOPROL-XL) 50 MG 24 hr tablet Take 1 tablet (50 mg total) by mouth daily. Take with or immediately following a meal. 90 tablet 3  . omeprazole (PRILOSEC) 20 MG capsule Take 20 mg by mouth 2 (two) times daily as needed (for acid reflux).     . sertraline (ZOLOFT) 50 MG tablet Take 1 tablet (50 mg total) by mouth daily. 90 tablet 3   No facility-administered medications prior to visit.     Allergies:   Erythromycin base; Naproxen; Prednisone; and Scallops   Social History   Social History  . Marital Status: Married    Spouse Name: N/A  . Number of Children: N/A  . Years of Education: N/A   Social History Main Topics  . Smoking status: Never Smoker   . Smokeless tobacco: None  . Alcohol Use: None  . Drug Use: None  . Sexual Activity: Not Asked   Other Topics Concern  . None   Social History Narrative     Family History:  The patient's    family history includes Arthritis in her other; Cancer in her other; Emphysema in her mother; Heart disease (age of onset: 68) in her father; Heart disease (age of onset: 3)  in her mother; Hyperlipidemia in her other; Hypertension in her other.   ROS:   Please see the history of present illness.    Review of Systems  Constitution: Negative.  HENT: Negative.   Cardiovascular: Negative.   Respiratory: Negative.   Hematologic/Lymphatic: Negative.   Musculoskeletal: Negative.  Negative for joint pain.  Gastrointestinal: Negative.   Genitourinary: Negative.   Neurological: Negative.    All other systems reviewed and are negative.   PHYSICAL EXAM:   VS:  BP 140/80 mmHg  Pulse 64  Ht 5\' 6"  (1.676 m)  Wt 184 lb 6.4 oz (83.643 kg)  BMI 29.78 kg/m2  SpO2 95%   GEN: Well nourished, well developed, in no acute distress Neck: no JVD, carotid bruits, or masses Cardiac:   RRR; no murmurs, rubs, or gallops,no edema cath site without hematoma or hemorrhage, good radial and brachial pulses Respiratory:  clear to auscultation bilaterally, normal work of breathing GI: soft, nontender, nondistended, + BS MS: no deformity or atrophy Skin: warm and dry, no rash Neuro:  Alert and Oriented x 3, Strength and sensation are intact Psych: euthymic mood, full affect  Wt Readings from Last 3 Encounters:  11/19/15 184 lb 6.4 oz (83.643 kg)  11/07/15 185 lb 1.6 oz (83.961 kg)  11/02/15 182 lb (82.555 kg)      Studies/Labs Reviewed:   EKG:  EKG is  ordered today.  The ekg ordered today demonstratesNormal sinus rhythm with PVC  Recent Labs: 11/21/2014: TSH 2.04 11/01/2015: BUN 23; Creat 0.83; Hemoglobin 13.4; Platelets 279; Potassium 3.6; Sodium 137   Lipid Panel    Component Value Date/Time   CHOL 238* 11/21/2014 1240   TRIG 81.0 11/21/2014 1240   HDL 70.30 11/21/2014 1240   CHOLHDL 3 11/21/2014 1240   VLDL 16.2 11/21/2014 1240   LDLCALC 152* 11/21/2014 1240   LDLDIRECT 159.5 05/18/2013 0948    Additional studies/ records that were reviewed today include:    Conclusion      Normal coronary arteries  Normal left ventricular function  False positive myocardial perfusion study  RECOMMENDATIONS:     No further ischemic evaluation  False positive myocardial perfusion study     Indications      ASSESSMENT:    1. Follow up   2. Normal coronary arteries   3. Abnormal nuclear stress test   4. Essential hypertension   5. Palpitations      PLAN:  In order of problems listed above:  Patient have false positive nuclear stress test and underwent cardiac catheterization revealing normal coronary arteries. Her metoprolol was increased by primary care for worsening palpitations which have resolved. She did have trouble getting her heart rate up on a walk yesterday so may need to decrease metoprolol the future if she has trouble exercising on it.  Recommend decreasing her caffeine intake. Weight loss program with [employer] has been started. Follow-up with Dr. Lovena Le in 6 months.    Medication Adjustments/Labs and Tests Ordered: Current medicines are reviewed at length with the patient today.  Concerns regarding medicines are outlined above.  Medication changes, Labs and Tests ordered today are listed in the Patient Instructions below. Patient Instructions  Your physician wants you to follow-up in: 6 months with Dr.Taylor.  You will receive a reminder letter in the mail two months in advance. If you don't receive a letter, please call our office to schedule the follow-up appointment.      Signed, Ermalinda Barrios, PA-C  11/19/2015 1:40 PM  [facility] [address], [place], [place]  [address] Phone: [phone]; Fax: [phone]

## 2015-12-19 ENCOUNTER — Telehealth: Payer: Self-pay

## 2015-12-19 NOTE — Telephone Encounter (Signed)
Patient is on the list for [employer] 2017 and may be a good candidate for an AWV in 2017.  

## 2015-12-27 NOTE — Telephone Encounter (Signed)
Call to Ms. Rebecca Reid, Stated they both need to see Dr. Elease Hashimoto for medical reasons and he plan is for Ms. Rebecca Reid to schedule an apt with Dr. Elease Hashimoto and then discuss the AWV with him regarding him completing it or referring to me.

## 2015-12-27 NOTE — Telephone Encounter (Signed)
Call to Rebecca Reid and referred to wife for set up of AWV for him and his spouse. To call spouse back later today.

## 2016-01-01 DIAGNOSIS — H5711 Ocular pain, right eye: Secondary | ICD-10-CM | POA: Diagnosis not present

## 2016-01-01 DIAGNOSIS — H00011 Hordeolum externum right upper eyelid: Secondary | ICD-10-CM | POA: Diagnosis not present

## 2016-02-20 DIAGNOSIS — M5410 Radiculopathy, site unspecified: Secondary | ICD-10-CM | POA: Diagnosis not present

## 2016-02-29 DIAGNOSIS — M5410 Radiculopathy, site unspecified: Secondary | ICD-10-CM | POA: Diagnosis not present

## 2016-03-17 ENCOUNTER — Other Ambulatory Visit: Payer: Self-pay | Admitting: Family Medicine

## 2016-03-18 DIAGNOSIS — I10 Essential (primary) hypertension: Secondary | ICD-10-CM | POA: Diagnosis not present

## 2016-03-18 DIAGNOSIS — Z6828 Body mass index (BMI) 28.0-28.9, adult: Secondary | ICD-10-CM | POA: Diagnosis not present

## 2016-03-18 DIAGNOSIS — M5410 Radiculopathy, site unspecified: Secondary | ICD-10-CM | POA: Diagnosis not present

## 2016-03-19 ENCOUNTER — Other Ambulatory Visit (INDEPENDENT_AMBULATORY_CARE_PROVIDER_SITE_OTHER): Payer: Medicare HMO

## 2016-03-19 DIAGNOSIS — Z0000 Encounter for general adult medical examination without abnormal findings: Secondary | ICD-10-CM

## 2016-03-19 LAB — CBC WITH DIFFERENTIAL/PLATELET
BASOS PCT: 0.2 % (ref 0.0–3.0)
Basophils Absolute: 0 10*3/uL (ref 0.0–0.1)
EOS PCT: 0.1 % (ref 0.0–5.0)
Eosinophils Absolute: 0 10*3/uL (ref 0.0–0.7)
HEMATOCRIT: 37.3 % (ref 36.0–46.0)
Hemoglobin: 12.8 g/dL (ref 12.0–15.0)
LYMPHS PCT: 14.6 % (ref 12.0–46.0)
Lymphs Abs: 1.4 10*3/uL (ref 0.7–4.0)
MCHC: 34.2 g/dL (ref 30.0–36.0)
MCV: 83.2 fl (ref 78.0–100.0)
MONOS PCT: 6.9 % (ref 3.0–12.0)
Monocytes Absolute: 0.7 10*3/uL (ref 0.1–1.0)
NEUTROS ABS: 7.7 10*3/uL (ref 1.4–7.7)
Neutrophils Relative %: 78.2 % — ABNORMAL HIGH (ref 43.0–77.0)
PLATELETS: 269 10*3/uL (ref 150.0–400.0)
RBC: 4.49 Mil/uL (ref 3.87–5.11)
RDW: 15.4 % (ref 11.5–15.5)
WBC: 9.9 10*3/uL (ref 4.0–10.5)

## 2016-03-19 LAB — LIPID PANEL
Cholesterol: 200 mg/dL (ref 0–200)
HDL: 71.5 mg/dL
LDL Cholesterol: 118 mg/dL — ABNORMAL HIGH (ref 0–99)
NonHDL: 128.71
Total CHOL/HDL Ratio: 3
Triglycerides: 53 mg/dL (ref 0.0–149.0)
VLDL: 10.6 mg/dL (ref 0.0–40.0)

## 2016-03-19 LAB — BASIC METABOLIC PANEL
BUN: 16 mg/dL (ref 6–23)
CO2: 28 mEq/L (ref 19–32)
CREATININE: 0.78 mg/dL (ref 0.40–1.20)
Calcium: 9.4 mg/dL (ref 8.4–10.5)
Chloride: 105 mEq/L (ref 96–112)
GFR: 77.81 mL/min (ref >60.00)
GLUCOSE: 114 mg/dL — AB (ref 70–99)
Potassium: 3.2 mEq/L — ABNORMAL LOW (ref 3.5–5.1)
Sodium: 142 mEq/L (ref 135–145)

## 2016-03-19 LAB — HEPATIC FUNCTION PANEL
ALT: 12 U/L (ref 0–35)
AST: 16 U/L (ref 0–37)
Albumin: 4.1 g/dL (ref 3.5–5.2)
Alkaline Phosphatase: 71 U/L (ref 39–117)
Bilirubin, Direct: 0 mg/dL (ref 0.0–0.3)
Total Bilirubin: 0.6 mg/dL (ref 0.2–1.2)
Total Protein: 6.8 g/dL (ref 6.0–8.3)

## 2016-03-19 LAB — TSH: TSH: 0.88 u[IU]/mL (ref 0.35–4.50)

## 2016-03-25 ENCOUNTER — Encounter: Payer: Self-pay | Admitting: Family Medicine

## 2016-03-25 ENCOUNTER — Ambulatory Visit (INDEPENDENT_AMBULATORY_CARE_PROVIDER_SITE_OTHER): Payer: Medicare HMO | Admitting: Family Medicine

## 2016-03-25 VITALS — BP 110/90 | HR 96 | Temp 97.7°F | Ht 65.0 in | Wt 171.0 lb

## 2016-03-25 DIAGNOSIS — E876 Hypokalemia: Secondary | ICD-10-CM | POA: Diagnosis not present

## 2016-03-25 DIAGNOSIS — M791 Myalgia: Secondary | ICD-10-CM

## 2016-03-25 DIAGNOSIS — Z23 Encounter for immunization: Secondary | ICD-10-CM

## 2016-03-25 DIAGNOSIS — Z0000 Encounter for general adult medical examination without abnormal findings: Secondary | ICD-10-CM

## 2016-03-25 DIAGNOSIS — IMO0001 Reserved for inherently not codable concepts without codable children: Secondary | ICD-10-CM

## 2016-03-25 DIAGNOSIS — M609 Myositis, unspecified: Secondary | ICD-10-CM | POA: Diagnosis not present

## 2016-03-25 DIAGNOSIS — Z299 Encounter for prophylactic measures, unspecified: Secondary | ICD-10-CM

## 2016-03-25 NOTE — Progress Notes (Signed)
Pre visit review using our clinic review tool, if applicable. No additional management support is needed unless otherwise documented below in the visit note. 

## 2016-03-25 NOTE — Progress Notes (Signed)
Subjective:     Patient ID: Rebecca Reid, female   DOB: 1946/04/18, 70 y.o.   MRN: [id number]  HPI Patient seen for physical exam. She and her husband have made some significant changes and she and feels great overall. She is very pleased with the weight loss. She's had previous hysterectomy for benign disease and does not get Pap smears. She has not had recent colonoscopy and is not sure she wishes to pursue that. She is overdue for mammogram. Still needs flu vaccine. She had Pneumovax at age 50 and no history of Prevnar 43.  She has had some recent nonspecific myalgias involving thighs. No muscle cramps. Requesting vitamin D level. No recent falls.  Past Medical History:  Diagnosis Date  . ALLERGIC RHINITIS CAUSE UNSPECIFIED 04/30/2009  . DEPRESSION, HX OF 04/11/2010  . Dysuria 05/06/2010  . GERD 04/30/2009  . HYPERLIPIDEMIA 04/30/2009  . HYPERTENSION 04/30/2009  . LEG EDEMA 12/12/2009  . OSTEOPENIA 04/30/2009   Past Surgical History:  Procedure Laterality Date  . ABDOMINAL HYSTERECTOMY  1986  . BREAST ENHANCEMENT SURGERY  1987  . BREAST SURGERY  1974   biopsy  . CARDIAC CATHETERIZATION N/A 11/02/2015   Procedure: Left Heart Cath and Coronary Angiography;  Surgeon: Belva Crome, MD;  Location: [facility];  Service: Cardiovascular;  Laterality: N/A;  . CHOLECYSTECTOMY  1974    reports that she has never smoked. She does not have any smokeless tobacco history on file. Her alcohol and drug histories are not on file. family history includes Arthritis in her other; Cancer in her other; Emphysema in her mother; Heart disease (age of onset: 24) in her father; Heart disease (age of onset: 44) in her mother; Hyperlipidemia in her other; Hypertension in her other. Allergies  Allergen Reactions  . Erythromycin Base Other (See Comments)    yeast infection, GI upset  . Naproxen Other (See Comments)    GI upset  . Prednisone Hives    REACTION: hives, anxiety, insomnia  . Scallops  [Shellfish Allergy] Nausea And Vomiting     Review of Systems  Constitutional: Negative for activity change, appetite change, fatigue, fever and unexpected weight change.  HENT: Negative for ear pain, hearing loss, sore throat and trouble swallowing.   Eyes: Negative for visual disturbance.  Respiratory: Negative for cough and shortness of breath.   Cardiovascular: Negative for chest pain and palpitations.  Gastrointestinal: Negative for abdominal pain, blood in stool, constipation and diarrhea.  Endocrine: Negative for polydipsia and polyuria.  Genitourinary: Negative for dysuria and hematuria.  Musculoskeletal: Negative for arthralgias, back pain and myalgias.  Skin: Negative for rash.  Neurological: Negative for dizziness, syncope and headaches.  Hematological: Negative for adenopathy.  Psychiatric/Behavioral: Negative for confusion and dysphoric mood.       Objective:   Physical Exam  Constitutional: She is oriented to person, place, and time. She appears well-developed and well-nourished.  HENT:  Head: Normocephalic and atraumatic.  Eyes: EOM are normal. Pupils are equal, round, and reactive to light.  Neck: Normal range of motion. Neck supple. No thyromegaly present.  Cardiovascular: Normal rate, regular rhythm and normal heart sounds.   No murmur heard. Pulmonary/Chest: Breath sounds normal. No respiratory distress. She has no wheezes. She has no rales.  Abdominal: Soft. Bowel sounds are normal. She exhibits no distension and no mass. There is no tenderness. There is no rebound and no guarding.  Musculoskeletal: Normal range of motion. She exhibits no edema.  Lymphadenopathy:    She  has no cervical adenopathy.  Neurological: She is alert and oriented to person, place, and time. She displays normal reflexes. No cranial nerve deficit.  Skin: No rash noted.  Psychiatric: She has a normal mood and affect. Her behavior is normal. Judgment and thought content normal.        Assessment:     Physical exam. Patient congratulated with weight loss. She needs flu vaccine and Prevnar 13. Also needs colon cancer screening. Patient did have recent low potassium probably related to thiazide therapy for hypertension.    Plan:     -Increase potassium rich foods and recheck basic metabolic panel in 1 week -Check vitamin D levels. Patient's had some significant muscle fatigue and myalgias -Continue weight loss efforts -Information given regarding DNA-based stool testing and she will check on insurance coverage. She is not sure she wishes to pursue colonoscopy at this time -Set up repeat mammography -Flu vaccine given  [name] MD [facility] at [facility]

## 2016-03-25 NOTE — Patient Instructions (Addendum)

## 2016-03-26 ENCOUNTER — Telehealth: Payer: Self-pay | Admitting: *Deleted

## 2016-03-26 ENCOUNTER — Encounter: Payer: Self-pay | Admitting: Family Medicine

## 2016-03-26 ENCOUNTER — Other Ambulatory Visit: Payer: Self-pay | Admitting: Family Medicine

## 2016-03-26 DIAGNOSIS — Z1231 Encounter for screening mammogram for malignant neoplasm of breast: Secondary | ICD-10-CM

## 2016-03-26 NOTE — Telephone Encounter (Signed)
Patient seen by Dr. Elease Hashimoto 03-25-16 and given a Prevnar 13 injection. According to her records she had this immunization on 03-31-12 but according to Dr. Elease Hashimoto this was before we were giving these. We believe the one given on 03-31-12 was the pneumovax 23.

## 2016-03-28 ENCOUNTER — Other Ambulatory Visit (INDEPENDENT_AMBULATORY_CARE_PROVIDER_SITE_OTHER): Payer: Medicare HMO

## 2016-03-28 DIAGNOSIS — I10 Essential (primary) hypertension: Secondary | ICD-10-CM | POA: Diagnosis not present

## 2016-03-28 DIAGNOSIS — E559 Vitamin D deficiency, unspecified: Secondary | ICD-10-CM

## 2016-03-28 LAB — BASIC METABOLIC PANEL
BUN: 24 mg/dL — AB (ref 6–23)
CHLORIDE: 103 meq/L (ref 96–112)
CO2: 29 meq/L (ref 19–32)
Calcium: 9.3 mg/dL (ref 8.4–10.5)
Creatinine, Ser: 0.74 mg/dL (ref 0.40–1.20)
GFR: 82.67 mL/min (ref >60.00)
Glucose, Bld: 93 mg/dL (ref 70–99)
POTASSIUM: 4.1 meq/L (ref 3.5–5.1)
Sodium: 137 mEq/L (ref 135–145)

## 2016-03-28 LAB — VITAMIN D 25 HYDROXY (VIT D DEFICIENCY, FRACTURES): VITD: 26.04 ng/mL — ABNORMAL LOW (ref 30.00–100.00)

## 2016-03-30 ENCOUNTER — Encounter: Payer: Self-pay | Admitting: Family Medicine

## 2016-03-31 ENCOUNTER — Other Ambulatory Visit: Payer: Self-pay

## 2016-04-04 ENCOUNTER — Ambulatory Visit
Admission: RE | Admit: 2016-04-04 | Discharge: 2016-04-04 | Disposition: A | Discharge status: Home / Self Care | Payer: Medicare HMO | Source: Ambulatory Visit | Attending: Family Medicine | Admitting: Family Medicine

## 2016-04-04 DIAGNOSIS — Z1231 Encounter for screening mammogram for malignant neoplasm of breast: Secondary | ICD-10-CM

## 2016-04-07 DIAGNOSIS — Z1211 Encounter for screening for malignant neoplasm of colon: Secondary | ICD-10-CM | POA: Diagnosis not present

## 2016-04-07 DIAGNOSIS — Z1212 Encounter for screening for malignant neoplasm of rectum: Secondary | ICD-10-CM | POA: Diagnosis not present

## 2016-04-07 LAB — COLOGUARD: COLOGUARD: NEGATIVE

## 2016-04-16 ENCOUNTER — Encounter: Payer: Self-pay | Admitting: Family Medicine

## 2016-04-21 DIAGNOSIS — H259 Unspecified age-related cataract: Secondary | ICD-10-CM | POA: Diagnosis not present

## 2016-04-21 DIAGNOSIS — H04123 Dry eye syndrome of bilateral lacrimal glands: Secondary | ICD-10-CM | POA: Diagnosis not present

## 2016-05-23 DIAGNOSIS — H01006 Unspecified blepharitis left eye, unspecified eyelid: Secondary | ICD-10-CM | POA: Diagnosis not present

## 2016-05-23 DIAGNOSIS — H0011 Chalazion right upper eyelid: Secondary | ICD-10-CM | POA: Diagnosis not present

## 2016-05-23 DIAGNOSIS — H01003 Unspecified blepharitis right eye, unspecified eyelid: Secondary | ICD-10-CM | POA: Diagnosis not present

## 2016-05-23 DIAGNOSIS — H2513 Age-related nuclear cataract, bilateral: Secondary | ICD-10-CM | POA: Diagnosis not present

## 2016-06-04 DIAGNOSIS — H01003 Unspecified blepharitis right eye, unspecified eyelid: Secondary | ICD-10-CM | POA: Diagnosis not present

## 2016-06-04 DIAGNOSIS — H0011 Chalazion right upper eyelid: Secondary | ICD-10-CM | POA: Diagnosis not present

## 2016-06-04 DIAGNOSIS — H01006 Unspecified blepharitis left eye, unspecified eyelid: Secondary | ICD-10-CM | POA: Diagnosis not present

## 2016-06-11 ENCOUNTER — Other Ambulatory Visit: Payer: Self-pay | Admitting: Family Medicine

## 2016-07-07 ENCOUNTER — Encounter: Payer: Self-pay | Admitting: Family Medicine

## 2016-07-08 ENCOUNTER — Other Ambulatory Visit: Payer: Self-pay

## 2016-07-08 DIAGNOSIS — H18413 Arcus senilis, bilateral: Secondary | ICD-10-CM | POA: Diagnosis not present

## 2016-07-08 DIAGNOSIS — H2513 Age-related nuclear cataract, bilateral: Secondary | ICD-10-CM | POA: Diagnosis not present

## 2016-07-08 DIAGNOSIS — H02839 Dermatochalasis of unspecified eye, unspecified eyelid: Secondary | ICD-10-CM | POA: Diagnosis not present

## 2016-07-08 DIAGNOSIS — H2512 Age-related nuclear cataract, left eye: Secondary | ICD-10-CM | POA: Diagnosis not present

## 2016-07-08 DIAGNOSIS — H25013 Cortical age-related cataract, bilateral: Secondary | ICD-10-CM | POA: Diagnosis not present

## 2016-08-18 DIAGNOSIS — H2512 Age-related nuclear cataract, left eye: Secondary | ICD-10-CM | POA: Diagnosis not present

## 2016-08-19 DIAGNOSIS — H2511 Age-related nuclear cataract, right eye: Secondary | ICD-10-CM | POA: Diagnosis not present

## 2016-08-20 ENCOUNTER — Encounter: Payer: Self-pay | Admitting: Family Medicine

## 2016-08-20 ENCOUNTER — Ambulatory Visit (INDEPENDENT_AMBULATORY_CARE_PROVIDER_SITE_OTHER): Payer: Medicare HMO | Admitting: Family Medicine

## 2016-08-20 DIAGNOSIS — J209 Acute bronchitis, unspecified: Secondary | ICD-10-CM

## 2016-08-20 MED ORDER — HYDROCOD POLST-CPM POLST ER 10-8 MG/5ML PO SUER: 5.0000 mL | Freq: Two times a day (BID) | ORAL | 0 refills | Status: DC | PRN

## 2016-08-20 NOTE — Progress Notes (Signed)
Subjective:     Patient ID: Rebecca Reid, female   DOB: 12-01-1945, 71 y.o.   MRN: [id number]  HPI Patient seen with three-week history of persistent dry cough. She's tried Robitussin-DM and Mucinex without improvement. Her cough is especially bothersome at night. Started with viral URI type symptoms with mild sore throat and nasal congestion. Those symptoms have improved. Denies any dyspnea. No wheezing. No GERD. No postnasal drip symptoms. No fevers or chills. She has been losing some weight through [employer] and feels good overall. Cough is severe at times at night  Past Medical History:  Diagnosis Date  . ALLERGIC RHINITIS CAUSE UNSPECIFIED 04/30/2009  . DEPRESSION, HX OF 04/11/2010  . Dysuria 05/06/2010  . GERD 04/30/2009  . HYPERLIPIDEMIA 04/30/2009  . HYPERTENSION 04/30/2009  . LEG EDEMA 12/12/2009  . OSTEOPENIA 04/30/2009   Past Surgical History:  Procedure Laterality Date  . ABDOMINAL HYSTERECTOMY  1986  . BREAST ENHANCEMENT SURGERY  1987  . BREAST SURGERY  1974   biopsy  . CARDIAC CATHETERIZATION N/A 11/02/2015   Procedure: Left Heart Cath and Coronary Angiography;  Surgeon: Belva Crome, MD;  Location: [facility];  Service: Cardiovascular;  Laterality: N/A;  . CHOLECYSTECTOMY  1974    reports that she has never smoked. She has never used smokeless tobacco. Her alcohol and drug histories are not on file. family history includes Arthritis in her other; Cancer in her other; Emphysema in her mother; Heart disease (age of onset: 56) in her father; Heart disease (age of onset: 73) in her mother; Hyperlipidemia in her other; Hypertension in her other. Allergies  Allergen Reactions  . Erythromycin Base Other (See Comments)    yeast infection, GI upset  . Naproxen Other (See Comments)    GI upset  . Prednisone Hives    REACTION: hives, anxiety, insomnia  . Scallops [Shellfish Allergy] Nausea And Vomiting     Review of Systems  Constitutional: Negative for  chills and fever.  Respiratory: Positive for cough. Negative for shortness of breath and wheezing.   Cardiovascular: Negative for chest pain.       Objective:   Physical Exam  Constitutional: She appears well-developed and well-nourished.  HENT:  Right Ear: External ear normal.  Left Ear: External ear normal.  Mouth/Throat: Oropharynx is clear and moist.  Neck: Neck supple.  Cardiovascular: Normal rate and regular rhythm.   Pulmonary/Chest: Effort normal and breath sounds normal. No respiratory distress. She has no wheezes. She has no rales.  Lymphadenopathy:    She has no cervical adenopathy.       Assessment:     Dry cough. Suspect viral bronchitis. Nonfocal exam    Plan:     -Tussionex 120 mL's 1 teaspoon daily at bedtime for severe cough -Follow-up for any fever or change of symptoms and also recommend follow-up in 2 weeks if cough not resolving  [name] MD [facility] at [facility]

## 2016-08-20 NOTE — Patient Instructions (Signed)
Follow up for any fever or increased shortness of breath Touch base if cough not better in 2-3 weeks.

## 2016-08-20 NOTE — Progress Notes (Signed)
Pre visit review using our clinic review tool, if applicable. No additional management support is needed unless otherwise documented below in the visit note. 

## 2016-08-25 ENCOUNTER — Telehealth: Payer: Self-pay | Admitting: Family Medicine

## 2016-08-25 NOTE — Telephone Encounter (Signed)
Pt was seen on 08-20-16 and now having dizziness. Please advise  [facility] in [place]

## 2016-08-26 ENCOUNTER — Encounter: Payer: Self-pay | Admitting: Family Medicine

## 2016-08-26 MED ORDER — AMOXICILLIN 875 MG PO TABS: 875.0000 mg | ORAL_TABLET | Freq: Two times a day (BID) | ORAL | 0 refills | Status: DC

## 2016-08-26 NOTE — Telephone Encounter (Signed)
Pt is c/o green, yellow drainage from nose with congestion, Head fullness/dizzines from that, maxillary sinus pain, ear fullness, sore throat with post nasal drip, and dry cough. Denies any fevers. Please advise if okay to sent in something for her. Thanks.

## 2016-08-26 NOTE — Telephone Encounter (Signed)
Pt was seen on 08/20/2016  Please advise

## 2016-08-26 NOTE — Telephone Encounter (Signed)
Need more info- vertigo?  Light headed?  Severity of symptoms, etc.

## 2016-08-26 NOTE — Telephone Encounter (Signed)
Medication sent in for patient and she is aware.

## 2016-08-26 NOTE — Telephone Encounter (Signed)
Amoxicillin 875 mg pobid for 10 days 

## 2016-09-29 DIAGNOSIS — H2511 Age-related nuclear cataract, right eye: Secondary | ICD-10-CM | POA: Diagnosis not present

## 2016-11-05 ENCOUNTER — Other Ambulatory Visit: Payer: Self-pay | Admitting: Family Medicine

## 2016-12-04 ENCOUNTER — Other Ambulatory Visit: Payer: Self-pay | Admitting: Family Medicine

## 2016-12-12 ENCOUNTER — Encounter: Payer: Self-pay | Admitting: Family Medicine

## 2016-12-12 ENCOUNTER — Ambulatory Visit (INDEPENDENT_AMBULATORY_CARE_PROVIDER_SITE_OTHER)
Admission: RE | Admit: 2016-12-12 | Discharge: 2016-12-12 | Disposition: A | Discharge status: Home / Self Care | Payer: Medicare HMO | Source: Ambulatory Visit | Attending: Family Medicine | Admitting: Family Medicine

## 2016-12-12 ENCOUNTER — Ambulatory Visit (INDEPENDENT_AMBULATORY_CARE_PROVIDER_SITE_OTHER): Payer: Medicare HMO | Admitting: Family Medicine

## 2016-12-12 VITALS — BP 124/88 | HR 66 | Temp 98.0°F | Wt 171.8 lb

## 2016-12-12 DIAGNOSIS — S8992XA Unspecified injury of left lower leg, initial encounter: Secondary | ICD-10-CM | POA: Diagnosis not present

## 2016-12-12 DIAGNOSIS — M79662 Pain in left lower leg: Secondary | ICD-10-CM | POA: Diagnosis not present

## 2016-12-12 DIAGNOSIS — M79605 Pain in left leg: Secondary | ICD-10-CM

## 2016-12-12 NOTE — Progress Notes (Signed)
Subjective:     Patient ID: Rebecca Reid, female   DOB: 10-11-1945, 71 y.o.   MRN: [id number]  HPI Patient seen with left leg pain. She states about 2 months [place] and twisted her left ankle at night and fell on her left side. She had some soreness for several weeks but that was slowly improving. Minimal about 2 weeks ago she was in her garage and tripped over an object and landed basically on her left knee. She had some swelling proximal tibia area. She denied any pain over her patella but pain is inferior to that region proximal tibia. She has some pain with ambulation but also sometimes at rest. Pain is moderate. No calf pain. No knee effusion. Minimal pain with flexion and extension of left knee  Past Medical History:  Diagnosis Date  . ALLERGIC RHINITIS CAUSE UNSPECIFIED 04/30/2009  . DEPRESSION, HX OF 04/11/2010  . Dysuria 05/06/2010  . GERD 04/30/2009  . HYPERLIPIDEMIA 04/30/2009  . HYPERTENSION 04/30/2009  . LEG EDEMA 12/12/2009  . OSTEOPENIA 04/30/2009   Past Surgical History:  Procedure Laterality Date  . ABDOMINAL HYSTERECTOMY  1986  . BREAST ENHANCEMENT SURGERY  1987  . BREAST SURGERY  1974   biopsy  . CARDIAC CATHETERIZATION N/A 11/02/2015   Procedure: Left Heart Cath and Coronary Angiography;  Surgeon: Belva Crome, MD;  Location: [facility];  Service: Cardiovascular;  Laterality: N/A;  . CHOLECYSTECTOMY  1974    reports that she has never smoked. She has never used smokeless tobacco. Her alcohol and drug histories are not on file. family history includes Arthritis in her other; Cancer in her other; Emphysema in her mother; Heart disease (age of onset: 33) in her father; Heart disease (age of onset: 71) in her mother; Hyperlipidemia in her other; Hypertension in her other. Allergies  Allergen Reactions  . Erythromycin Base Other (See Comments)    yeast infection, GI upset  . Naproxen Other (See Comments)    GI  upset  . Prednisone Hives    REACTION: hives, anxiety, insomnia  . Scallops [Shellfish Allergy] Nausea And Vomiting     Review of Systems  Constitutional: Negative for chills and fever.       Objective:   Physical Exam  Constitutional: She appears well-developed and well-nourished.  Cardiovascular: Normal rate and regular rhythm.   Pulmonary/Chest: Effort normal and breath sounds normal. No respiratory distress. She has no wheezes. She has no rales.  Musculoskeletal:  Patient has some fading ecchymosis involving her left proximal leg. She has no tenderness over the patellar region. She does have some tenderness in palpating the proximal tibia. Full range of motion left knee. No knee effusion. No calf tenderness. No joint line tenderness       Assessment:     Left proximal leg pain following recent fall.  She does not have any tenderness to suggest patellar injury    Plan:     -Obtain left tib/fib x-rays to rule out fracture. Hopefully this represents just a contusion  [name] MD [facility] at [facility]

## 2017-01-28 ENCOUNTER — Ambulatory Visit (INDEPENDENT_AMBULATORY_CARE_PROVIDER_SITE_OTHER): Payer: Medicare HMO | Admitting: Family Medicine

## 2017-01-28 ENCOUNTER — Encounter: Payer: Self-pay | Admitting: Family Medicine

## 2017-01-28 VITALS — BP 110/80 | HR 64 | Temp 98.0°F | Wt 172.3 lb

## 2017-01-28 DIAGNOSIS — M7062 Trochanteric bursitis, left hip: Secondary | ICD-10-CM | POA: Diagnosis not present

## 2017-01-28 MED ORDER — METHYLPREDNISOLONE ACETATE 80 MG/ML IJ SUSP
80.0000 mg | Freq: Once | INTRAMUSCULAR | Status: AC
  Administered 2017-01-28: 80 mg via INTRAMUSCULAR

## 2017-01-28 NOTE — Progress Notes (Addendum)
Subjective:     Patient ID: Rebecca Reid, female   DOB: 1945/07/24, 71 y.o.   MRN: [id number]  HPI Patient seen with some persistent left lower extremity pain following a couple falls back in the spring. Refer to previous note for some details.  "Patient seen with left leg pain. She states about 2 months [place] and twisted her left ankle at night and fell on her left side. She had some soreness for several weeks but that was slowly improving. Minimal about 2 weeks ago she was in her garage and tripped over an object and landed basically on her left knee. She had some swelling proximal tibia area. She denied any pain over her patella but pain is inferior to that region proximal tibia. She has some pain with ambulation but also sometimes at rest. Pain is moderate. No calf pain. No knee effusion. Minimal pain with flexion and extension of left knee"  She has pain in multiple areas including mostly left lateral hip but also some pain around her right lateral proximal calf region and lateral knee region. She's had previous back difficulties but denies any radiculitis symptoms. Denies any low back pain. No weakness. No pain with ambulation. Pain seems to be worse at night. She has pain when rolling onto her left hip region. She's tried some Advil without much relief. Has not noted any swelling around the knee region - no effusion.  Past Medical History:  Diagnosis Date  . ALLERGIC RHINITIS CAUSE UNSPECIFIED 04/30/2009  . DEPRESSION, HX OF 04/11/2010  . Dysuria 05/06/2010  . GERD 04/30/2009  . HYPERLIPIDEMIA 04/30/2009  . HYPERTENSION 04/30/2009  . LEG EDEMA 12/12/2009  . OSTEOPENIA 04/30/2009   Past Surgical History:  Procedure Laterality Date  . ABDOMINAL HYSTERECTOMY  1986  . BREAST ENHANCEMENT SURGERY  1987  . BREAST SURGERY  1974   biopsy  . CARDIAC CATHETERIZATION N/A 11/02/2015   Procedure: Left Heart Cath and Coronary Angiography;  Surgeon:  Belva Crome, MD;  Location: [facility];  Service: Cardiovascular;  Laterality: N/A;  . CHOLECYSTECTOMY  1974    reports that she has never smoked. She has never used smokeless tobacco. Her alcohol and drug histories are not on file. family history includes Arthritis in her other; Cancer in her other; Emphysema in her mother; Heart disease (age of onset: 56) in her father; Heart disease (age of onset: 40) in her mother; Hyperlipidemia in her other; Hypertension in her other. Allergies  Allergen Reactions  . Erythromycin Base Other (See Comments)    yeast infection, GI upset  . Naproxen Other (See Comments)    GI upset  . Prednisone Hives    REACTION: hives, anxiety, insomnia  . Scallops [Shellfish Allergy] Nausea And Vomiting     .  Review of Systems  Constitutional: Negative for chills and fever.  Musculoskeletal: Negative for back pain.  Neurological: Negative for weakness and numbness.  Hematological: Negative for adenopathy.       Objective:   Physical Exam  Constitutional: She appears well-developed and well-nourished.  Cardiovascular: Normal rate and regular rhythm.   Musculoskeletal:  Left lower extremity no edema. No calf tenderness. Knee reveals no effusion. Excellent range of motion and knee and hip. She has some definite tenderness of the left greater trochanteric bursa region.  No localized knee tenderness.       Assessment:     Poorly localized left lower extremity pain following  recent falls. Recent tib-fib x-rays were unremarkable. She has some definite tenderness over left lateral hip over her bursa region that not sure this accounts for all of her pain.    Plan:     -We recommended trial of steroid injection into the bursa region. We discussed risks including bruising, bleeding, infection and patient consents Using Betadine cleaned area over left greater trochanteric bursa. Using 25-gauge one and 1/2 inch needle injected 1 mL of Depo-Medrol and 2  mL of plain Xylocaine into the bursa region -Try some icing for the next couple of days -Touch base by early next week if not improving  [name] MD [facility] at [facility]  Addendum added 02-08-17:  Depomedrol 80 mg given for bursa injection above.  [name] MD [facility] at [facility]

## 2017-01-28 NOTE — Patient Instructions (Signed)
Hip Bursitis Hip bursitis is inflammation of a fluid-filled sac (bursa) in the hip joint. The bursa protects the bones in the hip joint from rubbing against each other. Hip bursitis can cause mild to moderate pain, and symptoms often come and go over time. What are the causes? This condition may be caused by:  Injury to the hip.  Overuse of the muscles that surround the hip joint.  Arthritis or gout.  Diabetes.  Thyroid disease.  Cold weather.  Infection.  In some cases, the cause may not be known. What are the signs or symptoms? Symptoms of this condition may include:  Mild or moderate pain in the hip area. Pain may get worse with movement.  Tenderness and swelling of the hip, especially on the outer side of the hip.  Symptoms may come and go. If the bursa becomes infected, you may have the following symptoms:  Fever.  Red skin and a feeling of warmth in the hip area.  How is this diagnosed? This condition may be diagnosed based on:  A physical exam.  Your medical history.  X-rays.  Removal of fluid from your inflamed bursa for testing (biopsy).  You may be sent to a health care provider who specializes in bone diseases (orthopedist) or a provider who specializes in joint inflammation (rheumatologist). How is this treated? This condition is treated by resting, raising (elevating), and applying pressure(compression) to the injured area. In some cases, this may be enough to make your symptoms go away. Treatment may also include:  Crutches.  Antibiotic medicine.  Draining fluid out of the bursa to help relieve swelling.  Injecting medicine that helps to reduce inflammation (cortisone).  Follow these instructions at home: Medicines  Take over-the-counter and prescription medicines only as told by your health care provider.  Do not drive or operate heavy machinery while taking prescription pain medicine, or as told by your health care provider.  If you were  prescribed an antibiotic, take it as told by your health care provider. Do not stop taking the antibiotic even if you start to feel better. Activity  Return to your normal activities as told by your health care provider. Ask your health care provider what activities are safe for you.  Rest and protect your hip as much as possible until your pain and swelling get better. General instructions  Wear compression wraps only as told by your health care provider.  Elevate your hip above the level of your heart as much as you can without pain. To do this, try putting a pillow under your hips while you lie down.  Do not use your hip to support your body weight until your health care provider says that you can. Use crutches as told by your health care provider.  Gently massage and stretch your injured area as often as is comfortable.  Keep all follow-up visits as told by your health care provider. This is important. How is this prevented?  Exercise regularly, as told by your health care provider.  Warm up and stretch before being active.  Cool down and stretch after being active.  If an activity irritates your hip or causes pain, avoid the activity as much as possible.  Avoid sitting down for long periods at a time. Contact a health care provider if:  You have a fever.  You develop new symptoms.  You have difficulty walking or doing everyday activities.  You have pain that gets worse or does not get better with medicine.  You   develop red skin or a feeling of warmth in your hip area. Get help right away if:  You cannot move your hip.  You have severe pain. This information is not intended to replace advice given to you by your health care provider. Make sure you discuss any questions you have with your health care provider. Document Released: 12/27/2001 Document Revised: 12/13/2015 Document Reviewed: 02/06/2015 Elsevier Interactive Patient Education  2018 [employer]  

## 2017-02-02 ENCOUNTER — Telehealth: Payer: Self-pay | Admitting: Family Medicine

## 2017-02-02 NOTE — Telephone Encounter (Signed)
The patient called to let Dr. Elease Hashimoto know that she is feeling a little bit better since she last saw him but she had a few questions that she wanted to ask him. Please advise patient

## 2017-02-03 NOTE — Telephone Encounter (Signed)
Spoke with pt and she states that the injection did help some. She reports continued swelling and pain in Left hip area and still can't sleep on that side. She would like to know how long the effects of the injection should last and what needs to be done next since she is still in pain.   Dr. Elease Hashimoto - Please advise. Thanks!

## 2017-02-03 NOTE — Telephone Encounter (Signed)
Make sure she has tried some icing 15-20 minutes couple times daily and let's give this another week

## 2017-02-04 NOTE — Telephone Encounter (Signed)
Notified patient of MD instructions; understanding voiced. Patient states she is feeling somewhat better today; patient to return call if symptoms worsen or don't improve over the next week.

## 2017-02-14 ENCOUNTER — Other Ambulatory Visit: Payer: Self-pay | Admitting: Family Medicine

## 2017-03-20 ENCOUNTER — Encounter: Payer: Self-pay | Admitting: Family Medicine

## 2017-03-24 ENCOUNTER — Ambulatory Visit (INDEPENDENT_AMBULATORY_CARE_PROVIDER_SITE_OTHER): Payer: Medicare HMO | Admitting: *Deleted

## 2017-03-24 DIAGNOSIS — Z23 Encounter for immunization: Secondary | ICD-10-CM

## 2017-04-09 ENCOUNTER — Encounter: Payer: Self-pay | Admitting: Family Medicine

## 2017-04-20 DIAGNOSIS — 419620001 Death: Secondary | SNOMED CT | POA: Diagnosis not present

## 2017-04-20 DEATH — deceased

## 2017-06-03 ENCOUNTER — Other Ambulatory Visit: Payer: Self-pay | Admitting: Family Medicine

## 2017-07-07 ENCOUNTER — Telehealth: Payer: Self-pay | Admitting: Family Medicine

## 2017-07-07 NOTE — Telephone Encounter (Signed)
Left message to call back and discuss symptoms and possible need for office visit.

## 2017-07-07 NOTE — Telephone Encounter (Signed)
Copied from [place] [id number]. Topic: Inquiry >> Jul 07, 2017 10:39 AM [name] wrote: Pt is on week 2 1/2 of cold/sinus issues - she has tried mucinex and gave it time.  It isn't getting better.  Pt is asking if Dr. Elease Hashimoto can call her in a [employer] - pt is going out of town   Kristopher Oppenheim at [facility] - [phone]

## 2017-07-21 HISTORY — PX: OTHER SURGICAL HISTORY: SHX169

## 2017-07-28 DIAGNOSIS — G5762 Lesion of plantar nerve, left lower limb: Secondary | ICD-10-CM | POA: Diagnosis not present

## 2017-07-30 NOTE — Progress Notes (Signed)
Chief Complaint  Patient presents with  . Fall    07/22/17    HPI: Rebecca Reid 72 y.o.  sda  PCP NA come in for   acatue problem   With her husband    .Marland Kitchen Had a fall  07/29/17  2 days ago   [place] the [place]  [place] [employer]    and around [place] and   Was with  GF .  [place] of [place]    Next think she remembers was that she fell on face and pusre was cross body and has sternal right breast bruising as well as right forehead hematoma and  Cheekbone pain   glasses were on .    No nose bleeds  [place]   People helped  Ems saw her  And bp was very high  And declined to go to ED.     Had large  Bump right  Forehead   And felt needed to be checked  Breast is back and blue .  Had cross body purse  And sunglasses   Using ice with help and no inc head ache vision  hearing neuro sx .  Is sore on face including  Mid forehead   Tends to bruise easily  ( her whole life)    .   Take 81 baby asa.  Every day .    No loc  Just. down    Hx of fall and  In past   [place]  With walking trip curb.    3-4 fallen and not know why fallen.      jaw sore.   nochange in hearing.   Tends to fall a good bit in past years  Garage   No prodrome  uncertain cause  although says balance isnt that great . Was better when doing yoga    ROS: See pertinent positives and negatives per HPI. No ? Loc cp sob palpitations but is on  Med for pvcs   No neuro sx jaw pain chewing problem  Or vision hearing change or concussion sx   Past Medical History:  Diagnosis Date  . ALLERGIC RHINITIS CAUSE UNSPECIFIED 04/30/2009  . DEPRESSION, HX OF 04/11/2010  . Dysuria 05/06/2010  . GERD 04/30/2009  . HYPERLIPIDEMIA 04/30/2009  . HYPERTENSION 04/30/2009  . LEG EDEMA 12/12/2009  . OSTEOPENIA 04/30/2009    Family History  Problem Relation Age of Onset  . Emphysema Mother        heavy smoker  . Heart disease Mother 17  . Heart disease Father 50  . Arthritis Other   . Hyperlipidemia Other   . Hypertension Other   . Cancer Other    prostate    Social History   Socioeconomic History  . Marital status: Married    Spouse name: None  . Number of children: None  . Years of education: None  . Highest education level: None  Social Needs  . Financial resource strain: None  . Food insecurity - worry: None  . Food insecurity - inability: None  . Transportation needs - medical: None  . Transportation needs - non-medical: None  Occupational History  . None  Tobacco Use  . Smoking status: Never Smoker  . Smokeless tobacco: Never Used  Substance and Sexual Activity  . Alcohol use: None  . Drug use: None  . Sexual activity: None  Other Topics Concern  . None  Social History Narrative  . None    Outpatient Medications Prior to Visit  Medication  Sig Dispense Refill  . aspirin 81 MG tablet Take 81 mg by mouth daily.      . cetirizine (ZYRTEC) 5 MG tablet Take 5 mg by mouth as needed for allergies.    . cholecalciferol (VITAMIN D) 1000 units tablet Take 1,000 Units by mouth daily.    Marland Kitchen ibuprofen (ADVIL,MOTRIN) 200 MG tablet Take 200-400 mg by mouth daily as needed for moderate pain.    Marland Kitchen losartan-hydrochlorothiazide (HYZAAR) 100-25 MG tablet TAKE 1 TABLET BY MOUTH DAILY. 90 tablet 1  . metoprolol succinate (TOPROL-XL) 50 MG 24 hr tablet TAKE ONE TABLET BY MOUTH DAILY TAKE WITH OR IMMEDIATELY FOLLOWING A MEAL 90 tablet 2  . omeprazole (PRILOSEC) 20 MG capsule Take 20 mg by mouth 2 (two) times daily as needed (for acid reflux).     . sertraline (ZOLOFT) 50 MG tablet TAKE 1 TABLET (50 MG TOTAL) BY MOUTH DAILY. 90 tablet 1   No facility-administered medications prior to visit.      EXAM:  BP 140/72 (BP Location: Left Arm, Patient Position: Sitting, Cuff Size: Normal)   Pulse 66   Temp 97.7 F (36.5 C) (Oral)   Ht 5\' 5"  (1.651 m)   Wt 182 lb (82.6 kg)   SpO2 98%   BMI 30.29 kg/m   Body mass index is 30.29 kg/m.  GENERAL: vitals reviewed and listed above, alert, oriented, appears well hydrated and in no  acute distress iwht obvious 3+  facial bruising   Racoon eyes  With  Most on right forehead with  Some on right cheek with superficial abrasion   HEENT:conjunctiva  clear, no obvious abnormalities on inspection of external nose and ears tms  Normal no blood  No tmj or problem opening and closing   And eoms  No problems  OP : no lesion edema or exudate eoms nl  And orbits without step off   Tender right maxilla cause of  Swelling  NECK: no obvious masses on inspection palpation  LUNGS: clear to auscultation bilaterally, no wheezes, rales or rhonchi, good air movement  Chest  Bruise right breast  Some soreness mid sternum no crepitis or  Bruise there  CV: HRRR, no clubbing cyanosis or  peripheral edema nl cap refill  MS: moves all extremities without noticeable focal  Abnormality  Neuro non focal  Some unsteady Romberg but no drift  Gait nl with good balance but  Couldn't complete heel to to ( says not new) PSYCH: pleasant and cooperative, no obvious depression or anxiety nl speech orientation and   Attention  Lab Results  Component Value Date   WBC 9.9 03/19/2016   HGB 12.8 03/19/2016   HCT 37.3 03/19/2016   PLT 269.0 03/19/2016   GLUCOSE 93 03/28/2016   CHOL 200 03/19/2016   TRIG 53.0 03/19/2016   HDL 71.50 03/19/2016   LDLDIRECT 159.5 05/18/2013   LDLCALC 118 (H) 03/19/2016   ALT 12 03/19/2016   AST 16 03/19/2016   NA 137 03/28/2016   K 4.1 03/28/2016   CL 103 03/28/2016   CREATININE 0.74 03/28/2016   BUN 24 (H) 03/28/2016   CO2 29 03/28/2016   TSH 0.88 03/19/2016   INR 0.97 11/01/2015   BP Readings from Last 3 Encounters:  07/31/17 140/72  01/28/17 110/80  12/12/16 124/88    ASSESSMENT AND PLAN:  Discussed the following assessment and plan:  Fall, initial encounter  Facial injury, initial encounter  Falling episodes Significant fall with impressive facial bruising.  The most seems to  be coming from the right forehead and the right anterior face. There is no obvious  bony deformity and no symptoms of numbness instability dental or neurologic changes.  Discussed the option of doing a CT scan of her facial bones but with no nasal stuffiness bleeding and other findings I feel that it would be safe to follow her close clinically. No evidence of concussion. Discussed alarm findings to go for emergent care. She can stop the aspirin temporarily. More concerned about reason for fall although she tends to be more unsteady over the past year she might benefit from physical therapy other intervention. She does also have a history of palpitation which she calls PVCs and is on medicine for that although drop attack is in the differential diagnosis she has had no symptoms or prodrome before she ends up on the ground.  Expectant management and follow up with Dr. Elease Hashimoto in about a week or as needed. -Patient advised to return or notify health care team  if  new concerns arise.  Patient Instructions  Exam is reassuring.       I would ;ike you to see  PCP about  The falling  In general  . Sometimes   Physical   Therapy is helpful.    If get increase headache or    New sx  Seek emergent care .           Standley Brooking. Emyah Roznowski M.D.

## 2017-07-31 ENCOUNTER — Ambulatory Visit (INDEPENDENT_AMBULATORY_CARE_PROVIDER_SITE_OTHER): Payer: Medicare HMO | Admitting: Internal Medicine

## 2017-07-31 ENCOUNTER — Encounter: Payer: Self-pay | Admitting: Internal Medicine

## 2017-07-31 VITALS — BP 140/72 | HR 66 | Temp 97.7°F | Ht 65.0 in | Wt 182.0 lb

## 2017-07-31 DIAGNOSIS — R296 Repeated falls: Secondary | ICD-10-CM | POA: Diagnosis not present

## 2017-07-31 DIAGNOSIS — W19XXXA Unspecified fall, initial encounter: Secondary | ICD-10-CM

## 2017-07-31 DIAGNOSIS — S0993XA Unspecified injury of face, initial encounter: Secondary | ICD-10-CM

## 2017-07-31 NOTE — Patient Instructions (Addendum)
Exam is reassuring.       I would ;ike you to see  PCP about  The falling  In general  . Sometimes   Physical   Therapy is helpful.    If get increase headache or    New sx  Seek emergent care .

## 2017-08-05 ENCOUNTER — Encounter: Payer: Self-pay | Admitting: Family Medicine

## 2017-08-05 ENCOUNTER — Ambulatory Visit (INDEPENDENT_AMBULATORY_CARE_PROVIDER_SITE_OTHER): Payer: Medicare HMO | Admitting: Family Medicine

## 2017-08-05 VITALS — BP 110/80 | HR 60 | Temp 98.3°F | Wt 180.3 lb

## 2017-08-05 DIAGNOSIS — R002 Palpitations: Secondary | ICD-10-CM | POA: Diagnosis not present

## 2017-08-05 DIAGNOSIS — G44319 Acute post-traumatic headache, not intractable: Secondary | ICD-10-CM

## 2017-08-05 DIAGNOSIS — R4020 Unspecified coma: Secondary | ICD-10-CM

## 2017-08-05 DIAGNOSIS — S0083XD Contusion of other part of head, subsequent encounter: Secondary | ICD-10-CM

## 2017-08-05 NOTE — Progress Notes (Signed)
Subjective:     Patient ID: Rebecca Reid, female   DOB: [birth year], 72 y.o.   MRN: [id number]  HPI Patient seen following fall with head trauma one week ago today. She was coming out of [employer] and after coming down the ramp was heading toward another store when she apparently fell down. She does not recall actually going down. She does not recall any preceding dizziness or focal weakness. Was not clear whether she tripped and fell with LOC vs syncope leading to fall.  She was immediately aware of having some facial trauma. She had sunglasses on had bruising and swelling around her right eye . She was seen here later that day. she declined EMS transport to hospital. At that point, she had no other symptoms and felt very steady.  Since visit here last week she has developed some persistent lightheadedness and states she generally feels fatigued and "sleepy ". No further falls or focal weakness. She has history of palpitations and is on metoprolol 50 mg daily. She has hypertension which has been stable. She does not describe any recent orthostatic symptoms.  She does complain of low-grade bifrontal headache since her fall. She has extensive bruising of the face. No neck pain.  She has fallen probably she estimates 3 or 4 times in the past year so and attributes some of this to her "clumsiness ". She is not aware of any syncopal episodes. No history of seizure.  Cardiac cath 11/02/15 normal coronaries.   Past Medical History:  Diagnosis Date  . ALLERGIC RHINITIS CAUSE UNSPECIFIED 04/30/2009  . DEPRESSION, HX OF 04/11/2010  . Dysuria 05/06/2010  . GERD 04/30/2009  . HYPERLIPIDEMIA 04/30/2009  . HYPERTENSION 04/30/2009  . LEG EDEMA 12/12/2009  . OSTEOPENIA 04/30/2009   Past Surgical History:  Procedure Laterality Date  . ABDOMINAL HYSTERECTOMY  1986  . BREAST ENHANCEMENT SURGERY  1987  . BREAST SURGERY  1974   biopsy  . CARDIAC CATHETERIZATION N/A 11/02/2015   Procedure: Left Heart Cath and  Coronary Angiography;  Surgeon: Belva Crome, MD;  Location: [facility];  Service: Cardiovascular;  Laterality: N/A;  . CHOLECYSTECTOMY  1974    reports that  has never smoked. she has never used smokeless tobacco. Her alcohol and drug histories are not on file. family history includes Arthritis in her other; Cancer in her other; Emphysema in her mother; Heart disease (age of onset: 25) in her father; Heart disease (age of onset: 9) in her mother; Hyperlipidemia in her other; Hypertension in her other. Allergies  Allergen Reactions  . Erythromycin Base Other (See Comments)    yeast infection, GI upset  . Naproxen Other (See Comments)    GI upset  . Prednisone Hives    REACTION: hives, anxiety, insomnia  . Scallops [Shellfish Allergy] Nausea And Vomiting     Review of Systems  Constitutional: Positive for fatigue. Negative for chills and fever.  HENT: Negative for ear pain.   Eyes: Negative for visual disturbance.  Respiratory: Negative for cough, chest tightness, shortness of breath and wheezing.   Cardiovascular: Negative for chest pain, palpitations and leg swelling.  Neurological: Positive for dizziness, light-headedness and headaches. Negative for seizures, syncope and weakness.       Objective:   Physical Exam  Constitutional: She is oriented to person, place, and time. She appears well-developed and well-nourished.  HENT:  Extensive bruising on the face. She has hematoma right eyebrow region. No signs of cellulitis. She has some tenderness along the  superior and right lateral orbit  Eyes: EOM are normal. Pupils are equal, round, and reactive to light.  Neck: Neck supple.  Cardiovascular: Normal rate and regular rhythm. Exam reveals no gallop.  No murmur heard. Pulmonary/Chest: Effort normal and breath sounds normal. No respiratory distress. She has no wheezes. She has no rales.  Musculoskeletal: She exhibits no edema.  Neurological: She is alert and oriented to  person, place, and time. No cranial nerve deficit. Coordination normal.  No focal weakness. Gait normal       Assessment:     #1 recent fall with closed head trauma and probable postconcussive symptoms. Not clear whether she could've had syncopal episode versus fall with some retrograde amnesia  #2 hematoma right frontal region  #3 history of frequent palpitations    Plan:     -CT head without contrast -Set up cardiac event monitor -We discussed possible physical therapy referral and she declines at this time  [name] MD [facility]

## 2017-08-05 NOTE — Patient Instructions (Signed)
Concussion, Adult  A concussion is a brain injury from a direct hit (blow) to the head or body. This blow causes the brain to shake quickly back and forth inside the skull. This can damage brain cells and cause chemical changes in the brain. A concussion may also be known as a mild traumatic brain injury (TBI).  Concussions are usually not life-threatening, but the effects of a concussion can be serious. If you have a concussion, you are more likely to experience concussion-like symptoms after a direct blow to the head in the future.  What are the causes?  This condition is caused by:  · A direct blow to the head, such as from running into another player during a game, being hit in a fight, or hitting your head on a hard surface.  · A jolt of the head or neck that causes the brain to move back and forth inside the skull, such as in a car crash.    What are the signs or symptoms?  The signs of a concussion can be hard to notice. Early on, they may be missed by you, family members, and health care providers. You may look fine but act or feel differently.  Symptoms are usually temporary, but they may last for days, weeks, or even longer. Some symptoms may appear right away but other symptoms may not show up for hours or days. Every head injury is different. Symptoms may include:  · Headaches. This can include a feeling of pressure in the head.  · Memory problems.  · Trouble concentrating, organizing, or making decisions.  · Slowness in thinking, acting or reacting, speaking, or reading.  · Confusion.  · Fatigue.  · Changes in eating or sleeping patterns.  · Problems with coordination or balance.  · Nausea or vomiting.  · Numbness or tingling.  · Sensitivity to light or noise.  · Vision or hearing problems.  · Reduced sense of smell.  · Irritability or mood changes.  · Dizziness.  · Lack of motivation.  · Seeing or hearing things that other people do not see or hear (hallucinations).    How is this diagnosed?  This  condition is diagnosed based on:  · Your symptoms.  · A description of your injury.    You may also have tests, including:  · Imaging tests, such as a CT scan or MRI. These are done to look for signs of brain injury.  · Neuropsychological tests. These measure your thinking, understanding, learning, and remembering abilities.    How is this treated?  This condition is treated with physical and mental rest and careful observation, usually at home. If the concussion is severe, you may need to stay home from work for a while. You may be referred to a concussion clinic or to other health care providers for management. It is important that you tell your health care provider if:  · You are taking any medicines, including prescription medicines, over-the-counter medicines, and natural remedies. Some medicines, such as blood thinners (anticoagulants) and aspirin, may increase the chance of complications, such as bleeding.  · You are taking or have taken alcohol or illegal drugs. Alcohol and certain other drugs may slow your recovery and can put you at risk of further injury.    How fast you will recover from a concussion depends on many factors, such as how severe your concussion is, what part of your brain was injured, how old you are, and how healthy you were before   the concussion. Recovery can take time. It is important to wait to return to activity until a health care provider says it is safe to do that and your symptoms are completely gone.  Follow these instructions at home:  Activity  · Limit activities that require a lot of thought or concentration. These may include:  ? Doing homework or job-related work.  ? Watching TV.  ? Working on the computer.  ? Playing memory games and puzzles.  · Rest. Rest helps the brain to heal. Make sure you:  ? Get plenty of sleep at night. Avoid staying up late at night.  ? Keep the same bedtime hours on weekends and weekdays.  ? Rest during the day. Take naps or rest breaks when you  feel tired.  · Having another concussion before the first one has healed can be dangerous. Do not do high-risk activities that could cause a second concussion, such as riding a bicycle or playing sports.  · Ask your health care provider when you can return to your normal activities, such as school, work, athletics, driving, riding a bicycle, or using heavy machinery. Your ability to react may be slower after a brain injury. Never do these activities if you are dizzy. Your health care provider will likely give you a plan for gradually returning to activities.  General instructions  · Take over-the-counter and prescription medicines only as told by your health care provider.  · Do not drink alcohol until your health care provider says you can.  · If it is harder than usual to remember things, write them down.  · If you are easily distracted, try to do one thing at a time. For example, do not try to watch TV while fixing dinner.  · Talk with family members or close friends when making important decisions.  · Watch your symptoms and tell others to do the same. Complications sometimes occur after a concussion. Older adults with a brain injury may have a higher risk of serious complications, such as a blood clot in the brain.  · Tell your teachers, school nurse, school counselor, coach, athletic trainer, or work manager about your injury, symptoms, and restrictions. Tell them about what you can or cannot do. They should watch for:  ? Increased problems with attention or concentration.  ? Increased difficulty remembering or learning new information.  ? Increased time needed to complete tasks or assignments.  ? Increased irritability or decreased ability to cope with stress.  ? Increased symptoms.  · Keep all follow-up visits as told by your health care provider. This is important.  How is this prevented?  It is very important to avoid another brain injury, especially as you recover. In rare cases, another injury can lead  to permanent brain damage, brain swelling, or death. The risk of this is greatest during the first 7-10 days after a head injury. Avoid injuries by:  · Wearing a seat belt when riding in a car.  · Wearing a helmet when biking, skiing, skateboarding, skating, or doing similar activities.  · Avoiding activities that could lead to a second concussion, such as contact or recreational sports, until your health care provider says it is okay.  · Taking safety measures in your home, such as:  ? Removing clutter and tripping hazards from floors and stairways.  ? Using grab bars in bathrooms and handrails by stairs.  ? Placing non-slip mats on floors and in bathtubs.  ? Improving lighting in dim areas.      Contact a health care provider if:  · Your symptoms get worse.  · You have new symptoms.  · You continue to have symptoms for more than 2 weeks.  Get help right away if:  · You have severe or worsening headaches.  · You have weakness or numbness in any part of your body.  · Your coordination gets worse.  · You vomit repeatedly.  · You are sleepier.  · The pupil of one eye is larger than the other.  · You have convulsions or a seizure.  · Your speech is slurred.  · Your fatigue, confusion, or irritability gets worse.  · You cannot recognize people or places.  · You have neck pain.  · It is difficult to wake you up.  · You have unusual behavior changes.  · You lose consciousness.  Summary  · A concussion is a brain injury from a direct hit (blow) to the head or body.  · A concussion may also be called a mild traumatic brain injury (TBI).  · You may have imaging tests and neuropsychological tests to diagnose a concussion.  · This condition is treated with physical and mental rest and careful observation.  · Ask your health care provider when you can return to your normal activities, such as school, work, athletics, driving, riding a bicycle, or using heavy machinery. Follow safety instructions as told by your health care  provider.  This information is not intended to replace advice given to you by your health care provider. Make sure you discuss any questions you have with your health care provider.  Document Released: 09/27/2003 Document Revised: 06/17/2016 Document Reviewed: 06/17/2016  Elsevier Interactive Patient Education © 2018 [employer]

## 2017-08-06 ENCOUNTER — Telehealth: Payer: Self-pay | Admitting: Family Medicine

## 2017-08-06 ENCOUNTER — Inpatient Hospital Stay: Admission: RE | Admit: 2017-08-06 | Payer: Medicare HMO | Source: Ambulatory Visit

## 2017-08-06 NOTE — Telephone Encounter (Signed)
Copied from [place] [id number]. Topic: General - Other >> Aug 06, 2017  2:09 PM Lolita Rieger, RMA wrote: Reason for CRM: Tomiko G from [facility] called and would like a call back concerning a pre-cert for CT [id number] reference case #[id number]

## 2017-08-06 NOTE — Telephone Encounter (Signed)
This is still up for clinical review, not approved at this time. Requesting a peer to peer review with Dr. Elease Hashimoto.

## 2017-08-07 ENCOUNTER — Ambulatory Visit (INDEPENDENT_AMBULATORY_CARE_PROVIDER_SITE_OTHER)
Admission: RE | Admit: 2017-08-07 | Discharge: 2017-08-07 | Disposition: A | Discharge status: Home / Self Care | Payer: Medicare HMO | Source: Ambulatory Visit | Attending: Family Medicine | Admitting: Family Medicine

## 2017-08-07 DIAGNOSIS — G44319 Acute post-traumatic headache, not intractable: Secondary | ICD-10-CM | POA: Diagnosis not present

## 2017-08-07 DIAGNOSIS — R51 Headache: Secondary | ICD-10-CM | POA: Diagnosis not present

## 2017-08-07 NOTE — Telephone Encounter (Signed)
CT head authorization number is [id number]  See if we can get CT head today.

## 2017-08-07 NOTE — Telephone Encounter (Signed)
CT Scheduled

## 2017-08-07 NOTE — Telephone Encounter (Signed)
Please get a number to call- if I must speak with a physician.  She has major head trauma with persistent symptoms.  I cannot believe that they are not agreeing to cover this.

## 2017-08-07 NOTE — Telephone Encounter (Signed)
Phone number is  [phone]. Case number #[id number]

## 2017-08-11 ENCOUNTER — Other Ambulatory Visit: Payer: Medicare HMO

## 2017-08-18 ENCOUNTER — Encounter: Payer: Self-pay | Admitting: Family Medicine

## 2017-08-19 ENCOUNTER — Other Ambulatory Visit: Payer: Self-pay | Admitting: Family Medicine

## 2017-08-19 ENCOUNTER — Ambulatory Visit (INDEPENDENT_AMBULATORY_CARE_PROVIDER_SITE_OTHER): Payer: Medicare HMO

## 2017-08-19 DIAGNOSIS — R55 Syncope and collapse: Secondary | ICD-10-CM | POA: Diagnosis not present

## 2017-08-19 DIAGNOSIS — R4020 Unspecified coma: Secondary | ICD-10-CM

## 2017-08-19 DIAGNOSIS — R002 Palpitations: Secondary | ICD-10-CM

## 2017-08-21 ENCOUNTER — Other Ambulatory Visit: Payer: Self-pay | Admitting: Family Medicine

## 2017-08-25 DIAGNOSIS — G5762 Lesion of plantar nerve, left lower limb: Secondary | ICD-10-CM | POA: Diagnosis not present

## 2017-09-21 ENCOUNTER — Ambulatory Visit: Payer: Self-pay

## 2017-09-21 ENCOUNTER — Ambulatory Visit (INDEPENDENT_AMBULATORY_CARE_PROVIDER_SITE_OTHER): Payer: Medicare HMO | Admitting: Family Medicine

## 2017-09-21 ENCOUNTER — Encounter: Payer: Self-pay | Admitting: Family Medicine

## 2017-09-21 VITALS — BP 140/90 | HR 48 | Temp 98.3°F | Wt 178.2 lb

## 2017-09-21 DIAGNOSIS — J069 Acute upper respiratory infection, unspecified: Secondary | ICD-10-CM | POA: Diagnosis not present

## 2017-09-21 DIAGNOSIS — B9789 Other viral agents as the cause of diseases classified elsewhere: Secondary | ICD-10-CM

## 2017-09-21 MED ORDER — HYDROCOD POLST-CPM POLST ER 10-8 MG/5ML PO SUER: 5.0000 mL | Freq: Two times a day (BID) | ORAL | 0 refills | Status: DC | PRN

## 2017-09-21 NOTE — Telephone Encounter (Signed)
Pt. reports cough started 3-4 days ago.States her and her husband were on vacation and had to come home early. Feels "terrible - like I have the flu." No fever this morning. Non-productive cough.  Reason for Disposition . SEVERE coughing spells (e.g., whooping sound after coughing, vomiting after coughing)  Answer Assessment - Initial Assessment Questions 1. ONSET: "When did the cough begin?"      3-4 days ago 2. SEVERITY: "How bad is the cough today?"      Severe 3. RESPIRATORY DISTRESS: "Describe your breathing."      No distress 4. FEVER: "Do you have a fever?" If so, ask: "What is your temperature, how was it measured, and when did it start?"     No 5. HEMOPTYSIS: "Are you coughing up any blood?" If so ask: "How much?" (flecks, streaks, tablespoons, etc.)     No 6. TREATMENT: "What have you done so far to treat the cough?" (e.g., meds, fluids, humidifier)     Mucinex 7. CARDIAC HISTORY: "Do you have any history of heart disease?" (e.g., heart attack, congestive heart failure)      No 8. LUNG HISTORY: "Do you have any history of lung disease?"  (e.g., pulmonary embolus, asthma, emphysema)     nO 9. PE RISK FACTORS: "Do you have a history of blood clots?" (or: recent major surgery, recent prolonged travel, bedridden )     nO 10. OTHER SYMPTOMS: "Do you have any other symptoms? (e.g., runny nose, wheezing, chest pain)       Runny nose 11. PREGNANCY: "Is there any chance you are pregnant?" "When was your last menstrual period?"       No 12. TRAVEL: "Have you traveled out of the country in the last month?" (e.g., travel history, exposures)       No  Protocols used: COUGH - ACUTE NON-PRODUCTIVE-A-AH

## 2017-09-21 NOTE — Patient Instructions (Signed)

## 2017-09-21 NOTE — Progress Notes (Signed)
Subjective:     Patient ID: Rebecca Reid, female   DOB: 07-21-1946, 72 y.o.   MRN: [id number]  HPI Patient is nonsmoker seen with 4 day history of cough. Husband has similar symptoms. She's had some occasional lightheadedness but no syncope. No fever. Had some initial mild sore throat and also has some nasal congestion. She is not aware of any wheezing. Cough has been fairly severe at night. Not relieved with over-the-counter medication.  Past Medical History:  Diagnosis Date  . ALLERGIC RHINITIS CAUSE UNSPECIFIED 04/30/2009  . DEPRESSION, HX OF 04/11/2010  . Dysuria 05/06/2010  . GERD 04/30/2009  . HYPERLIPIDEMIA 04/30/2009  . HYPERTENSION 04/30/2009  . LEG EDEMA 12/12/2009  . OSTEOPENIA 04/30/2009   Past Surgical History:  Procedure Laterality Date  . ABDOMINAL HYSTERECTOMY  1986  . BREAST ENHANCEMENT SURGERY  1987  . BREAST SURGERY  1974   biopsy  . CARDIAC CATHETERIZATION N/A 11/02/2015   Procedure: Left Heart Cath and Coronary Angiography;  Surgeon: Belva Crome, MD;  Location: [facility];  Service: Cardiovascular;  Laterality: N/A;  . CHOLECYSTECTOMY  1974    reports that  has never smoked. she has never used smokeless tobacco. Her alcohol and drug histories are not on file. family history includes Arthritis in her other; Cancer in her other; Emphysema in her mother; Heart disease (age of onset: 61) in her father; Heart disease (age of onset: 7) in her mother; Hyperlipidemia in her other; Hypertension in her other. Allergies  Allergen Reactions  . Erythromycin Base Other (See Comments)    yeast infection, GI upset  . Naproxen Other (See Comments)    GI upset  . Prednisone Hives    REACTION: hives, anxiety, insomnia  . Scallops [Shellfish Allergy] Nausea And Vomiting     Review of Systems  Constitutional: Positive for fatigue.  HENT: Positive for congestion.   Respiratory: Positive for cough. Negative for shortness of breath and wheezing.   Cardiovascular:  Negative for chest pain.       Objective:   Physical Exam  Constitutional: She appears well-developed and well-nourished.  HENT:  Right Ear: External ear normal.  Left Ear: External ear normal.  Mouth/Throat: Oropharynx is clear and moist.  Neck: Neck supple.  Cardiovascular: Normal rate and regular rhythm.  Pulmonary/Chest: Effort normal and breath sounds normal. No respiratory distress. She has no wheezes. She has no rales.  Lymphadenopathy:    She has no cervical adenopathy.       Assessment:     Cough. Suspect acute viral bronchitis    Plan:     -Limited Tussionex 60 ml 1 teaspoon daily at bedtime when necessary for severe cough. Follow-up promptly for any fever or worsening symptoms  [name] MD [facility] at [facility]

## 2017-09-25 ENCOUNTER — Telehealth: Payer: Self-pay | Admitting: Family Medicine

## 2017-09-25 MED ORDER — DOXYCYCLINE HYCLATE 100 MG PO TABS: 100.0000 mg | ORAL_TABLET | Freq: Two times a day (BID) | ORAL | 0 refills | Status: DC

## 2017-09-25 NOTE — Telephone Encounter (Signed)
Copied from [name] [id number]. Topic: Inquiry >> Sep 25, 2017  9:52 AM [name] wrote: Pt was seen on Mon. - is still having head congestion that is getting worse and pt is feeling really bad.   Pt is asking if an antibiotic can be called in for her.

## 2017-09-25 NOTE — Telephone Encounter (Signed)
Let's send in Doxycycline 100 mg po bid for 7 days.   

## 2017-09-25 NOTE — Telephone Encounter (Signed)
Rx sent and patient is aware. 

## 2017-10-16 ENCOUNTER — Telehealth: Payer: Self-pay | Admitting: Family Medicine

## 2017-10-16 MED ORDER — DOXYCYCLINE HYCLATE 100 MG PO TABS: 100.0000 mg | ORAL_TABLET | Freq: Two times a day (BID) | ORAL | 0 refills | Status: DC

## 2017-10-16 NOTE — Telephone Encounter (Signed)
Rx done and I called the pt and informed her of the message below.

## 2017-10-16 NOTE — Telephone Encounter (Signed)
Let's go ahead with Doxycycline 100 mg po bid for 7 days and follow up if no better after that.

## 2017-10-16 NOTE — Telephone Encounter (Signed)
Copied from [place] [id number]. Topic: Quick Communication - See Telephone Encounter >> Oct 16, 2017 10:22 AM Bea Graff, NT wrote: CRM for notification. See Telephone encounter for: 10/16/17. Pt would like to see if another round of antibiotics can be called in for her cough/congestion. [place] in [place].

## 2017-10-21 ENCOUNTER — Other Ambulatory Visit: Payer: Self-pay | Admitting: Family Medicine

## 2017-11-03 ENCOUNTER — Encounter: Payer: Self-pay | Admitting: Family Medicine

## 2017-11-03 ENCOUNTER — Ambulatory Visit (INDEPENDENT_AMBULATORY_CARE_PROVIDER_SITE_OTHER): Payer: Medicare HMO | Admitting: Family Medicine

## 2017-11-03 VITALS — BP 110/70 | HR 83 | Temp 97.7°F | Wt 181.8 lb

## 2017-11-03 DIAGNOSIS — R1310 Dysphagia, unspecified: Secondary | ICD-10-CM | POA: Diagnosis not present

## 2017-11-03 DIAGNOSIS — K219 Gastro-esophageal reflux disease without esophagitis: Secondary | ICD-10-CM | POA: Diagnosis not present

## 2017-11-03 NOTE — Progress Notes (Signed)
Subjective:     Patient ID: Rebecca Reid, female   DOB: 1945-12-02, 72 y.o.   MRN: [id number]  HPI   Patient seen today to discuss the following issues  She has long history of GERD. She has recently had some substernal discomfort intermittently and gradual difficulties with swallowing solids at times. She increased Prilosec herself from 40 mg once daily to twice a day. Denies any consistent liquid dysphagia. No appetite changes. No weight loss. She states that she had EGD back in the 1990s with reported dilatation but has not required any follow-up procedure since then  Patient had fall back in January. CT head then no acute changes. She feels that she is having some ongoing symptoms related to that. Denies any consistent headaches but has some cognitive slowing at times and occasional nonspecific lightheadedness. She's also had some persistent right nasal congestion. CT scan revealed no sinus congestion then. Intermittent fatigue.  Past Medical History:  Diagnosis Date  . ALLERGIC RHINITIS CAUSE UNSPECIFIED 04/30/2009  . DEPRESSION, HX OF 04/11/2010  . Dysuria 05/06/2010  . GERD 04/30/2009  . HYPERLIPIDEMIA 04/30/2009  . HYPERTENSION 04/30/2009  . LEG EDEMA 12/12/2009  . OSTEOPENIA 04/30/2009   Past Surgical History:  Procedure Laterality Date  . ABDOMINAL HYSTERECTOMY  1986  . BREAST ENHANCEMENT SURGERY  1987  . BREAST SURGERY  1974   biopsy  . CARDIAC CATHETERIZATION N/A 11/02/2015   Procedure: Left Heart Cath and Coronary Angiography;  Surgeon: Belva Crome, MD;  Location: [facility];  Service: Cardiovascular;  Laterality: N/A;  . CHOLECYSTECTOMY  1974    reports that she has never smoked. She has never used smokeless tobacco. Her alcohol and drug histories are not on file. family history includes Arthritis in her other; Cancer in her other; Emphysema in her mother; Heart disease (age of onset: 76) in her father; Heart disease (age of onset: 51) in her mother;  Hyperlipidemia in her other; Hypertension in her other. Allergies  Allergen Reactions  . Erythromycin Base Other (See Comments)    yeast infection, GI upset  . Naproxen Other (See Comments)    GI upset  . Prednisone Hives    REACTION: hives, anxiety, insomnia  . Scallops [Shellfish Allergy] Nausea And Vomiting  \   Review of Systems  Constitutional: Negative for appetite change, chills, fever and unexpected weight change.  HENT: Positive for trouble swallowing. Negative for sinus pressure.   Respiratory: Negative for shortness of breath.   Cardiovascular: Negative for chest pain.  Gastrointestinal: Negative for abdominal pain.  Neurological: Positive for light-headedness. Negative for seizures, speech difficulty and weakness.       Objective:   Physical Exam  Constitutional: She is oriented to person, place, and time. She appears well-developed and well-nourished.  HENT:  Right Ear: External ear normal.  Left Ear: External ear normal.  Nose: Nose normal.  Mouth/Throat: Oropharynx is clear and moist.  Neck: Neck supple.  Cardiovascular: Normal rate and regular rhythm.  Pulmonary/Chest: Effort normal and breath sounds normal. No respiratory distress. She has no wheezes. She has no rales.  Lymphadenopathy:    She has no cervical adenopathy.  Neurological: She is alert and oriented to person, place, and time. No cranial nerve deficit. Coordination normal.       Assessment:     #1 recent progressive solid food dysphagia.. She's not any red flags such as appetite or weight change. Symptoms not improved with increased dose of PPI  #2 recent fall with concussion.  Still has had some mild residual symptoms.    Plan:     -Continue Prilosec for now -Set up GI referral to consider possible EGD -Discussed dietary management of GERD -avoid hard to chew foods in the meantime such as steak meat.    [name] MD [name]

## 2017-11-03 NOTE — Patient Instructions (Addendum)

## 2017-11-04 ENCOUNTER — Encounter: Payer: Self-pay | Admitting: Gastroenterology

## 2017-12-17 ENCOUNTER — Encounter: Payer: Self-pay | Admitting: Gastroenterology

## 2017-12-17 ENCOUNTER — Ambulatory Visit: Payer: Medicare HMO | Admitting: Gastroenterology

## 2017-12-17 ENCOUNTER — Encounter

## 2017-12-17 VITALS — BP 110/72 | HR 76 | Ht 65.0 in | Wt 181.0 lb

## 2017-12-17 DIAGNOSIS — R053 Chronic cough: Secondary | ICD-10-CM

## 2017-12-17 DIAGNOSIS — R69 Illness, unspecified: Secondary | ICD-10-CM | POA: Diagnosis not present

## 2017-12-17 DIAGNOSIS — R1314 Dysphagia, pharyngoesophageal phase: Secondary | ICD-10-CM

## 2017-12-17 DIAGNOSIS — R05 Cough: Secondary | ICD-10-CM | POA: Diagnosis not present

## 2017-12-17 DIAGNOSIS — R0989 Other specified symptoms and signs involving the circulatory and respiratory systems: Secondary | ICD-10-CM

## 2017-12-17 DIAGNOSIS — K219 Gastro-esophageal reflux disease without esophagitis: Secondary | ICD-10-CM

## 2017-12-17 DIAGNOSIS — F458 Other somatoform disorders: Secondary | ICD-10-CM

## 2017-12-17 NOTE — Progress Notes (Signed)
[facility] Consult Note:  History: Rebecca Reid 12/17/2017  Referring physician: [name], MD  Reason for consult/chief complaint: Dysphagia (liquids and solids, onset "awhile", SOB, coughs with her GERD)   Subjective  HPI:  This is a very pleasant 72 year old woman referred by Dr. Elease Hashimoto for chronic symptoms of reflux, dysphagia and throat discomfort.  She reports many years of reflux symptoms with pyrosis and occasional regurgitation.  She had an upper endoscopy in the 1990s while living in [place], and seems to recall having had a dilation performed.  [name] is bothered by a chronic throat discomfort that is often of a burning quality but also just feels like "something is in there".  It seems worse in the morning after she wakes up, she has exacerbation of chronic dry cough in the morning.  She often feels it is difficult to swallow solid food or even a glass of water, which will seem to "back-up" and cause coughing. She has not previously seen pulmonary or ENT.  At one point she had some allergy shots, and now she is maintained on Zyrtec and nasal steroid spray.  The above-noted symptoms were all worse within the last few months of allergy season.  Suprena takes to omeprazole 20 mg tablets every morning before breakfast, another at lunch, and another at bedtime.  Negative cologuard Sept 2017 No previous office visits or procedure encounters with this clinic were found in her chart.  ROS:  Review of Systems  Constitutional: Negative for appetite change and unexpected weight change.  HENT: Negative for mouth sores and voice change.   Eyes: Negative for pain and redness.  Respiratory: Negative for cough and shortness of breath.   Cardiovascular: Negative for chest pain and palpitations.  Genitourinary: Negative for dysuria and hematuria.  Musculoskeletal: Negative for arthralgias and myalgias.  Skin: Negative for pallor and rash.  Neurological:  Negative for weakness and headaches.  Hematological: Negative for adenopathy.     Past Medical History: Past Medical History:  Diagnosis Date  . ALLERGIC RHINITIS CAUSE UNSPECIFIED 04/30/2009  . Anxiety   . DEPRESSION, HX OF 04/11/2010  . Dysuria 05/06/2010  . Gallstones   . GERD 04/30/2009  . HYPERLIPIDEMIA 04/30/2009  . HYPERTENSION 04/30/2009  . LEG EDEMA 12/12/2009  . OSTEOPENIA 04/30/2009  . PVC (premature ventricular contraction)   . Status post dilation of esophageal narrowing      Past Surgical History: Past Surgical History:  Procedure Laterality Date  . ABDOMINAL HYSTERECTOMY  1986  . BREAST ENHANCEMENT SURGERY  1987  . BREAST SURGERY  1974   biopsy  . CARDIAC CATHETERIZATION N/A 11/02/2015   Procedure: Left Heart Cath and Coronary Angiography;  Surgeon: Belva Crome, MD;  Location: [facility];  Service: Cardiovascular;  Laterality: N/A;  . CHOLECYSTECTOMY  1974  . ectopic pregnancy    . OTHER SURGICAL HISTORY     Stomach was not developed at birth     Family History: Family History  Problem Relation Age of Onset  . Emphysema Mother        heavy smoker  . Heart disease Mother 66  . Hyperlipidemia Mother   . Hypertension Mother   . Skin cancer Mother        melanomia, spread to her spine  . Arthritis Mother   . Heart disease Father 41  . Prostate cancer Father   . Hyperlipidemia Father   . Hypertension Father   . Colon cancer Neg Hx   .  Esophageal cancer Neg Hx   . Rectal cancer Neg Hx     Social History: Social History   Socioeconomic History  . Marital status: Married    Spouse name: Not on file  . Number of children: 3  . Years of education: Not on file  . Highest education level: Not on file  Occupational History  . Occupation: retired  Scientific laboratory technician  . Financial resource strain: Not on file  . Food insecurity:    Worry: Not on file    Inability: Not on file  . Transportation needs:    Medical: Not on file    Non-medical:  Not on file  Tobacco Use  . Smoking status: Never Smoker  . Smokeless tobacco: Never Used  Substance and Sexual Activity  . Alcohol use: Yes    Comment: social  . Drug use: Never  . Sexual activity: Not on file  Lifestyle  . Physical activity:    Days per week: Not on file    Minutes per session: Not on file  . Stress: Not on file  Relationships  . Social connections:    Talks on phone: Not on file    Gets together: Not on file    Attends religious service: Not on file    Active member of club or organization: Not on file    Attends meetings of clubs or organizations: Not on file    Relationship status: Not on file  Other Topics Concern  . Not on file  Social History Narrative  . Not on file    Allergies: Allergies  Allergen Reactions  . Erythromycin Base Other (See Comments)    yeast infection, GI upset  . Naproxen Other (See Comments)    GI upset  . Prednisone Hives    REACTION: hives, anxiety, insomnia  . Scallops [Shellfish Allergy] Nausea And Vomiting    Outpatient Meds: Current Outpatient Medications  Medication Sig Dispense Refill  . aspirin 81 MG tablet Take 81 mg by mouth daily.      . cetirizine (ZYRTEC) 5 MG tablet Take 5 mg by mouth as needed for allergies.    . cholecalciferol (VITAMIN D) 1000 units tablet Take 1,000 Units by mouth daily.    Marland Kitchen ibuprofen (ADVIL,MOTRIN) 200 MG tablet Take 200-400 mg by mouth daily as needed for moderate pain.    Marland Kitchen losartan-hydrochlorothiazide (HYZAAR) 100-12.5 MG tablet Take 0.5 tablets by mouth daily.    . Melatonin 10 MG TABS Take by mouth.    . metoprolol succinate (TOPROL-XL) 50 MG 24 hr tablet TAKE ONE (1) TABLET BY MOUTH EVERY DAY WITH OR IMMEDIATELY FOLLOWING A MEAL 90 tablet 1  . omeprazole (PRILOSEC) 40 MG capsule Take 40 mg by mouth daily.    . sertraline (ZOLOFT) 50 MG tablet TAKE ONE (1) TABLET BY MOUTH EVERY DAY 90 tablet 0   No current facility-administered medications for this visit.        ___________________________________________________________________ Objective   Exam:  BP 110/72   Pulse 76   Ht 5\' 5"  (1.651 m)   Wt 181 lb (82.1 kg)   BMI 30.12 kg/m    General: this is a(n) well-appearing woman with normal vocal quality.  During the exam she sometimes sniffs, clears throat or has a dry cough  Eyes: sclera anicteric, no redness  ENT: oral mucosa moist without lesions, no cervical or supraclavicular lymphadenopathy, good dentition.  No thyromegaly, tenderness or palpable abnormality in the neck.  CV: RRR without murmur, S1/S2, no JVD,  no peripheral edema  Resp: clear to auscultation bilaterally, normal RR and effort noted  GI: soft, no tenderness, with active bowel sounds. No guarding or palpable organomegaly noted.  Skin; warm and dry, no rash or jaundice noted  Neuro: awake, alert and oriented x 3. Normal gross motor function and fluent speech   Assessment: Encounter Diagnoses  Name Primary?  . Gastroesophageal reflux disease, esophagitis presence not specified Yes  . Pharyngoesophageal dysphagia   . Chronic cough   . Globus sensation     While she does have some GERD symptoms, I suspect there may be an allergic/postnasal drip component to the symptoms as well.  Plan:  Continue the morning omeprazole dose as she is currently taking it, but change the later doses to having them both at supper meal. Upper endoscopy with possible dilation if mechanical cause of dysphagia found.  She is agreeable after discussion of procedure and risks.  She may need further evaluation of an attention to allergy symptoms depending upon EGD findings.  Thank you for the courtesy of this consult.  Please call me with any questions or concerns.  Nelida Meuse III  CC: [name], MD

## 2017-12-17 NOTE — Patient Instructions (Signed)
If you are age 71 or older, your body mass index should be between 23-30. Your Body mass index is 30.12 kg/m. If this is out of the aforementioned range listed, please consider follow up with your Primary Care Provider.  If you are age 94 or younger, your body mass index should be between 19-25. Your Body mass index is 30.12 kg/m. If this is out of the aformentioned range listed, please consider follow up with your Primary Care Provider.   You have been scheduled for an endoscopy. Please follow written instructions given to you at your visit today. If you use inhalers (even only as needed), please bring them with you on the day of your procedure. Your physician has requested that you go to www.startemmi.com and enter the access code given to you at your visit today. This web site gives a general overview about your procedure. However, you should still follow specific instructions given to you by our office regarding your preparation for the procedure.  It was a pleasure to meet you today!  Dr. Loletha Carrow

## 2017-12-30 ENCOUNTER — Encounter: Payer: Self-pay | Admitting: Gastroenterology

## 2018-01-01 ENCOUNTER — Ambulatory Visit (INDEPENDENT_AMBULATORY_CARE_PROVIDER_SITE_OTHER): Payer: Medicare HMO | Admitting: Family Medicine

## 2018-01-01 ENCOUNTER — Encounter: Payer: Self-pay | Admitting: Family Medicine

## 2018-01-01 VITALS — BP 120/84 | HR 68 | Temp 97.9°F | Wt 180.6 lb

## 2018-01-01 DIAGNOSIS — M7661 Achilles tendinitis, right leg: Secondary | ICD-10-CM

## 2018-01-01 MED ORDER — DICLOFENAC SODIUM 1 % TD GEL: 2.0000 g | Freq: Four times a day (QID) | TRANSDERMAL | 1 refills | Status: DC

## 2018-01-01 NOTE — Patient Instructions (Signed)
Achilles Tendinitis  Achilles tendinitis is inflammation of the tough, cord-like band that attaches the lower leg muscles to the heel bone (Achilles tendon). This is usually caused by overusing the tendon and the ankle joint.  Achilles tendinitis usually gets better over time with treatment and caring for yourself at home. It can take weeks or months to heal completely.  What are the causes?  This condition may be caused by:  · A sudden increase in exercise or activity, such as running.  · Doing the same exercises or activities (such as jumping) over and over.  · Not warming up calf muscles before exercising.  · Exercising in shoes that are worn out or not made for exercise.  · Having arthritis or a bone growth (spur) on the back of the heel bone. This can rub against the tendon and hurt it.  · Age-related wear and tear. Tendons become less flexible with age and more likely to be injured.    What are the signs or symptoms?  Common symptoms of this condition include:  · Pain in the Achilles tendon or in the back of the leg, just above the heel. The pain usually gets worse with exercise.  · Stiffness or soreness in the back of the leg, especially in the morning.  · Swelling of the skin over the Achilles tendon.  · Thickening of the tendon.  · Bone spurs at the bottom of the Achilles tendon, near the heel.  · Trouble standing on tiptoe.    How is this diagnosed?  This condition is diagnosed based on your symptoms and a physical exam. You may have tests, including:  · X-rays.  · MRI.    How is this treated?  The goal of treatment is to relieve symptoms and help your injury heal. Treatment may include:  · Decreasing or stopping activities that caused the tendinitis. This may mean switching to low-impact exercises like biking or swimming.  · Icing the injured area.  · Doing physical therapy, including strengthening and stretching exercises.  · NSAIDs to help relieve pain and swelling.  · Using supportive shoes, wraps,  heel lifts, or a walking boot (air cast).  · Surgery. This may be done if your symptoms do not improve after 6 months.  · Using high-energy shock wave impulses to stimulate the healing process (extracorporeal shock wave therapy). This is rare.  · Injection of medicines to help relieve inflammation (corticosteroids). This is rare.    Follow these instructions at home:  If you have an air cast:   · Wear the cast as told by your health care provider. Remove it only as told by your health care provider.  · Loosen the cast if your toes tingle, become numb, or turn cold and blue.  Activity   · Gradually return to your normal activities once your health care provider approves. Do not do activities that cause pain.  ? Consider doing low-impact exercises, like cycling or swimming.  · If you have an air cast, ask your health care provider when it is safe for you to drive.  · If physical therapy was prescribed, do exercises as told by your health care provider or physical therapist.  Managing pain, stiffness, and swelling   · Raise (elevate) your foot above the level of your heart while you are sitting or lying down.  · Move your toes often to avoid stiffness and to lessen swelling.  · If directed, put ice on the injured area:  ?   Put ice in a plastic bag.  ? Place a towel between your skin and the bag.  ? Leave the ice on for 20 minutes, 2-3 times a day  General instructions   · If directed, wrap your foot with an elastic bandage or other wrap. This can help keep your tendon from moving too much while it heals. Your health care provider will show you how to wrap your foot correctly.  · Wear supportive shoes or heel lifts only as told by your health care provider.  · Take over-the-counter and prescription medicines only as told by your health care provider.  · Keep all follow-up visits as told by your health care provider. This is important.  Contact a health care provider if:  · You have symptoms that gets worse.  · You have  pain that does not get better with medicine.  · You develop new, unexplained symptoms.  · You develop warmth and swelling in your foot.  · You have a fever.  Get help right away if:  · You have a sudden popping sound or sensation in your Achilles tendon followed by severe pain.  · You cannot move your toes or foot.  · You cannot put any weight on your foot.  Summary  · Achilles tendinitis is inflammation of the tough, cord-like band that attaches the lower leg muscles to the heel bone (Achilles tendon).  · This condition is usually caused by overusing the tendon and the ankle joint. It can also be caused by arthritis or normal aging.  · The most common symptoms of this condition include pain, swelling, or stiffness in the Achilles tendon or in the back of the leg.  · This condition is usually treated with rest, NSAIDs, and physical therapy.  This information is not intended to replace advice given to you by your health care provider. Make sure you discuss any questions you have with your health care provider.  Document Released: 04/16/2005 Document Revised: 05/26/2016 Document Reviewed: 05/26/2016  [employer]vier Interactive Patient Education © 2017 [employer]

## 2018-01-01 NOTE — Progress Notes (Signed)
  Subjective:     Patient ID: Rebecca Reid, female   DOB: 10-Jun-1946, 72 y.o.   MRN: [id number]  HPI Patient seen with right Achilles pain. Onset about 3 weeks ago. Denies any injury. No change of shoe wear. She describes achy pain which is worse when first getting up in the morning and after prolonged sitting. She's tried some ice and Biofreeze with minimal improvement. No weakness. No numbness.  Past Medical History:  Diagnosis Date  . ALLERGIC RHINITIS CAUSE UNSPECIFIED 04/30/2009  . Anxiety   . DEPRESSION, HX OF 04/11/2010  . Dysuria 05/06/2010  . Gallstones   . GERD 04/30/2009  . HYPERLIPIDEMIA 04/30/2009  . HYPERTENSION 04/30/2009  . LEG EDEMA 12/12/2009  . OSTEOPENIA 04/30/2009  . PVC (premature ventricular contraction)   . Status post dilation of esophageal narrowing    Past Surgical History:  Procedure Laterality Date  . ABDOMINAL HYSTERECTOMY  1986  . BREAST ENHANCEMENT SURGERY  1987  . BREAST SURGERY  1974   biopsy  . CARDIAC CATHETERIZATION N/A 11/02/2015   Procedure: Left Heart Cath and Coronary Angiography;  Surgeon: Belva Crome, MD;  Location: [facility];  Service: Cardiovascular;  Laterality: N/A;  . CHOLECYSTECTOMY  1974  . ectopic pregnancy    . OTHER SURGICAL HISTORY     Stomach was not developed at birth    reports that she has never smoked. She has never used smokeless tobacco. She reports that she drinks alcohol. She reports that she does not use drugs. family history includes Arthritis in her mother; Emphysema in her mother; Heart disease (age of onset: 25) in her father; Heart disease (age of onset: 30) in her mother; Hyperlipidemia in her father and mother; Hypertension in her father and mother; Prostate cancer in her father; Skin cancer in her mother. Allergies  Allergen Reactions  . Erythromycin Base Other (See Comments)    yeast infection, GI upset  . Naproxen Other (See Comments)    GI upset  . Prednisone Hives    REACTION: hives,  anxiety, insomnia  . Scallops [Shellfish Allergy] Nausea And Vomiting     Review of Systems  Neurological: Negative for weakness and numbness.       Objective:   Physical Exam  Constitutional: She appears well-developed and well-nourished.  Cardiovascular: Normal rate and regular rhythm.  Pulmonary/Chest: Effort normal and breath sounds normal.  Musculoskeletal:  Right foot ankle and leg are inspected. No erythema. She has some tenderness at the distal attachment Achilles to the calcaneus. No palpable defects. She has full strength of plantarflexion and dorsiflexion. Minimal retrocalcaneal tenderness       Assessment:     Right Achilles tendinitis    Plan:     -Continue icing 20 minutes 2-3 times daily -Recommend trial of diclofenac 0.1% gel 3-4 times daily as needed -Try some gentle stretches -Touch base if not improving in 2-3 weeks  [name] MD [facility] at [facility]

## 2018-01-06 ENCOUNTER — Telehealth: Payer: Self-pay | Admitting: *Deleted

## 2018-01-06 NOTE — Telephone Encounter (Signed)
PA for diclofenac 1% gel initiated via CoverMyMeds. Awaiting determination. Key: [facility]

## 2018-01-07 ENCOUNTER — Ambulatory Visit (AMBULATORY_SURGERY_CENTER): Payer: Medicare HMO | Admitting: Gastroenterology

## 2018-01-07 ENCOUNTER — Encounter: Payer: Self-pay | Admitting: Gastroenterology

## 2018-01-07 ENCOUNTER — Other Ambulatory Visit: Payer: Self-pay

## 2018-01-07 VITALS — BP 123/68 | HR 54 | Temp 98.7°F | Resp 17 | Ht 65.0 in | Wt 181.0 lb

## 2018-01-07 DIAGNOSIS — R0989 Other specified symptoms and signs involving the circulatory and respiratory systems: Secondary | ICD-10-CM

## 2018-01-07 DIAGNOSIS — R1314 Dysphagia, pharyngoesophageal phase: Secondary | ICD-10-CM | POA: Diagnosis not present

## 2018-01-07 DIAGNOSIS — K208 Other esophagitis: Secondary | ICD-10-CM | POA: Diagnosis not present

## 2018-01-07 DIAGNOSIS — K219 Gastro-esophageal reflux disease without esophagitis: Secondary | ICD-10-CM

## 2018-01-07 DIAGNOSIS — I10 Essential (primary) hypertension: Secondary | ICD-10-CM | POA: Diagnosis not present

## 2018-01-07 DIAGNOSIS — R1310 Dysphagia, unspecified: Secondary | ICD-10-CM | POA: Diagnosis not present

## 2018-01-07 MED ORDER — SODIUM CHLORIDE 0.9 % IV SOLN: 500.0000 mL | Freq: Once | INTRAVENOUS | Status: DC

## 2018-01-07 NOTE — Patient Instructions (Signed)
YOU HAD AN ENDOSCOPIC PROCEDURE TODAY AT THE [facility]:   Refer to the procedure report that was given to you for any specific questions about what was found during the examination.  If the procedure report does not answer your questions, please call your gastroenterologist to clarify.  If you requested that your care partner not be given the details of your procedure findings, then the procedure report has been included in a sealed envelope for you to review at your convenience later.  YOU SHOULD EXPECT: Some feelings of bloating in the abdomen. Passage of more gas than usual.  Walking can help get rid of the air that was put into your GI tract during the procedure and reduce the bloating. If you had a lower endoscopy (such as a colonoscopy or flexible sigmoidoscopy) you may notice spotting of blood in your stool or on the toilet paper. If you underwent a bowel prep for your procedure, you may not have a normal bowel movement for a few days.  Please Note:  You might notice some irritation and congestion in your nose or some drainage.  This is from the oxygen used during your procedure.  There is no need for concern and it should clear up in a day or so.  SYMPTOMS TO REPORT IMMEDIATELY:   Following upper endoscopy (EGD)  Vomiting of blood or coffee ground material  New chest pain or pain under the shoulder blades  Painful or persistently difficult swallowing  New shortness of breath  Fever of 100F or higher  Black, tarry-looking stools  For urgent or emergent issues, a gastroenterologist can be reached at any hour by calling [phone].   DIET:  We do recommend a small meal at first, but then you may proceed to your regular diet.  Drink plenty of fluids but you should avoid alcoholic beverages for 24 hours.  ACTIVITY:  You should plan to take it easy for the rest of today and you should NOT DRIVE or use heavy machinery until tomorrow (because of the sedation medicines used  during the test).    FOLLOW UP: Our staff will call the number listed on your records the next business day following your procedure to check on you and address any questions or concerns that you may have regarding the information given to you following your procedure. If we do not reach you, we will leave a message.  However, if you are feeling well and you are not experiencing any problems, there is no need to return our call.  We will assume that you have returned to your regular daily activities without incident.  If any biopsies were taken you will be contacted by phone or by letter within the next 1-3 weeks.  Please call us at [phone] if you have not heard about the biopsies in 3 weeks.  Await for biopsy results  SIGNATURES/CONFIDENTIALITY: You and/or your care partner have signed paperwork which will be entered into your electronic medical record.  These signatures attest to the fact that that the information above on your After Visit Summary has been reviewed and is understood.  Full responsibility of the confidentiality of this discharge information lies with you and/or your care-partner. 

## 2018-01-07 NOTE — Op Note (Signed)
[place] Patient Name: Rebecca Reid Procedure Date: 01/07/2018 10:23 AM MRN: [id number] Endoscopist: Mallie Mussel L. Loletha Carrow , MD Age: 72 Referring MD:  Date of Birth: Oct 22, 1945 Gender: Female Account #: [id number] Procedure:                Upper GI endoscopy Indications:              Dysphagia, Heartburn, Globus sensation Medicines:                Monitored Anesthesia Care Procedure:                Pre-Anesthesia Assessment:                           - Prior to the procedure, a History and Physical                            was performed, and patient medications and                            allergies were reviewed. The patient's tolerance of                            previous anesthesia was also reviewed. The risks                            and benefits of the procedure and the sedation                            options and risks were discussed with the patient.                            All questions were answered, and informed consent                            was obtained. Anticoagulants: The patient has taken                            aspirin. It was decided not to withhold this                            medication prior to the procedure. ASA Grade                            Assessment: II - A patient with mild systemic                            disease. After reviewing the risks and benefits,                            the patient was deemed in satisfactory condition to                            undergo the procedure.  After obtaining informed consent, the endoscope was                            passed under direct vision. Throughout the                            procedure, the patient's blood pressure, pulse, and                            oxygen saturations were monitored continuously. The                            Endoscope was introduced through the mouth, and                            advanced to the second part of duodenum.  The upper                            GI endoscopy was accomplished without difficulty.                            The patient tolerated the procedure well. Scope In: Scope Out: Findings:                 The larynx was normal.                           One tongue of salmon-colored mucosa was present and                            islands of salmon-colored mucosa were present. No                            other visible abnormalities were present. The                            maximum longitudinal extent of these esophageal                            mucosal changes was 2 cm in length. Biopsies were                            taken with a cold forceps for histology.                           A small hiatal hernia was present.                           The exam of the esophagus was otherwise normal.                           The stomach was normal.                           The cardia and gastric fundus  were normal on                            retroflexion.                           The examined duodenum was normal. Complications:            No immediate complications. Estimated Blood Loss:     Estimated blood loss was minimal. Impression:               - Normal larynx.                           - Salmon-colored mucosa. Biopsied.                           - Small hiatal hernia.                           - Normal stomach.                           - Normal examined duodenum. Recommendation:           - Patient has a contact number available for                            emergencies. The signs and symptoms of potential                            delayed complications were discussed with the                            patient. Return to normal activities tomorrow.                            Written discharge instructions were provided to the                            patient.                           - Resume previous diet.                           - Continue present medications.                            - Await pathology results.                           - Return to primary care physician or ENT to                            address allergies/post nasal drip contributing to                            throat discomfort. Sergei Delo L. Loletha Carrow, MD 01/07/2018  10:43:57 AM This report has been signed electronically.

## 2018-01-07 NOTE — Progress Notes (Signed)
Report given to PACU, vss 

## 2018-01-07 NOTE — Progress Notes (Signed)
Called to room to assist during endoscopic procedure.  Patient ID and intended procedure confirmed with present staff. Received instructions for my participation in the procedure from the performing physician.  

## 2018-01-07 NOTE — Telephone Encounter (Signed)
Status: Approved, Coverage Starts on: 07/19/2017, Coverage Ends on: 07/20/2018. Left voicemail at pharmacy notifying them of approval.

## 2018-01-08 ENCOUNTER — Telehealth: Payer: Self-pay | Admitting: *Deleted

## 2018-01-08 NOTE — Telephone Encounter (Signed)
  Follow up Call-  Call back number 01/07/2018  Post procedure Call Back phone  # [phone]  Permission to leave phone message Yes  Some recent data might be hidden     Patient questions:  Do you have a fever, pain , or abdominal swelling? No. Pain Score  0 *  Have you tolerated food without any problems? Yes.    Have you been able to return to your normal activities? Yes.    Do you have any questions about your discharge instructions: Diet   No. Medications  No. Follow up visit  No.  Do you have questions or concerns about your Care? No.  Actions: * If pain score is 4 or above: No action needed, pain <4.

## 2018-01-18 ENCOUNTER — Encounter: Payer: Self-pay | Admitting: Gastroenterology

## 2018-01-19 ENCOUNTER — Encounter: Payer: Self-pay | Admitting: Family Medicine

## 2018-01-19 DIAGNOSIS — R0981 Nasal congestion: Secondary | ICD-10-CM

## 2018-01-20 NOTE — Addendum Note (Signed)
Addended by: Rebecca Eaton on: 01/20/2018 11:33 AM   Modules accepted: Orders

## 2018-01-25 DIAGNOSIS — H9313 Tinnitus, bilateral: Secondary | ICD-10-CM | POA: Diagnosis not present

## 2018-01-25 DIAGNOSIS — J310 Chronic rhinitis: Secondary | ICD-10-CM | POA: Diagnosis not present

## 2018-02-08 DIAGNOSIS — R69 Illness, unspecified: Secondary | ICD-10-CM | POA: Diagnosis not present

## 2018-02-26 ENCOUNTER — Encounter: Payer: Self-pay | Admitting: Family Medicine

## 2018-02-26 MED ORDER — METOPROLOL SUCCINATE ER 50 MG PO TB24: ORAL_TABLET | ORAL | 0 refills | Status: DC

## 2018-02-26 MED ORDER — LOSARTAN POTASSIUM-HCTZ 100-25 MG PO TABS: 0.5000 | ORAL_TABLET | Freq: Every day | ORAL | 0 refills | Status: DC

## 2018-04-06 ENCOUNTER — Ambulatory Visit (INDEPENDENT_AMBULATORY_CARE_PROVIDER_SITE_OTHER): Payer: Medicare HMO | Admitting: Family Medicine

## 2018-04-06 ENCOUNTER — Encounter: Payer: Self-pay | Admitting: Family Medicine

## 2018-04-06 ENCOUNTER — Other Ambulatory Visit: Payer: Self-pay

## 2018-04-06 ENCOUNTER — Ambulatory Visit
Admission: RE | Admit: 2018-04-06 | Discharge: 2018-04-06 | Disposition: A | Discharge status: Home / Self Care | Payer: Medicare HMO | Source: Ambulatory Visit | Attending: Family Medicine | Admitting: Family Medicine

## 2018-04-06 ENCOUNTER — Other Ambulatory Visit: Payer: Self-pay | Admitting: Family Medicine

## 2018-04-06 VITALS — BP 134/80 | Temp 97.6°F | Resp 15 | Ht 64.0 in | Wt 178.7 lb

## 2018-04-06 DIAGNOSIS — Z1231 Encounter for screening mammogram for malignant neoplasm of breast: Secondary | ICD-10-CM

## 2018-04-06 DIAGNOSIS — Z9181 History of falling: Secondary | ICD-10-CM

## 2018-04-06 DIAGNOSIS — Z23 Encounter for immunization: Secondary | ICD-10-CM

## 2018-04-06 DIAGNOSIS — Z0000 Encounter for general adult medical examination without abnormal findings: Secondary | ICD-10-CM | POA: Diagnosis not present

## 2018-04-06 LAB — CBC WITH DIFFERENTIAL/PLATELET
BASOS PCT: 0.6 % (ref 0.0–3.0)
Basophils Absolute: 0 10*3/uL (ref 0.0–0.1)
EOS PCT: 2.7 % (ref 0.0–5.0)
Eosinophils Absolute: 0.2 10*3/uL (ref 0.0–0.7)
HEMATOCRIT: 40.7 % (ref 36.0–46.0)
Hemoglobin: 13.7 g/dL (ref 12.0–15.0)
LYMPHS PCT: 22.1 % (ref 12.0–46.0)
Lymphs Abs: 1.4 10*3/uL (ref 0.7–4.0)
MCHC: 33.6 g/dL (ref 30.0–36.0)
MCV: 83 fl (ref 78.0–100.0)
MONOS PCT: 6.9 % (ref 3.0–12.0)
Monocytes Absolute: 0.4 10*3/uL (ref 0.1–1.0)
Neutro Abs: 4.3 10*3/uL (ref 1.4–7.7)
Neutrophils Relative %: 67.7 % (ref 43.0–77.0)
PLATELETS: 242 10*3/uL (ref 150.0–400.0)
RBC: 4.9 Mil/uL (ref 3.87–5.11)
RDW: 14.8 % (ref 11.5–15.5)
WBC: 6.3 10*3/uL (ref 4.0–10.5)

## 2018-04-06 LAB — HEPATIC FUNCTION PANEL
ALT: 12 U/L (ref 0–35)
AST: 15 U/L (ref 0–37)
Albumin: 4.1 g/dL (ref 3.5–5.2)
Alkaline Phosphatase: 78 U/L (ref 39–117)
Bilirubin, Direct: 0.1 mg/dL (ref 0.0–0.3)
TOTAL PROTEIN: 6.5 g/dL (ref 6.0–8.3)
Total Bilirubin: 0.7 mg/dL (ref 0.2–1.2)

## 2018-04-06 LAB — BASIC METABOLIC PANEL
BUN: 21 mg/dL (ref 6–23)
CALCIUM: 9.5 mg/dL (ref 8.4–10.5)
CO2: 26 mEq/L (ref 19–32)
Chloride: 105 mEq/L (ref 96–112)
Creatinine, Ser: 0.78 mg/dL (ref 0.40–1.20)
GFR: 77.35 mL/min (ref >60.00)
GLUCOSE: 96 mg/dL (ref 70–99)
Potassium: 3.9 mEq/L (ref 3.5–5.1)
Sodium: 141 mEq/L (ref 135–145)

## 2018-04-06 LAB — LIPID PANEL
CHOLESTEROL: 201 mg/dL — AB (ref 0–200)
HDL: 59.1 mg/dL (ref >39.00)
LDL Cholesterol: 126 mg/dL — ABNORMAL HIGH (ref 0–99)
NonHDL: 141.85
Total CHOL/HDL Ratio: 3
Triglycerides: 80 mg/dL (ref 0.0–149.0)
VLDL: 16 mg/dL (ref 0.0–40.0)

## 2018-04-06 LAB — TSH: TSH: 3.03 u[IU]/mL (ref 0.35–4.50)

## 2018-04-06 NOTE — Progress Notes (Signed)
Subjective:     Patient ID: Rebecca Reid, female   DOB: 08/28/1945, 72 y.o.   MRN: [id number]  HPI Patient seen for physical exam. Chronic problems include history of GERD, hypertension, osteopenia, dyslipidemia, history of depression. She's had some recent globus symptoms. EGD was basically unremarkable. Symptoms currently stable. She notes that she has felt frequently "off balance "over the past year. She had one serious fall about 9 months ago and is slowly recover from that. No other falls since then.  Needs flu vaccine. Also needs Pneumovax. She had previous shingles vaccine but would like to consider new vaccine if covered by insurance. She had cologuard 2 years ago. No history of hepatitis C screening. She states she had blood transfusion 1974. She is due for repeat mammogram.  Past Medical History:  Diagnosis Date  . ALLERGIC RHINITIS CAUSE UNSPECIFIED 04/30/2009  . Anxiety   . DEPRESSION, HX OF 04/11/2010  . Dysuria 05/06/2010  . Gallstones   . GERD 04/30/2009  . HYPERLIPIDEMIA 04/30/2009  . HYPERTENSION 04/30/2009  . LEG EDEMA 12/12/2009  . OSTEOPENIA 04/30/2009  . PVC (premature ventricular contraction)   . Status post dilation of esophageal narrowing    Past Surgical History:  Procedure Laterality Date  . ABDOMINAL HYSTERECTOMY  1986  . BREAST ENHANCEMENT SURGERY  1987  . BREAST SURGERY  1974   biopsy  . CARDIAC CATHETERIZATION N/A 11/02/2015   Procedure: Left Heart Cath and Coronary Angiography;  Surgeon: Belva Crome, MD;  Location: [facility];  Service: Cardiovascular;  Laterality: N/A;  . CHOLECYSTECTOMY  1974  . ectopic pregnancy    . OTHER SURGICAL HISTORY     Stomach was not developed at birth    reports that she has never smoked. She has never used smokeless tobacco. She reports that she drinks alcohol. She reports that she does not use drugs. family history includes Arthritis in her mother; Emphysema in her mother; Heart disease (age of onset: 57)  in her father; Heart disease (age of onset: 17) in her mother; Hyperlipidemia in her father and mother; Hypertension in her father and mother; Prostate cancer in her father; Skin cancer in her mother. Allergies  Allergen Reactions  . Erythromycin Base Other (See Comments)    yeast infection, GI upset  . Naproxen Other (See Comments)    GI upset  . Prednisone Hives    REACTION: hives, anxiety, insomnia  . Scallops [Shellfish Allergy] Nausea And Vomiting     Review of Systems  Constitutional: Positive for fatigue. Negative for activity change, appetite change, fever and unexpected weight change.  HENT: Negative for ear pain, hearing loss, sore throat and trouble swallowing.   Eyes: Negative for visual disturbance.  Respiratory: Negative for cough and shortness of breath.   Cardiovascular: Negative for chest pain and palpitations.  Gastrointestinal: Negative for abdominal pain, blood in stool, constipation and diarrhea.  Genitourinary: Negative for dysuria and hematuria.  Musculoskeletal: Positive for arthralgias. Negative for back pain and myalgias.  Skin: Negative for rash.  Neurological: Negative for dizziness, syncope and headaches.  Hematological: Negative for adenopathy.  Psychiatric/Behavioral: Negative for confusion and dysphoric mood.       Objective:   Physical Exam  Constitutional: She is oriented to person, place, and time. She appears well-developed and well-nourished.  HENT:  Head: Normocephalic and atraumatic.  Eyes: Pupils are equal, round, and reactive to light. EOM are normal.  Neck: Normal range of motion. Neck supple. No thyromegaly present.  Cardiovascular: Normal rate,  regular rhythm and normal heart sounds.  No murmur heard. Pulmonary/Chest: Breath sounds normal. No respiratory distress. She has no wheezes. She has no rales.  Abdominal: Soft. Bowel sounds are normal. She exhibits no distension and no mass. There is no tenderness. There is no rebound and no  guarding.  Musculoskeletal: Normal range of motion. She exhibits no edema.  Lymphadenopathy:    She has no cervical adenopathy.  Neurological: She is alert and oriented to person, place, and time. She displays normal reflexes. No cranial nerve deficit.  Skin: No rash noted.  Psychiatric: She has a normal mood and affect. Her behavior is normal. Judgment and thought content normal.       Assessment:     Physical exam. Several health maintenance issues addressed as below    Plan:     -check hepatitis C antibody -Flu vaccine given -Pneumovax given -Check on coverage for new shingles vaccine -Set up physical therapy to help with balance training and fall risk reduction -She will set up repeat mammogram -Consider Medicare wellness visit -consider repeat cologuard by next year  [name] MD [facility] at [facility]

## 2018-04-06 NOTE — Patient Instructions (Signed)
Check on coverage for Shingrix   Set up mammogram  We will set up physical therapy.  I would like for you you to schedule a Medicare Annual Wellness Visit (AWV).   This is a yearly appointment with our Health Coach Wynetta Fines, RN) and is designed to develop a personalized prevention plan. This is not a head to toe physical, but rather an opportunity to prevent illness based on your current health and risk factors for disease.   Visits usually last 30-60 minutes and include various screenings for hearing, vision, depression, and dementia, falls, and safety concerns. The visit also includes diet and exercise counseling and information about advance directives.   This is also an opportunity to discuss appropriate health maintenance testing such as mammography, colonoscopy, lung cancer screening, and hepatitis C testing.   The AWV is fully covered by Medicare Part B if:  . You have had Part B for over 12 months, AND . You have not had an AWV in the past 12 months

## 2018-04-07 ENCOUNTER — Ambulatory Visit: Payer: Self-pay | Admitting: Family Medicine

## 2018-04-07 ENCOUNTER — Other Ambulatory Visit: Payer: Self-pay | Admitting: Family Medicine

## 2018-04-07 DIAGNOSIS — R928 Other abnormal and inconclusive findings on diagnostic imaging of breast: Secondary | ICD-10-CM

## 2018-04-07 NOTE — Telephone Encounter (Signed)
She called in c/o having a terrible headache and "every muscle in my body aches so bad".   I had the flu and pneumonia vaccines yesterday morning during my physical with Dr. Elease Hashimoto.  "I've never had a reaction before to a vaccine".   She is having a little diarrhea and is feeling nauseas.  No sick exposures prior to the injections that she is aware of. I let her know the muscle aches and headache and sore arms are a normal reaction to the vaccines.   However the fact that she said the headache is terrible and she is nauseas and having diarrhea I have routed a note to Dr. Elease Hashimoto making him aware of her situation in case he has something further to advise her.  I went over the home care advise with her also.   I encouraged her to take some Tylenol for the muscle aches as well as the headache.   Try to eat some crackers or bread  Or soup before taking the Tylenol if possible.   She is going to try this.     She is using ice on her right arm because it's the sorest.   "My arms are not really bothering me too bad".   "They are sore but I know that's to be expected".       Reason for Disposition . Pneumococcal vaccine reactions  Answer Assessment - Initial Assessment Questions 1. SYMPTOMS: "What is the main symptom?" (e.g., redness, swelling, pain)      I got the flu shot and pneumonia shot yesterday morning. 2. ONSET: "When was the vaccine (shot) given?" "How much later did the *No Answer*__ begin?" (e.g., hours, days ago)      I started feeling sick a few hours after that. 3. SEVERITY: "How bad is it?"      I have a horrible headache and all my muscles ache, nauseas and having diarrhea.    Not exposed to anyone sick prior to shots. 4. FEVER: "Is there a fever?" If so, ask: "What is it, how was it measured, and when did it start?"      I don't have a thermometer.   I'm having chills and sweats intermittently. 5. IMMUNIZATIONS GIVEN: "What shots have you recently received?"     Flu and  pneumonia. 6. PAST REACTIONS: "Have you reacted to immunizations before?" If so, ask: "What happened?"     No 7. OTHER SYMPTOMS: "Do you have any other symptoms?"     No sore throat or earaches.    My right arm is warm to touch and sore.   Left is sore.  Protocols used: IMMUNIZATION REACTIONS-A-AH

## 2018-04-07 NOTE — Telephone Encounter (Signed)
Difficult to say if all related to the vaccines.  If headache not relieved with the Tylenol will need to be seen.  Follow up for progressive headache or other concerns.

## 2018-04-07 NOTE — Telephone Encounter (Signed)
Please see message.  Please advise. 

## 2018-04-07 NOTE — Telephone Encounter (Signed)
Called patient and left a voice message to see how she is feeling. I will try back in just a few.

## 2018-04-07 NOTE — Telephone Encounter (Signed)
Called patient and asked her how she is feeling. Patient stated that she is feeling a little better but she still has a headache. I gave her the message from Dr. Elease Hashimoto and she stated that she will call us back if she is still having trouble with her headache. Patient is staying hydrated with Gatorade and tea and eating crackers and scrambled egg.

## 2018-04-08 ENCOUNTER — Encounter: Payer: Self-pay | Admitting: Family Medicine

## 2018-04-08 LAB — HEPATITIS C ANTIBODY
Hepatitis C Ab: NONREACTIVE
SIGNAL TO CUT-OFF: 0.01 (ref <1.00)

## 2018-04-09 ENCOUNTER — Ambulatory Visit: Payer: Medicare HMO

## 2018-04-09 ENCOUNTER — Ambulatory Visit
Admission: RE | Admit: 2018-04-09 | Discharge: 2018-04-09 | Disposition: A | Discharge status: Home / Self Care | Payer: Medicare HMO | Source: Ambulatory Visit | Attending: Family Medicine | Admitting: Family Medicine

## 2018-04-09 DIAGNOSIS — R928 Other abnormal and inconclusive findings on diagnostic imaging of breast: Secondary | ICD-10-CM

## 2018-04-12 ENCOUNTER — Encounter: Payer: Self-pay | Admitting: Family Medicine

## 2018-04-12 ENCOUNTER — Other Ambulatory Visit: Payer: Self-pay

## 2018-04-12 ENCOUNTER — Ambulatory Visit (INDEPENDENT_AMBULATORY_CARE_PROVIDER_SITE_OTHER): Payer: Medicare HMO | Admitting: Family Medicine

## 2018-04-12 VITALS — BP 132/84 | HR 66 | Temp 97.7°F | Ht 64.0 in | Wt 179.4 lb

## 2018-04-12 DIAGNOSIS — M150 Primary generalized (osteo)arthritis: Secondary | ICD-10-CM | POA: Diagnosis not present

## 2018-04-12 DIAGNOSIS — M25552 Pain in left hip: Secondary | ICD-10-CM

## 2018-04-12 DIAGNOSIS — M7062 Trochanteric bursitis, left hip: Secondary | ICD-10-CM | POA: Diagnosis not present

## 2018-04-12 DIAGNOSIS — G4700 Insomnia, unspecified: Secondary | ICD-10-CM | POA: Diagnosis not present

## 2018-04-12 DIAGNOSIS — M159 Polyosteoarthritis, unspecified: Secondary | ICD-10-CM

## 2018-04-12 MED ORDER — METHYLPREDNISOLONE ACETATE 40 MG/ML IJ SUSP
40.0000 mg | Freq: Once | INTRAMUSCULAR | Status: AC
  Administered 2018-04-12: 40 mg via INTRA_ARTICULAR

## 2018-04-12 NOTE — Patient Instructions (Addendum)
Insomnia Insomnia is a sleep disorder that makes it difficult to fall asleep or to stay asleep. Insomnia can cause tiredness (fatigue), low energy, difficulty concentrating, mood swings, and poor performance at work or school. There are three different ways to classify insomnia:  Difficulty falling asleep.  Difficulty staying asleep.  Waking up too early in the morning.  Any type of insomnia can be long-term (chronic) or short-term (acute). Both are common. Short-term insomnia usually lasts for three months or less. Chronic insomnia occurs at least three times a week for longer than three months. What are the causes? Insomnia may be caused by another condition, situation, or substance, such as:  Anxiety.  Certain medicines.  Gastroesophageal reflux disease (GERD) or other gastrointestinal conditions.  Asthma or other breathing conditions.  Restless legs syndrome, sleep apnea, or other sleep disorders.  Chronic pain.  Menopause. This may include hot flashes.  Stroke.  Abuse of alcohol, tobacco, or illegal drugs.  Depression.  Caffeine.  Neurological disorders, such as Alzheimer disease.  An overactive thyroid (hyperthyroidism).  The cause of insomnia may not be known. What increases the risk? Risk factors for insomnia include:  Gender. Women are more commonly affected than men.  Age. Insomnia is more common as you get older.  Stress. This may involve your professional or personal life.  Income. Insomnia is more common in people with lower income.  Lack of exercise.  Irregular work schedule or night shifts.  Traveling between different time zones.  What are the signs or symptoms? If you have insomnia, trouble falling asleep or trouble staying asleep is the main symptom. This may lead to other symptoms, such as:  Feeling fatigued.  Feeling nervous about going to sleep.  Not feeling rested in the morning.  Having trouble concentrating.  Feeling  irritable, anxious, or depressed.  How is this treated? Treatment for insomnia depends on the cause. If your insomnia is caused by an underlying condition, treatment will focus on addressing the condition. Treatment may also include:  Medicines to help you sleep.  Counseling or therapy.  Lifestyle adjustments.  Follow these instructions at home:  Take medicines only as directed by your health care provider.  Keep regular sleeping and waking hours. Avoid naps.  Keep a sleep diary to help you and your health care provider figure out what could be causing your insomnia. Include: ? When you sleep. ? When you wake up during the night. ? How well you sleep. ? How rested you feel the next day. ? Any side effects of medicines you are taking. ? What you eat and drink.  Make your bedroom a comfortable place where it is easy to fall asleep: ? Put up shades or special blackout curtains to block light from outside. ? Use a white noise machine to block noise. ? Keep the temperature cool.  Exercise regularly as directed by your health care provider. Avoid exercising right before bedtime.  Use relaxation techniques to manage stress. Ask your health care provider to suggest some techniques that may work well for you. These may include: ? Breathing exercises. ? Routines to release muscle tension. ? Visualizing peaceful scenes.  Cut back on alcohol, caffeinated beverages, and cigarettes, especially close to bedtime. These can disrupt your sleep.  Do not overeat or eat spicy foods right before bedtime. This can lead to digestive discomfort that can make it hard for you to sleep.  Limit screen use before bedtime. This includes: ? Watching TV. ? Using your smartphone, tablet, and   computer.  Stick to a routine. This can help you fall asleep faster. Try to do a quiet activity, brush your teeth, and go to bed at the same time each night.  Get out of bed if you are still awake after 15 minutes  of trying to sleep. Keep the lights down, but try reading or doing a quiet activity. When you feel sleepy, go back to bed.  Make sure that you drive carefully. Avoid driving if you feel very sleepy.  Keep all follow-up appointments as directed by your health care provider. This is important. Contact a health care provider if:  You are tired throughout the day or have trouble in your daily routine due to sleepiness.  You continue to have sleep problems or your sleep problems get worse. Get help right away if:  You have serious thoughts about hurting yourself or someone else. This information is not intended to replace advice given to you by your health care provider. Make sure you discuss any questions you have with your health care provider. Document Released: 07/04/2000 Document Revised: 12/07/2015 Document Reviewed: 04/07/2014 Elsevier Interactive Patient Education  2018 [place]. Trochanteric Bursitis Trochanteric bursitis is a condition that causes hip pain. Trochanteric bursitis happens when fluid-filled sacs (bursae) in the hip get irritated. Normally these sacs absorb shock and help strong bands of tissue (tendons) in your hip glide smoothly over each other and over your hip bones. What are the causes? This condition results from increased friction between the hip bones and the tendons that go over them. This condition can happen if you:  Have weak hips.  Use your hip muscles too much (overuse).  Get hit in the hip.  What increases the risk? This condition is more likely to develop in:  Women.  Adults who are middle-aged or older.  People with arthritis or a spinal condition.  People with weak buttocks muscles (gluteal muscles).  People who have one leg that is shorter than the other.  People who participate in certain kinds of athletic activities, such as: ? Running sports, especially long-distance running. ? Contact sports, like football or martial  arts. ? Sports in which falls may occur, like skiing.  What are the signs or symptoms? The main symptom of this condition is pain and tenderness over the point of your hip. The pain may be:  Sharp and intense.  Dull and achy.  Felt on the outside of your thigh.  It may increase when you:  Lie on your side.  Walk or run.  Go up on stairs.  Sit.  Stand up after sitting.  Stand for long periods of time.  How is this diagnosed? This condition may be diagnosed based on:  Your symptoms.  Your medical history.  A physical exam.  Imaging tests, such as: ? X-rays to check your bones. ? An MRI or ultrasound to check your tendons and muscles.  During your physical exam, your health care provider will check the movement and strength of your hip. He or she may press on the point of your hip to check for pain. How is this treated? This condition may be treated by:  Resting.  Reducing your activity.  Avoiding activities that cause pain.  Using crutches, a cane, or a walker to decrease the strain on your hip.  Taking medicine to help with swelling.  Having medicine injected into the bursae to help with swelling.  Using ice, heat, and massage therapy for pain relief.  Physical therapy exercises  for strength and flexibility.  Surgery (rare).  Follow these instructions at home: Activity  Rest.  Avoid activities that cause pain.  Return to your normal activities as told by your health care provider. Ask your health care provider what activities are safe for you. Managing pain, stiffness, and swelling  Take over-the-counter and prescription medicines only as told by your health care provider.  If directed, apply heat to the injured area as told by your health care provider. ? Place a towel between your skin and the heat source. ? Leave the heat on for 20-30 minutes. ? Remove the heat if your skin turns bright red. This is especially important if you are unable  to feel pain, heat, or cold. You may have a greater risk of getting burned.  If directed, apply ice to the injured area: ? Put ice in a plastic bag. ? Place a towel between your skin and the bag. ? Leave the ice on for 20 minutes, 2-3 times a day. General instructions  If the affected leg is one that you use for driving, ask your health care provider when it is safe to drive.  Use crutches, a cane, or a walker as told by your health care provider.  If one of your legs is shorter than the other, get fitted for a shoe insert.  Lose weight if you are overweight. How is this prevented?  Wear supportive footwear that is appropriate for your sport.  If you have hip pain, start any new exercise or sport slowly.  Maintain physical fitness, including: ? Strength. ? Flexibility. Contact a health care provider if:  Your pain does not improve with 2-4 weeks. Get help right away if:  You develop severe pain.  You have a fever.  You develop increased redness over your hip.  You have a change in your bowel function or bladder function.  You cannot control the muscles in your feet. This information is not intended to replace advice given to you by your health care provider. Make sure you discuss any questions you have with your health care provider. Document Released: 08/14/2004 Document Revised: 03/12/2016 Document Reviewed: 06/22/2015 Elsevier Interactive Patient Education  [employer].

## 2018-04-12 NOTE — Progress Notes (Signed)
Subjective:     Patient ID: Rebecca Reid, female   DOB: 27-Jun-1946, 72 y.o.   MRN: [id number]  HPI Patient here to discuss multiple items:  She's had some issues with insomnia. She has difficulty staying asleep. Usually falls asleep OK but the wakes up for a few hours at night and can't very easily get back to sleep. She does drink sometimes caffeine at dinner in the form of tea. No alcohol use. No daytime napping. She's tried things like melatonin without improvement. Tylenol PM without improvement. Denies depression issues  Second issue she's had some general achiness in general. She especially had some bilateral hip pains left > right. Location is mostly lateral hip and worse early in the morning when she first gets up and also sore when she rolls over in bed. No pain with ambulation.  She has some early morning aches and stiffness in many joints and gradually improves through the day.  She had recent EGD. She had some globus type symptoms. Unremarkable. No stricture.  Past Medical History:  Diagnosis Date  . ALLERGIC RHINITIS CAUSE UNSPECIFIED 04/30/2009  . Anxiety   . DEPRESSION, HX OF 04/11/2010  . Dysuria 05/06/2010  . Gallstones   . GERD 04/30/2009  . HYPERLIPIDEMIA 04/30/2009  . HYPERTENSION 04/30/2009  . LEG EDEMA 12/12/2009  . OSTEOPENIA 04/30/2009  . PVC (premature ventricular contraction)   . Status post dilation of esophageal narrowing    Past Surgical History:  Procedure Laterality Date  . ABDOMINAL HYSTERECTOMY  1986  . AUGMENTATION MAMMAPLASTY    . BREAST ENHANCEMENT SURGERY  1987  . BREAST SURGERY  1974   biopsy  . CARDIAC CATHETERIZATION N/A 11/02/2015   Procedure: Left Heart Cath and Coronary Angiography;  Surgeon: Belva Crome, MD;  Location: [facility];  Service: Cardiovascular;  Laterality: N/A;  . CHOLECYSTECTOMY  1974  . ectopic pregnancy    . OTHER SURGICAL HISTORY     Stomach was not developed at birth    reports that she has never smoked.  She has never used smokeless tobacco. She reports that she drinks alcohol. She reports that she does not use drugs. family history includes Arthritis in her mother; Emphysema in her mother; Heart disease (age of onset: 89) in her father; Heart disease (age of onset: 42) in her mother; Hyperlipidemia in her father and mother; Hypertension in her father and mother; Prostate cancer in her father; Skin cancer in her mother. Allergies  Allergen Reactions  . Erythromycin Base Other (See Comments)    yeast infection, GI upset  . Naproxen Other (See Comments)    GI upset  . Prednisone Hives    REACTION: hives, anxiety, insomnia  . Scallops [Shellfish Allergy] Nausea And Vomiting     Review of Systems  Constitutional: Negative for fatigue.  Eyes: Negative for visual disturbance.  Respiratory: Negative for cough, chest tightness, shortness of breath and wheezing.   Cardiovascular: Negative for chest pain, palpitations and leg swelling.  Musculoskeletal: Positive for arthralgias.  Neurological: Negative for dizziness, seizures, syncope, weakness, light-headedness and headaches.  Psychiatric/Behavioral: Positive for sleep disturbance. Negative for dysphoric mood.       Objective:   Physical Exam  Constitutional: She appears well-developed and well-nourished.  Eyes: Pupils are equal, round, and reactive to light.  Neck: Neck supple. No JVD present. No thyromegaly present.  Cardiovascular: Normal rate and regular rhythm. Exam reveals no gallop.  Pulmonary/Chest: Effort normal and breath sounds normal. No respiratory distress. She has no  wheezes. She has no rales.  Musculoskeletal: She exhibits no edema.  Full range of motion both hips. She has some tenderness left lateral hip region over the bursa.  Neurological: She is alert.       Assessment:     #1 insomnia. Discussed several issues as below  #2 bursitis left hip  #3 primary osteoarthritis multiple sites.    Plan:     -discussed  risk and benefits of steroid injection left hip over the bursa region including risk of bleeding, bruising, infection. Patient consented. Prepped skin over greater trochanteric bursa with Betadine. Using 25-gauge 1 and 1/2 inch needle injected 1 mL of Depo-Medrol 40 mg and 1 mL of plain Xylocaine. Patient tolerated well -Handout given on sleep hygiene and insomnia. Try to avoid regular medications if possible. Avoid caffeine use after about 2 PM -avoid regular use of NSAIDS.  [name] MD [facility]

## 2018-04-14 ENCOUNTER — Encounter: Payer: Self-pay | Admitting: Family Medicine

## 2018-04-15 ENCOUNTER — Ambulatory Visit: Payer: Medicare HMO | Attending: Family Medicine | Admitting: Physical Therapy

## 2018-04-15 ENCOUNTER — Encounter: Payer: Self-pay | Admitting: Physical Therapy

## 2018-04-15 ENCOUNTER — Other Ambulatory Visit: Payer: Self-pay

## 2018-04-15 DIAGNOSIS — M6281 Muscle weakness (generalized): Secondary | ICD-10-CM | POA: Diagnosis not present

## 2018-04-15 DIAGNOSIS — R262 Difficulty in walking, not elsewhere classified: Secondary | ICD-10-CM | POA: Insufficient documentation

## 2018-04-15 DIAGNOSIS — Z9181 History of falling: Secondary | ICD-10-CM | POA: Insufficient documentation

## 2018-04-15 NOTE — Therapy (Signed)
[facility] [address] [place], [place], [address] Phone: [phone]   Fax:  [phone]  Physical Therapy Evaluation  Patient Details  Name: Rebecca Reid MRN: [id number] Date of Birth: 1946-03-16 Referring Provider (PT): [name]   Encounter Date: 04/15/2018  PT End of Session - 04/15/18 1247    Visit Number  1    Date for PT Re-Evaluation  05/27/18    PT Start Time  1147    PT Stop Time  1222    PT Time Calculation (min)  35 min    Activity Tolerance  Patient tolerated treatment well       Past Medical History:  Diagnosis Date  . ALLERGIC RHINITIS CAUSE UNSPECIFIED 04/30/2009  . Anxiety   . DEPRESSION, HX OF 04/11/2010  . Dysuria 05/06/2010  . Gallstones   . GERD 04/30/2009  . HYPERLIPIDEMIA 04/30/2009  . HYPERTENSION 04/30/2009  . LEG EDEMA 12/12/2009  . OSTEOPENIA 04/30/2009  . PVC (premature ventricular contraction)   . Status post dilation of esophageal narrowing     Past Surgical History:  Procedure Laterality Date  . ABDOMINAL HYSTERECTOMY  1986  . AUGMENTATION MAMMAPLASTY    . BREAST ENHANCEMENT SURGERY  1987  . BREAST SURGERY  1974   biopsy  . CARDIAC CATHETERIZATION N/A 11/02/2015   Procedure: Left Heart Cath and Coronary Angiography;  Surgeon: Belva Crome, MD;  Location: [facility];  Service: Cardiovascular;  Laterality: N/A;  . CHOLECYSTECTOMY  1974  . ectopic pregnancy    . OTHER SURGICAL HISTORY     Stomach was not developed at birth    There were no vitals filed for this visit.   Subjective Assessment - 04/15/18 1147    Subjective  Pt states she has been less steady and usually holds husbands hand or holds the wall.  Pt is active all day.      Patient Stated Goals  be more steady    Currently in Pain?  Yes    Pain Score  5     Pain Location  Back    Pain Orientation  Left;Lower    Pain Descriptors / Indicators  Aching    Pain Radiating Towards   sometimes travels but is not today    Pain Onset  More than a month ago    Pain Frequency  Intermittent    Multiple Pain Sites  No         [facility] PT Assessment - 04/15/18 0001      Assessment   Medical Diagnosis  Z91.81 (ICD-10-CM) - At risk for falls    Referring Provider (PT)  [name]    Prior Therapy  No not this episode      Precautions   Precautions  None      Restrictions   Weight Bearing Restrictions  No      Balance Screen   Has the patient fallen in the past 6 months  Yes    How many times?  1    Has the patient had a decrease in activity level because of a fear of falling?   No    Is the patient reluctant to leave their home because of a fear of falling?   No      Home Social worker  Private residence    Living Arrangements  Spouse/significant other      Prior Function   Level of [place]  Leisure  shopping and home care, yard work      Cognition   Overall Cognitive Status  Within Functional Limits for tasks assessed      Observation/Other Assessments   Focus on Therapeutic Outcomes (FOTO)   BERG and DGI balance assessments       Posture/Postural Control   Posture/Postural Control  Postural limitations    Postural Limitations  Rounded Shoulders;Increased thoracic kyphosis      ROM / Strength   AROM / PROM / Strength  Strength      Strength   Overall Strength Comments  hip abductor weakness demonstrated in gait      Transfers   Five time sit to stand comments   19 sed - needs 1 UE support on left (leaning to left side)   able to perform sit to stand without UE with verbal cues     Ambulation/Gait   Gait Pattern  Trendelenburg;Scissoring;Lateral trunk lean to left   occasional scissoring usually with LOB to the left     Standardized Balance Assessment   Standardized Balance Assessment  Timed Up and Go Test;Berg Balance Test;Dynamic Gait Index;Five Times Sit to Stand    Five times sit to stand comments    19 sec      Berg Balance Test   Sit to Stand  Able to stand without using hands and stabilize independently    Standing Unsupported  Able to stand safely 2 minutes    Sitting with Back Unsupported but Feet Supported on Floor or Stool  Able to sit safely and securely 2 minutes    Stand to Sit  Sits safely with minimal use of hands    Transfers  Able to transfer safely, minor use of hands    Standing Unsupported with Eyes Closed  Able to stand 10 seconds with supervision    Standing Ubsupported with Feet Together  Able to place feet together independently and stand 1 minute safely    From Standing, Reach Forward with Outstretched Arm  Can reach confidently >25 cm (10")    From Standing Position, Pick up Object from Floor  Able to pick up shoe safely and easily    From Standing Position, Turn to Look Behind Over each Shoulder  Looks behind from both sides and weight shifts well    Turn 360 Degrees  Able to turn 360 degrees safely but slowly    Standing Unsupported, Alternately Place Feet on Step/Stool  Able to stand independently and complete 8 steps >20 seconds    Standing Unsupported, One Foot in Front  Able to plae foot ahead of the other independently and hold 30 seconds    Standing on One Leg  Tries to lift leg/unable to hold 3 seconds but remains standing independently   Rt LE 1 sec; Lt 4 sec   Total Score  48    Berg comment:  fall risk      Dynamic Gait Index   Level Surface  Mild Impairment    Change in Gait Speed  Mild Impairment   not much change in speed   Gait with Horizontal Head Turns  Mild Impairment    Gait with Vertical Head Turns  Moderate Impairment    Gait and Pivot Turn  Normal    Step Over Obstacle  Mild Impairment   slow down   Step Around Obstacles  Mild Impairment   slight LOB around obstacle   Steps  Mild Impairment   BUE support on rail  Total Score  16    DGI comment:  19 or less indicates risk for falls                Objective  measurements completed on examination: See above findings.      [facility] PT Treatment/Exercise - 04/15/18 0001      Self-Care   Self-Care  Other Self-Care Comments    Other Self-Care Comments   educated and performed initial HEP  Access Code: [email or web address]              PT Education - 04/15/18 1226    Education Details   Access Code: [email or web address]     Person(s) Educated  Patient    Methods  Explanation;Demonstration;Handout    Comprehension  Verbalized understanding;Returned demonstration          PT Long Term Goals - 04/15/18 1436      PT LONG TERM GOAL #1   Title  Pt will demonstrate >19/24 on DGI to demonstrate reduced risk of falls    Baseline  16/24    Time  6    Period  Weeks    Status  New    Target Date  05/27/18      PT LONG TERM GOAL #2   Title  Pt will demonstrate >51/56 on Berg balance assessment    Time  6    Period  Weeks    Status  New    Target Date  05/27/18      PT LONG TERM GOAL #3   Title  Pt will report feeling 50% more stability when walking    Time  6    Period  Weeks    Status  New    Target Date  05/27/18      PT LONG TERM GOAL #4   Title  Pt will be able to perform 5 x sit to stand in <18 sec without using UE support due to improved strength and stability.    Time  6    Period  Weeks    Status  New    Target Date  05/27/18             Plan - 04/15/18 1438    Clinical Impression Statement  Pt presents to PT with some hip and back pain but that has mostly resolved with injections.  Primarily she is here due to decreased balance and has been falling more.  She is afraid to fall because she became unconscious when falling in January and it took a long time to recover.  Pt demonstrates gait and posture abnormalities as mentioned above.  Lateral weight shift suggests hip and gluteal weaknesses in LE.  Pt demonstrates increased fall risk based on all balance assessments mentioned above; DGI, Berg, five x sit to stand.  Pt will benefit  from skilled PT to address impairments for safe return to activities.    History and Personal Factors relevant to plan of care:  concussion when fell this past January    Clinical Presentation  Evolving    Clinical Presentation due to:  pt falling more frequently    Clinical Decision Making  Low    Rehab Potential  Excellent    PT Frequency  1x / week    PT Duration  6 weeks    PT Treatment/Interventions  ADLs/Self Care Home Management;Cryotherapy;Electrical Stimulation;Moist Heat;Gait training;Stair training;Functional mobility training;Therapeutic activities;Therapeutic exercise;Balance training;Neuromuscular re-education;Patient/family education;Manual techniques;Passive range of motion;Taping;Dry needling    PT Next Visit Plan  progress HEP, balance and LE strength    PT Home Exercise Plan  Access Code: [email or web address]     Recommended Other Services  eval 9/26    Consulted and Agree with Plan of Care  Patient       Patient will benefit from skilled therapeutic intervention in order to improve the following deficits and impairments:  Abnormal gait, Pain, Postural dysfunction, Decreased strength, Decreased balance  Visit Diagnosis: Difficulty in walking, not elsewhere classified  History of falling  Muscle weakness (generalized)     Problem List Patient Active Problem List   Diagnosis Date Noted  . Normal coronary arteries 11/19/2015  . Palpitations 11/19/2015  . Abnormal nuclear stress test 11/02/2015  . Right sided abdominal pain 12/14/2013  . Flank pain 12/14/2013  . Pancreatic cyst 12/14/2013  . History of diverticulitis 08/15/2013  . Lumbosacral radiculopathy at S1 06/06/2013  . DYSURIA 05/06/2010  . DEPRESSION, HX OF 04/11/2010  . LEG EDEMA 12/12/2009  . CONTUSION, LEG 08/13/2009  . Dyslipidemia 04/30/2009  . Essential hypertension 04/30/2009  . ALLERGIC RHINITIS CAUSE UNSPECIFIED 04/30/2009  . GERD 04/30/2009  . OSTEOPENIA 04/30/2009    Zannie Cove,  PT 04/15/2018, 2:45 PM  [facility] [address], [place], [place], [address] Phone: [phone]   Fax:  [phone]  Name: Rebecca Reid MRN: [id number] Date of Birth: 12/06/1945

## 2018-04-15 NOTE — Patient Instructions (Signed)
Access Code: [email or web address]  URL: [email or web address]  Date: 04/15/2018  Prepared by: Lovett Calender   Exercises  Tandem Stance with Head Rotation - 10 reps - 3 sets - 1x daily - 7x weekly  Forward Backward Weight Shift with Counter Support - 10 reps - 3 sets - 1x daily - 7x weekly

## 2018-04-18 NOTE — Progress Notes (Addendum)
Subjective:   Rebecca Reid is a 72 y.o. female who presents for Medicare Annual (Subsequent) preventive examination.  Reports health as Bad fall in January- [facility] and [facility] out   Hobby: Loves to make wreaths   Diet BMI 29   Chol/hdl 3;  Weight is the same  3 meals mostly  Or 2 if late breakfast  Cook, eggs, dry toast - 10:30 or 11am Lunch [place] sandwich Supper; no processed food   Exercise Was exercising until her fall  Yoga and stretch [facility] [place] Having balance issues and seeing PT  Gets on hands and knees to clean 3 days exercise general housekeeping  Does exercises at home   There are no preventive care reminders to display for this patient. Did colo-guard 03/2016 and will repeat 03/2019  Mammogram 03/2018 Dexa 05/2012  -1.7- may repeat next year     Cardiac Risk Factors include: advanced age (>3men, >60 women);hypertension    Mother had melanoma; Dr. Elease Hashimoto does her skin checks     Objective:     Vitals: BP 126/80   Pulse 73   Ht 5\' 5"  (1.651 m)   Wt 177 lb (80.3 kg)   SpO2 97%   BMI 29.45 kg/m   Body mass index is 29.45 kg/m.  Advanced Directives 04/19/2018 04/15/2018 11/02/2015  Does Patient Have a Medical Advance Directive? Yes Yes Yes  Type of Advance Directive - [place];Living will Living will;Healthcare Power of Attorney  Does patient want to make changes to medical advance directive? - - No - Patient declined  Copy of [place] in Chart? - No - copy requested No - copy requested    Tobacco Social History   Tobacco Use  Smoking Status Never Smoker  Smokeless Tobacco Never Used     Counseling given: Yes   Clinical Intake:     Past Medical History:  Diagnosis Date  . ALLERGIC RHINITIS CAUSE UNSPECIFIED 04/30/2009  . Anxiety   . DEPRESSION, HX OF 04/11/2010  . Dysuria 05/06/2010  . Gallstones   . GERD 04/30/2009   . HYPERLIPIDEMIA 04/30/2009  . HYPERTENSION 04/30/2009  . LEG EDEMA 12/12/2009  . OSTEOPENIA 04/30/2009  . PVC (premature ventricular contraction)   . Status post dilation of esophageal narrowing    Past Surgical History:  Procedure Laterality Date  . ABDOMINAL HYSTERECTOMY  1986  . AUGMENTATION MAMMAPLASTY    . BREAST ENHANCEMENT SURGERY  1987  . BREAST SURGERY  1974   biopsy  . CARDIAC CATHETERIZATION N/A 11/02/2015   Procedure: Left Heart Cath and Coronary Angiography;  Surgeon: Belva Crome, MD;  Location: [facility];  Service: Cardiovascular;  Laterality: N/A;  . CATARACT EXTRACTION, BILATERAL Bilateral    one in feb and one in March Dr. Tommy Rainwater  . CHOLECYSTECTOMY  1974  . ectopic pregnancy    . OTHER SURGICAL HISTORY     Stomach was not developed at birth   Family History  Problem Relation Age of Onset  . Emphysema Mother        heavy smoker  . Heart disease Mother 57  . Hyperlipidemia Mother   . Hypertension Mother   . Skin cancer Mother        melanomia, spread to her spine  . Arthritis Mother   . Heart disease Father 55  . Prostate cancer Father   . Hyperlipidemia Father   . Hypertension Father   .  Colon cancer Neg Hx   . Esophageal cancer Neg Hx   . Rectal cancer Neg Hx   . Breast cancer Neg Hx    Social History   Socioeconomic History  . Marital status: Married    Spouse name: Not on file  . Number of children: 3  . Years of education: Not on file  . Highest education level: Not on file  Occupational History  . Occupation: retired  Scientific laboratory technician  . Financial resource strain: Not on file  . Food insecurity:    Worry: Not on file    Inability: Not on file  . Transportation needs:    Medical: Not on file    Non-medical: Not on file  Tobacco Use  . Smoking status: Never Smoker  . Smokeless tobacco: Never Used  Substance and Sexual Activity  . Alcohol use: Yes    Comment: social  . Drug use: Never  . Sexual activity: Not on file    Lifestyle  . Physical activity:    Days per week: Not on file    Minutes per session: Not on file  . Stress: Not on file  Relationships  . Social connections:    Talks on phone: Not on file    Gets together: Not on file    Attends religious service: Not on file    Active member of club or organization: Not on file    Attends meetings of clubs or organizations: Not on file    Relationship status: Not on file  Other Topics Concern  . Not on file  Social History Narrative  . Not on file    Outpatient Encounter Medications as of 04/19/2018  Medication Sig  . aspirin 81 MG tablet Take 81 mg by mouth daily.    . cetirizine (ZYRTEC) 5 MG tablet Take 5 mg by mouth as needed for allergies.  . cholecalciferol (VITAMIN D) 1000 units tablet Take 1,000 Units by mouth daily.  . diclofenac sodium (VOLTAREN) 1 % GEL Apply 2 g topically 4 (four) times daily.  Marland Kitchen ibuprofen (ADVIL,MOTRIN) 200 MG tablet Take 200-400 mg by mouth daily as needed for moderate pain.  Marland Kitchen losartan-hydrochlorothiazide (HYZAAR) 100-25 MG tablet Take 0.5 tablets by mouth daily.  . metoprolol succinate (TOPROL-XL) 50 MG 24 hr tablet TAKE ONE (1) TABLET BY MOUTH EVERY DAY WITH OR IMMEDIATELY FOLLOWING A MEAL  . metroNIDAZOLE (METROGEL) 0.75 % gel Apply 1 application topically 2 (two) times daily.  Marland Kitchen omeprazole (PRILOSEC) 40 MG capsule Take 40 mg by mouth daily.  . sertraline (ZOLOFT) 50 MG tablet TAKE ONE (1) TABLET BY MOUTH EVERY DAY   Facility-Administered Encounter Medications as of 04/19/2018  Medication  . 0.9 %  sodium chloride infusion    Activities of Daily Living In your present state of health, do you have any difficulty performing the following activities: 04/19/2018 04/06/2018  Hearing? - N  Vision? - N  Difficulty concentrating or making decisions? - N  Walking or climbing stairs? - N  Dressing or bathing? - N  Doing errands, shopping? - N  Conservation officer, nature and eating ? N -  Using the Toilet? N -  In the past  six months, have you accidently leaked urine? N -  Do you have problems with loss of bowel control? N -  Managing your Medications? N -  Managing your Finances? N -  Housekeeping or managing your Housekeeping? N -  Some recent data might be hidden    Patient Care Team: Burchette,  Alinda Sierras, MD as PCP - General    Assessment:   This is a routine wellness examination for Rebecca Reid.  Exercise Activities and Dietary recommendations Current Exercise Habits: Home exercise routine, Type of exercise: walking, Time (Minutes): 30, Frequency (Times/Week): 3(cleans her home), Weekly Exercise (Minutes/Week): 90, Intensity: Moderate  Goals    . Exercise 150 min/wk Moderate Activity     Find the new exercise that you enjoy with people or by yourself Weight; yoga and stretches        Fall Risk Fall Risk  04/06/2018 12/12/2016 03/25/2016 09/12/2015 07/10/2014  Falls in the past year? Yes Yes No No No  Number falls in past yr: 2 or more 2 or more - - -  Injury with Fall? Yes Yes - - -  Comment Minor concussion, 07/2017 - - - -     Depression Screen PHQ 2/9 Scores 04/19/2018 04/06/2018 03/25/2016 09/12/2015  PHQ - 2 Score 0 0 0 0  PHQ- 9 Score - 1 - -     Cognitive Function MMSE - Mini Mental State Exam 04/19/2018  Not completed: (No Data)     Ad8 score reviewed for issues:  Issues making decisions:  Less interest in hobbies / activities:  Repeats questions, stories (family complaining):  Trouble using ordinary gadgets (microwave, computer, phone):  Forgets the month or year:   Mismanaging finances:   Remembering appts:  Daily problems with thinking and/or memory: Ad8 score is=0  Long discussion of memory       Immunization History  Administered Date(s) Administered  . Influenza Split 05/16/2011, 03/31/2012  . Influenza Whole 04/11/2010  . Influenza, High Dose Seasonal PF 03/25/2016, 03/24/2017, 04/06/2018  . Influenza,inj,Quad PF,6+ Mos 03/31/2013, 04/16/2015  .  Influenza-Unspecified 05/04/2014  . Pneumococcal Conjugate-13 03/31/2012, 03/25/2016  . Pneumococcal Polysaccharide-23 04/06/2018  . Td 04/20/1989, 04/30/2009  . Zoster 11/04/2012      Screening Tests Health Maintenance  Topic Date Due  . Fecal DNA (Cologuard)  04/08/2019  . TETANUS/TDAP  05/01/2019  . MAMMOGRAM  04/09/2020  . INFLUENZA VACCINE  Completed  . DEXA SCAN  Completed  . Hepatitis C Screening  Completed  . PNA vac Low Risk Adult  Completed        Plan:      PCP Notes   Health Maintenance Did colo-guard 03/2016 and will repeat 03/2019  Mammogram 03/2018 Dexa 05/2012  -1.7- may repeat next year    Abnormal Screens  none  Referrals  none  Patient concerns; Balance and starting to exercise again.  Set a goal to start back at gym  States hip is much better since her injection  Nurse Concerns; As noted   Next PCP apt was seen 04/12/2018       I have personally reviewed and noted the following in the patient's chart:   . Medical and social history . Use of alcohol, tobacco or illicit drugs  . Current medications and supplements . Functional ability and status . Nutritional status . Physical activity . Advanced directives . List of other physicians . Hospitalizations, surgeries, and ER visits in previous 12 months . Vitals . Screenings to include cognitive, depression, and falls . Referrals and appointments  In addition, I have reviewed and discussed with patient certain preventive protocols, quality metrics, and best practice recommendations. A written personalized care plan for preventive services as well as general preventive health recommendations were provided to patient.     BJSEG,BTDVV, RN  04/19/2018  I have reviewed the documentation for  the AWV and Advanced Care Planning provided by the health coach and agree with their documentation. I was immediately available for any questions  [name] MD [facility] at  [facility]

## 2018-04-19 ENCOUNTER — Ambulatory Visit: Payer: Medicare HMO | Admitting: Physical Therapy

## 2018-04-19 ENCOUNTER — Encounter: Payer: Self-pay | Admitting: Physical Therapy

## 2018-04-19 ENCOUNTER — Ambulatory Visit (INDEPENDENT_AMBULATORY_CARE_PROVIDER_SITE_OTHER): Payer: Medicare HMO

## 2018-04-19 VITALS — BP 126/80 | HR 73 | Ht 65.0 in | Wt 177.0 lb

## 2018-04-19 DIAGNOSIS — Z0000 Encounter for general adult medical examination without abnormal findings: Secondary | ICD-10-CM

## 2018-04-19 DIAGNOSIS — Z9181 History of falling: Secondary | ICD-10-CM | POA: Diagnosis not present

## 2018-04-19 DIAGNOSIS — R262 Difficulty in walking, not elsewhere classified: Secondary | ICD-10-CM

## 2018-04-19 DIAGNOSIS — M6281 Muscle weakness (generalized): Secondary | ICD-10-CM | POA: Diagnosis not present

## 2018-04-19 NOTE — Patient Instructions (Addendum)
Rebecca Reid , Thank you for taking time to come for your Medicare Wellness Visit. I appreciate your ongoing commitment to your health goals. Please review the following plan we discussed and let me know if I can assist you in the future.   Shingrix is a vaccine for the prevention of Shingles in Adults 50 and older.  If you are on Medicare, the shingrix is covered under your Part D plan, so you will take both of the vaccines in the series at your pharmacy. Please check with your benefits regarding applicable copays or out of pocket expenses.  The Shingrix is given in 2 vaccines approx 8 weeks apart. You must receive the 2nd dose prior to 6 months from receipt of the first. Please have the pharmacist print out you Immunization  dates for our office records   We need a copy of your HCPOA... And Living will   Your bone density was -1.7 You may want to consider having a bone density when you have your mammogram next year   Recommendations for Dexa Scan Female over the age of 65 Man age 11 or older If you broke a bone past the age of 12 Women menopausal age with risk factors (thin frame; smoker; hx of fx ) Post menopausal women under the age of 43 with risk factors A man age 44 to 53 with risk factors Other: Spine xray that is showing break of bone loss Back pain with possible break Height loss of 1/2 inch or more within one year Total loss in height of 1.5 inches from your original height  Calcium 1262m with Vit D 800u per day; more as directed by physician Strength building exercises discussed; can include walking; housework; small weights or stretch bands; silver sneakers if [facility] to the [facility]  Please visit the osteoporosis foundation.org for up to date recommendations    These are the goals we discussed: Goals    . Exercise 150 min/wk Moderate Activity     Find the new exercise that you enjoy with people or by yourself Weight; yoga and stretches        This is a list of the  screening recommended for you and due dates:  Health Maintenance  Topic Date Due  . Colon Cancer Screening  02/06/1997  . Tetanus Vaccine  05/01/2019  . Mammogram  04/09/2020  . Flu Shot  Completed  . DEXA scan (bone density measurement)  Completed  .  Hepatitis C: One time screening is recommended by Center for Disease Control  (CDC) for  adults born from [birth year]through 1964.   Completed  . Pneumonia vaccines  Completed      Fall Prevention in the Home Falls can cause injuries. They can happen to people of all ages. There are many things you can do to make your home safe and to help prevent falls. What can I do on the outside of my home?  Regularly fix the edges of walkways and driveways and fix any cracks.  Remove anything that might make you trip as you walk through a door, such as a raised step or threshold.  Trim any bushes or trees on the path to your home.  Use bright outdoor lighting.  Clear any walking paths of anything that might make someone trip, such as rocks or tools.  Regularly check to see if handrails are loose or broken. Make sure that both sides of any steps have handrails.  Any raised decks and porches should have guardrails on  the edges.  Have any leaves, snow, or ice cleared regularly.  Use sand or salt on walking paths during winter.  Clean up any spills in your garage right away. This includes oil or grease spills. What can I do in the bathroom?  Use night lights.  Install grab bars by the toilet and in the tub and shower. Do not use towel bars as grab bars.  Use non-skid mats or decals in the tub or shower.  If you need to sit down in the shower, use a plastic, non-slip stool.  Keep the floor dry. Clean up any water that spills on the floor as soon as it happens.  Remove soap buildup in the tub or shower regularly.  Attach bath mats securely with double-sided non-slip rug tape.  Do not have throw rugs and other things on the floor that can  make you trip. What can I do in the bedroom?  Use night lights.  Make sure that you have a light by your bed that is easy to reach.  Do not use any sheets or blankets that are too big for your bed. They should not hang down onto the floor.  Have a firm chair that has side arms. You can use this for support while you get dressed.  Do not have throw rugs and other things on the floor that can make you trip. What can I do in the kitchen?  Clean up any spills right away.  Avoid walking on wet floors.  Keep items that you use a lot in easy-to-reach places.  If you need to reach something above you, use a strong step stool that has a grab bar.  Keep electrical cords out of the way.  Do not use floor polish or wax that makes floors slippery. If you must use wax, use non-skid floor wax.  Do not have throw rugs and other things on the floor that can make you trip. What can I do with my stairs?  Do not leave any items on the stairs.  Make sure that there are handrails on both sides of the stairs and use them. Fix handrails that are broken or loose. Make sure that handrails are as long as the stairways.  Check any carpeting to make sure that it is firmly attached to the stairs. Fix any carpet that is loose or worn.  Avoid having throw rugs at the top or bottom of the stairs. If you do have throw rugs, attach them to the floor with carpet tape.  Make sure that you have a light switch at the top of the stairs and the bottom of the stairs. If you do not have them, ask someone to add them for you. What else can I do to help prevent falls?  Wear shoes that: ? Do not have high heels. ? Have rubber bottoms. ? Are comfortable and fit you well. ? Are closed at the toe. Do not wear sandals.  If you use a stepladder: ? Make sure that it is fully opened. Do not climb a closed stepladder. ? Make sure that both sides of the stepladder are locked into place. ? Ask someone to hold it for you,  if possible.  Clearly mark and make sure that you can see: ? Any grab bars or handrails. ? First and last steps. ? Where the edge of each step is.  Use tools that help you move around (mobility aids) if they are needed. These include: ? Canes. ? Walkers. ?  Scooters. ? Crutches.  Turn on the lights when you go into a dark area. Replace any light bulbs as soon as they burn out.  Set up your furniture so you have a clear path. Avoid moving your furniture around.  If any of your floors are uneven, fix them.  If there are any pets around you, be aware of where they are.  Review your medicines with your doctor. Some medicines can make you feel dizzy. This can increase your chance of falling. Ask your doctor what other things that you can do to help prevent falls. This information is not intended to replace advice given to you by your health care provider. Make sure you discuss any questions you have with your health care provider. Document Released: 05/03/2009 Document Revised: 12/13/2015 Document Reviewed: 08/11/2014 Elsevier Interactive Patient Education  2018 [place] Maintenance, Female Adopting a healthy lifestyle and getting preventive care can go a long way to promote health and wellness. Talk with your health care provider about what schedule of regular examinations is right for you. This is a good chance for you to check in with your provider about disease prevention and staying healthy. In between checkups, there are plenty of things you can do on your own. Experts have done a lot of research about which lifestyle changes and preventive measures are most likely to keep you healthy. Ask your health care provider for more information. Weight and diet Eat a healthy diet  Be sure to include plenty of vegetables, fruits, low-fat dairy products, and lean protein.  Do not eat a lot of foods high in solid fats, added sugars, or salt.  Get regular exercise. This is one  of the most important things you can do for your health. ? Most adults should exercise for at least 150 minutes each week. The exercise should increase your heart rate and make you sweat (moderate-intensity exercise). ? Most adults should also do strengthening exercises at least twice a week. This is in addition to the moderate-intensity exercise.  Maintain a healthy weight  Body mass index (BMI) is a measurement that can be used to identify possible weight problems. It estimates body fat based on height and weight. Your health care provider can help determine your BMI and help you achieve or maintain a healthy weight.  For females 91 years of age and older: ? A BMI below 18.5 is considered underweight. ? A BMI of 18.5 to 24.9 is normal. ? A BMI of 25 to 29.9 is considered overweight. ? A BMI of 30 and above is considered obese.  Watch levels of cholesterol and blood lipids  You should start having your blood tested for lipids and cholesterol at 71 years of age, then have this test every 5 years.  You may need to have your cholesterol levels checked more often if: ? Your lipid or cholesterol levels are high. ? You are older than 71 years of age. ? You are at high risk for heart disease.  Cancer screening Lung Cancer  Lung cancer screening is recommended for adults 59-7 years old who are at high risk for lung cancer because of a history of smoking.  A yearly low-dose CT scan of the lungs is recommended for people who: ? Currently smoke. ? Have quit within the past 15 years. ? Have at least a 30-pack-year history of smoking. A pack year is smoking an average of one pack of cigarettes a day for 1 year.  Yearly screening should  continue until it has been 15 years since you quit.  Yearly screening should stop if you develop a health problem that would prevent you from having lung cancer treatment.  Breast Cancer  Practice breast self-awareness. This means understanding how your  breasts normally appear and feel.  It also means doing regular breast self-exams. Let your health care provider know about any changes, no matter how small.  If you are in your 20s or 30s, you should have a clinical breast exam (CBE) by a health care provider every 1-3 years as part of a regular health exam.  If you are 9 or older, have a CBE every year. Also consider having a breast X-ray (mammogram) every year.  If you have a family history of breast cancer, talk to your health care provider about genetic screening.  If you are at high risk for breast cancer, talk to your health care provider about having an MRI and a mammogram every year.  Breast cancer gene (BRCA) assessment is recommended for women who have family members with BRCA-related cancers. BRCA-related cancers include: ? Breast. ? Ovarian. ? Tubal. ? Peritoneal cancers.  Results of the assessment will determine the need for genetic counseling and BRCA1 and BRCA2 testing.  Cervical Cancer Your health care provider may recommend that you be screened regularly for cancer of the pelvic organs (ovaries, uterus, and vagina). This screening involves a pelvic examination, including checking for microscopic changes to the surface of your cervix (Pap test). You may be encouraged to have this screening done every 3 years, beginning at age 37.  For women ages 74-65, health care providers may recommend pelvic exams and Pap testing every 3 years, or they may recommend the Pap and pelvic exam, combined with testing for human papilloma virus (HPV), every 5 years. Some types of HPV increase your risk of cervical cancer. Testing for HPV may also be done on women of any age with unclear Pap test results.  Other health care providers may not recommend any screening for nonpregnant women who are considered low risk for pelvic cancer and who do not have symptoms. Ask your health care provider if a screening pelvic exam is right for you.  If you  have had past treatment for cervical cancer or a condition that could lead to cancer, you need Pap tests and screening for cancer for at least 20 years after your treatment. If Pap tests have been discontinued, your risk factors (such as having a new sexual partner) need to be reassessed to determine if screening should resume. Some women have medical problems that increase the chance of getting cervical cancer. In these cases, your health care provider may recommend more frequent screening and Pap tests.  Colorectal Cancer  This type of cancer can be detected and often prevented.  Routine colorectal cancer screening usually begins at 71 years of age and continues through 71 years of age.  Your health care provider may recommend screening at an earlier age if you have risk factors for colon cancer.  Your health care provider may also recommend using home test kits to check for hidden blood in the stool.  A small camera at the end of a tube can be used to examine your colon directly (sigmoidoscopy or colonoscopy). This is done to check for the earliest forms of colorectal cancer.  Routine screening usually begins at age 53.  Direct examination of the colon should be repeated every 5-10 years through 71 years of age. However, you may  need to be screened more often if early forms of precancerous polyps or small growths are found.  Skin Cancer  Check your skin from head to toe regularly.  Tell your health care provider about any new moles or changes in moles, especially if there is a change in a mole's shape or color.  Also tell your health care provider if you have a mole that is larger than the size of a pencil eraser.  Always use sunscreen. Apply sunscreen liberally and repeatedly throughout the day.  Protect yourself by wearing long sleeves, pants, a wide-brimmed hat, and sunglasses whenever you are outside.  Heart disease, diabetes, and high blood pressure  High blood pressure causes  heart disease and increases the risk of stroke. High blood pressure is more likely to develop in: ? People who have blood pressure in the high end of the normal range (130-139/85-89 mm Hg). ? People who are overweight or obese. ? People who are African American.  If you are 64-65 years of age, have your blood pressure checked every 3-5 years. If you are 79 years of age or older, have your blood pressure checked every year. You should have your blood pressure measured twice-once when you are at a hospital or clinic, and once when you are not at a hospital or clinic. Record the average of the two measurements. To check your blood pressure when you are not at a hospital or clinic, you can use: ? An automated blood pressure machine at a pharmacy. ? A home blood pressure monitor.  If you are between 62 years and 40 years old, ask your health care provider if you should take aspirin to prevent strokes.  Have regular diabetes screenings. This involves taking a blood sample to check your fasting blood sugar level. ? If you are at a normal weight and have a low risk for diabetes, have this test once every three years after 71 years of age. ? If you are overweight and have a high risk for diabetes, consider being tested at a younger age or more often. Preventing infection Hepatitis B  If you have a higher risk for hepatitis B, you should be screened for this virus. You are considered at high risk for hepatitis B if: ? You were born in a country where hepatitis B is common. Ask your health care provider which countries are considered high risk. ? Your parents were born in a high-risk country, and you have not been immunized against hepatitis B (hepatitis B vaccine). ? You have HIV or AIDS. ? You use needles to inject street drugs. ? You live with someone who has hepatitis B. ? You have had sex with someone who has hepatitis B. ? You get hemodialysis treatment. ? You take certain medicines for  conditions, including cancer, organ transplantation, and autoimmune conditions.  Hepatitis C  Blood testing is recommended for: ? Everyone born from [birth year] through 1964. ? Anyone with known risk factors for hepatitis C.  Sexually transmitted infections (STIs)  You should be screened for sexually transmitted infections (STIs) including gonorrhea and chlamydia if: ? You are sexually active and are younger than 71 years of age. ? You are older than 71 years of age and your health care provider tells you that you are at risk for this type of infection. ? Your sexual activity has changed since you were last screened and you are at an increased risk for chlamydia or gonorrhea. Ask your health care provider if you are at  risk.  If you do not have HIV, but are at risk, it may be recommended that you take a prescription medicine daily to prevent HIV infection. This is called pre-exposure prophylaxis (PrEP). You are considered at risk if: ? You are sexually active and do not regularly use condoms or know the HIV status of your partner(s). ? You take drugs by injection. ? You are sexually active with a partner who has HIV.  Talk with your health care provider about whether you are at high risk of being infected with HIV. If you choose to begin PrEP, you should first be tested for HIV. You should then be tested every 3 months for as long as you are taking PrEP. Pregnancy  If you are premenopausal and you may become pregnant, ask your health care provider about preconception counseling.  If you may become pregnant, take 400 to 800 micrograms (mcg) of folic acid every day.  If you want to prevent pregnancy, talk to your health care provider about birth control (contraception). Osteoporosis and menopause  Osteoporosis is a disease in which the bones lose minerals and strength with aging. This can result in serious bone fractures. Your risk for osteoporosis can be identified using a bone density  scan.  If you are 61 years of age or older, or if you are at risk for osteoporosis and fractures, ask your health care provider if you should be screened.  Ask your health care provider whether you should take a calcium or vitamin D supplement to lower your risk for osteoporosis.  Menopause may have certain physical symptoms and risks.  Hormone replacement therapy may reduce some of these symptoms and risks. Talk to your health care provider about whether hormone replacement therapy is right for you. Follow these instructions at home:  Schedule regular health, dental, and eye exams.  Stay current with your immunizations.  Do not use any tobacco products including cigarettes, chewing tobacco, or electronic cigarettes.  If you are pregnant, do not drink alcohol.  If you are breastfeeding, limit how much and how often you drink alcohol.  Limit alcohol intake to no more than 1 drink per day for nonpregnant women. One drink equals 12 ounces of beer, 5 ounces of wine, or 1 ounces of hard liquor.  Do not use street drugs.  Do not share needles.  Ask your health care provider for help if you need support or information about quitting drugs.  Tell your health care provider if you often feel depressed.  Tell your health care provider if you have ever been abused or do not feel safe at home. This information is not intended to replace advice given to you by your health care provider. Make sure you discuss any questions you have with your health care provider. Document Released: 01/20/2011 Document Revised: 12/13/2015 Document Reviewed: 04/10/2015 Elsevier Interactive Patient Education  [employer].

## 2018-04-19 NOTE — Therapy (Addendum)
[facility] [address], [place], [place], [address] Phone: [phone]   Fax:  [phone]  Physical Therapy Treatment  Patient Details  Name: SOFIAH LYNE MRN: [id number] Date of Birth: [birth year] Referring Provider (PT): [name]   Encounter Date: 04/19/2018  PT End of Session - 04/19/18 1521    Visit Number  2    Date for PT Re-Evaluation  05/27/18    PT Start Time  28   Pt did not need > 30 min   PT Stop Time  1550    PT Time Calculation (min)  27 min    Activity Tolerance  Patient tolerated treatment well    Behavior During Therapy  [facility] for tasks assessed/performed       Past Medical History:  Diagnosis Date  . ALLERGIC RHINITIS CAUSE UNSPECIFIED 04/30/2009  . Anxiety   . DEPRESSION, HX OF 04/11/2010  . Dysuria 05/06/2010  . Gallstones   . GERD 04/30/2009  . HYPERLIPIDEMIA 04/30/2009  . HYPERTENSION 04/30/2009  . LEG EDEMA 12/12/2009  . OSTEOPENIA 04/30/2009  . PVC (premature ventricular contraction)   . Status post dilation of esophageal narrowing     Past Surgical History:  Procedure Laterality Date  . ABDOMINAL HYSTERECTOMY  1986  . AUGMENTATION MAMMAPLASTY    . BREAST ENHANCEMENT SURGERY  1987  . BREAST SURGERY  1974   biopsy  . CARDIAC CATHETERIZATION N/A 11/02/2015   Procedure: Left Heart Cath and Coronary Angiography;  Surgeon: Belva Crome, MD;  Location: [facility];  Service: Cardiovascular;  Laterality: N/A;  . CATARACT EXTRACTION, BILATERAL Bilateral    one in feb and one in March Dr. Tommy Rainwater  . CHOLECYSTECTOMY  1974  . ectopic pregnancy    . OTHER SURGICAL HISTORY     Stomach was not developed at birth    There were no vitals filed for this visit.  Subjective Assessment - 04/19/18 1522    Subjective  Doing my HEP, they are fine.     Patient Stated Goals  be more steady    Currently in Pain?  No/denies    Multiple Pain Sites  No                        [facility] Adult PT Treatment/Exercise - 04/19/18 0001      Neuro Re-ed    Neuro Re-ed Details   Review HEP: Pt independent. Added backwards walking and sit to stand which she was already doing at home.       Knee/Hip Exercises: Aerobic   Nustep  L2 x 6 min   PTA present to discuss status.     Knee/Hip Exercises: Standing   Hip Abduction  Stengthening;Both;1 set;10 reps;Knee straight   VC to lift vs throw hip   SLS  Bil 3x 10-20 sec using counter top   Added to HEP. UE used     Knee/Hip Exercises: Seated   Sit to Sand  1 set;10 reps;without UE support      Knee/Hip Exercises: Supine   Bridges  Strengthening;Both;1 set;10 reps             PT Education - 04/19/18 1550    Education Details  HEP addition: Single leg stance, supine bridge, standing hip abduction, backwards walking, and sit to stand    Person(s) Educated  Patient    Methods  Explanation;Demonstration;Tactile cues;Verbal cues;Handout    Comprehension  Returned demonstration;Verbalized understanding  PT Long Term Goals - 04/19/18 1554      PT LONG TERM GOAL #3   Title  Pt will report feeling 50% more stability when walking            Plan - 04/19/18 1521    Clinical Impression Statement  Pt presents today with increased confidence with her balance and overall steadiness. She is compliant and independent in her initial HEP. Todays session added to her HEP to include more balance exercises and hip abductor strengthening exercises which will ultimately help steady her at her pelvis.  Pt had already been doing the bridge and sit to stand from previous HEP and did not need much instruction, therefore session was short. Pt was slightly nervous to try backwards walking, but got more more confident as she practiced a few times.     Rehab Potential  Excellent    PT Frequency  1x / week    PT Duration  6 weeks    PT Treatment/Interventions  ADLs/Self Care Home  Management;Cryotherapy;Electrical Stimulation;Moist Heat;Gait training;Stair training;Functional mobility training;Therapeutic activities;Therapeutic exercise;Balance training;Neuromuscular re-education;Patient/family education;Manual techniques;Passive range of motion;Taping;Dry needling    PT Next Visit Plan  Do sit to stand test for goals and potentially BERG. Possible early DC if pt continues to improve this week as she does her new HEP.     PT Home Exercise Plan  Access Code: [email or web address]     Consulted and Agree with Plan of Care  Patient       Patient will benefit from skilled therapeutic intervention in order to improve the following deficits and impairments:  Abnormal gait, Pain, Postural dysfunction, Decreased strength, Decreased balance  Visit Diagnosis: Difficulty in walking, not elsewhere classified  History of falling  Muscle weakness (generalized)     Problem List Patient Active Problem List   Diagnosis Date Noted  . Normal coronary arteries 11/19/2015  . Palpitations 11/19/2015  . Abnormal nuclear stress test 11/02/2015  . Right sided abdominal pain 12/14/2013  . Flank pain 12/14/2013  . Pancreatic cyst 12/14/2013  . History of diverticulitis 08/15/2013  . Lumbosacral radiculopathy at S1 06/06/2013  . DYSURIA 05/06/2010  . DEPRESSION, HX OF 04/11/2010  . LEG EDEMA 12/12/2009  . CONTUSION, LEG 08/13/2009  . Dyslipidemia 04/30/2009  . Essential hypertension 04/30/2009  . ALLERGIC RHINITIS CAUSE UNSPECIFIED 04/30/2009  . GERD 04/30/2009  . OSTEOPENIA 04/30/2009    Braidyn Peace, PTA 04/19/2018, 3:55 PM  [facility] [address] [place], [place], [address] Phone: [phone]   Fax:  [phone]  Name: SHAWNDREA RUTKOWSKI MRN: [id number] Date of Birth: [birth year]  Access Code: [email or web address]  URL: [email or web address]  Date: 04/19/2018  Prepared by: Myrene Galas   Exercises   Tandem Stance with Head Rotation - 10 reps - 3 sets - 1x daily - 7x weekly  Forward Backward Weight Shift with Counter Support - 10 reps - 3 sets - 1x daily - 7x weekly  Standing Hip Abduction with Counter Support - 10 reps - 1 sets - 2x daily - 7x weekly  Supine Bridge - 10 reps - 5 hold - 2x daily - 7x weekly  Single Leg Stance with Support - 10 reps - 3 sets - 1x daily - 7x weekly   PHYSICAL THERAPY DISCHARGE SUMMARY  Visits from Start of Care: 2  Current functional level related to goals / functional outcomes: See above for recent update   Remaining deficits: See above   Education / Equipment: HEP  Plan: Patient agrees to discharge.  Patient goals were not met. Patient is being discharged due to the patient's request.  ?????    Pt called and reported she is feeling better  Zannie Cove, PT 05/04/18 7:47 AM

## 2018-04-27 ENCOUNTER — Ambulatory Visit: Payer: Medicare HMO | Admitting: Physical Therapy

## 2018-04-27 ENCOUNTER — Telehealth: Payer: Self-pay

## 2018-04-27 NOTE — Telephone Encounter (Signed)
Called patient and gave her information she was requesting.  Copied from [place] [phone]. Topic: General - Other >> Apr 27, 2018  2:10 PM Oneta Rack wrote: Relation to pt: self  Call back number: [phone]   Reason for call:  Patient requesting to speak with "Rebecca Reid" Dr. Elease Hashimoto nurse stating it was personal and declined to elaborate, please advise

## 2018-05-04 ENCOUNTER — Ambulatory Visit: Payer: Medicare HMO | Admitting: Physical Therapy

## 2018-05-24 ENCOUNTER — Other Ambulatory Visit: Payer: Self-pay | Admitting: Family Medicine

## 2018-05-26 ENCOUNTER — Other Ambulatory Visit: Payer: Self-pay | Admitting: Family Medicine

## 2018-06-09 ENCOUNTER — Encounter: Payer: Self-pay | Admitting: Family Medicine

## 2018-07-03 ENCOUNTER — Other Ambulatory Visit: Payer: Self-pay | Admitting: Family Medicine

## 2018-07-07 ENCOUNTER — Encounter: Payer: Self-pay | Admitting: Family Medicine

## 2018-07-23 ENCOUNTER — Ambulatory Visit (INDEPENDENT_AMBULATORY_CARE_PROVIDER_SITE_OTHER): Payer: Medicare HMO | Admitting: Family Medicine

## 2018-07-23 ENCOUNTER — Other Ambulatory Visit: Payer: Self-pay

## 2018-07-23 ENCOUNTER — Encounter: Payer: Self-pay | Admitting: Family Medicine

## 2018-07-23 VITALS — BP 128/82 | HR 38 | Temp 97.5°F | Ht 64.0 in | Wt 180.6 lb

## 2018-07-23 DIAGNOSIS — M7061 Trochanteric bursitis, right hip: Secondary | ICD-10-CM

## 2018-07-23 DIAGNOSIS — R001 Bradycardia, unspecified: Secondary | ICD-10-CM | POA: Diagnosis not present

## 2018-07-23 MED ORDER — METHYLPREDNISOLONE ACETATE 40 MG/ML IJ SUSP
40.0000 mg | Freq: Once | INTRAMUSCULAR | Status: AC
  Administered 2018-07-23: 40 mg via INTRA_ARTICULAR

## 2018-07-23 NOTE — Patient Instructions (Signed)
Trochanteric Bursitis  Trochanteric bursitis is a condition that causes hip pain. Trochanteric bursitis happens when fluid-filled sacs (bursae) in the hip get irritated. Normally these sacs absorb shock and help strong bands of tissue (tendons) in your hip glide smoothly over each other and over your hip bones.  What are the causes?  This condition results from increased friction between the hip bones and the tendons that go over them. This condition can happen if you:  · Have weak hips.  · Use your hip muscles too much (overuse).  · Get hit in the hip.  What increases the risk?  This condition is more likely to develop in:  · Women.  · Adults who are middle-aged or older.  · People with arthritis or a spinal condition.  · People with weak buttocks muscles (gluteal muscles).  · People who have one leg that is shorter than the other.  · People who participate in certain kinds of athletic activities, such as:  ? Running sports, especially long-distance running.  ? Contact sports, like football or martial arts.  ? Sports in which falls may occur, like skiing.  What are the signs or symptoms?  The main symptom of this condition is pain and tenderness over the point of your hip. The pain may be:  · Sharp and intense.  · Dull and achy.  · Felt on the outside of your thigh.  It may increase when you:  · Lie on your side.  · Walk or run.  · Go up on stairs.  · Sit.  · Stand up after sitting.  · Stand for long periods of time.  How is this diagnosed?  This condition may be diagnosed based on:  · Your symptoms.  · Your medical history.  · A physical exam.  · Imaging tests, such as:  ? X-rays to check your bones.  ? An MRI or ultrasound to check your tendons and muscles.  During your physical exam, your health care provider will check the movement and strength of your hip. He or she may press on the point of your hip to check for pain.  How is this treated?  This condition may be treated by:  · Resting.  · Reducing your  activity.  · Avoiding activities that cause pain.  · Using crutches, a cane, or a walker to decrease the strain on your hip.  · Taking medicine to help with swelling.  · Having medicine injected into the bursae to help with swelling.  · Using ice, heat, and massage therapy for pain relief.  · Physical therapy exercises for strength and flexibility.  · Surgery (rare).  Follow these instructions at home:  Activity  · Rest.  · Avoid activities that cause pain.  · Return to your normal activities as told by your health care provider. Ask your health care provider what activities are safe for you.  Managing pain, stiffness, and swelling  · Take over-the-counter and prescription medicines only as told by your health care provider.  · If directed, apply heat to the injured area as told by your health care provider.  ? Place a towel between your skin and the heat source.  ? Leave the heat on for 20-30 minutes.  ? Remove the heat if your skin turns bright red. This is especially important if you are unable to feel pain, heat, or cold. You may have a greater risk of getting burned.  · If directed, apply ice to the injured area:  ?   Put ice in a plastic bag.  ? Place a towel between your skin and the bag.  ? Leave the ice on for 20 minutes, 2-3 times a day.  General instructions  · If the affected leg is one that you use for driving, ask your health care provider when it is safe to drive.  · Use crutches, a cane, or a walker as told by your health care provider.  · If one of your legs is shorter than the other, get fitted for a shoe insert.  · Lose weight if you are overweight.  How is this prevented?  · Wear supportive footwear that is appropriate for your sport.  · If you have hip pain, start any new exercise or sport slowly.  · Maintain physical fitness, including:  ? Strength.  ? Flexibility.  Contact a health care provider if:  · Your pain does not improve with 2-4 weeks.  Get help right away if:  · You develop severe  pain.  · You have a fever.  · You develop increased redness over your hip.  · You have a change in your bowel function or bladder function.  · You cannot control the muscles in your feet.  This information is not intended to replace advice given to you by your health care provider. Make sure you discuss any questions you have with your health care provider.  Document Released: 08/14/2004 Document Revised: 03/12/2016 Document Reviewed: 06/22/2015  Elsevier Interactive Patient Education © 2019 [employer]

## 2018-07-23 NOTE — Progress Notes (Signed)
Subjective:     Patient ID: Rebecca Reid, female   DOB: August 05, 1945, 73 y.o.   MRN: [id number]  HPI Patient is seen with right lateral hip pain.  Duration about 2 months.  No recent injury.  She thinks this is bursitis related.  She has tried icing and some Tylenol without improvement.  She has responded well to injection in the past She actually had left hip injection back in September and improved following that  She has history of hypertension currently treated with losartan.  She has history of frequent palpitations and takes metoprolol 50 mg daily for that.  Denies any dizziness.  No recurrent syncope.  Cardiac cath 4/17 normal coronaries.  Currently asymptomatic.    Past Medical History:  Diagnosis Date  . ALLERGIC RHINITIS CAUSE UNSPECIFIED 04/30/2009  . Anxiety   . DEPRESSION, HX OF 04/11/2010  . Dysuria 05/06/2010  . Gallstones   . GERD 04/30/2009  . HYPERLIPIDEMIA 04/30/2009  . HYPERTENSION 04/30/2009  . LEG EDEMA 12/12/2009  . OSTEOPENIA 04/30/2009  . PVC (premature ventricular contraction)   . Status post dilation of esophageal narrowing    Past Surgical History:  Procedure Laterality Date  . ABDOMINAL HYSTERECTOMY  1986  . AUGMENTATION MAMMAPLASTY    . BREAST ENHANCEMENT SURGERY  1987  . BREAST SURGERY  1974   biopsy  . CARDIAC CATHETERIZATION N/A 11/02/2015   Procedure: Left Heart Cath and Coronary Angiography;  Surgeon: Belva Crome, MD;  Location: [facility];  Service: Cardiovascular;  Laterality: N/A;  . CATARACT EXTRACTION, BILATERAL Bilateral    one in feb and one in March Dr. Tommy Rainwater  . CHOLECYSTECTOMY  1974  . ectopic pregnancy    . OTHER SURGICAL HISTORY     Stomach was not developed at birth    reports that she has never smoked. She has never used smokeless tobacco. She reports current alcohol use. She reports that she does not use drugs. family history includes Arthritis in her mother; Emphysema in her mother; Heart disease (age of onset: 63)  in her father; Heart disease (age of onset: 75) in her mother; Hyperlipidemia in her father and mother; Hypertension in her father and mother; Prostate cancer in her father; Skin cancer in her mother. Allergies  Allergen Reactions  . Erythromycin Base Other (See Comments)    yeast infection, GI upset  . Naproxen Other (See Comments)    GI upset  . Prednisone Hives    REACTION: hives, anxiety, insomnia  . Scallops [Shellfish Allergy] Nausea And Vomiting     Review of Systems  Constitutional: Negative for fatigue.  Eyes: Negative for visual disturbance.  Respiratory: Negative for cough, chest tightness, shortness of breath and wheezing.   Cardiovascular: Negative for chest pain, palpitations and leg swelling.  Neurological: Negative for dizziness, seizures, syncope, weakness, light-headedness and headaches.       Objective:   Physical Exam Constitutional:      Appearance: She is well-developed.  Eyes:     Pupils: Pupils are equal, round, and reactive to light.  Neck:     Musculoskeletal: Neck supple.     Thyroid: No thyromegaly.     Vascular: No JVD.  Cardiovascular:     Rate and Rhythm: Normal rate and regular rhythm.     Heart sounds: No gallop.   Pulmonary:     Effort: Pulmonary effort is normal. No respiratory distress.     Breath sounds: Normal breath sounds. No wheezing or rales.  Musculoskeletal:  Comments: Right hip good range of motion.  She has tenderness laterally over the bursa region  Neurological:     Mental Status: She is alert.        Assessment:     #1 trochanteric bursitis right hip  #2 bradycardia by initial vital signs-rate palpated around 40 but auscultated in the 60s.      Plan:     -Check EKG- shows trigeminy pattern. -continue with Metoprolol. -discussed risks of steroid injection right greater trochanteric bursa region including bruising, bleeding, infection and pt consented. Using 25 gauge one and one half inch needle injected 2 cc of  plain xylocaine and 40 mg of depo-medrol.  Pt tolerated well -she is instructed to use some ice as needed tonight. -touch base if hip pain no better in one week.  [name] MD  Primary Care at [facility]

## 2018-08-21 ENCOUNTER — Other Ambulatory Visit: Payer: Self-pay | Admitting: Family Medicine

## 2018-09-03 ENCOUNTER — Ambulatory Visit (INDEPENDENT_AMBULATORY_CARE_PROVIDER_SITE_OTHER): Payer: Medicare HMO | Admitting: Family Medicine

## 2018-09-03 ENCOUNTER — Encounter: Payer: Self-pay | Admitting: Family Medicine

## 2018-09-03 VITALS — BP 120/80 | HR 70 | Temp 97.9°F | Ht 64.0 in | Wt 183.4 lb

## 2018-09-03 DIAGNOSIS — J019 Acute sinusitis, unspecified: Secondary | ICD-10-CM | POA: Diagnosis not present

## 2018-09-03 MED ORDER — AMOXICILLIN-POT CLAVULANATE 875-125 MG PO TABS: 1.0000 | ORAL_TABLET | Freq: Two times a day (BID) | ORAL | 0 refills | Status: DC

## 2018-09-03 NOTE — Progress Notes (Signed)
Subjective:     Patient ID: Rebecca Reid, female   DOB: 11/21/1945, 73 y.o.   MRN: [id number]  HPI Patient is seen with several week history of progressive sinus symptoms.  She is had a most daily frontal sinus headaches.  She went for massage yesterday which has not helped much.  She is had upper teeth pain.  Bilateral ear pain.  Greenish nasal discharge.  Increased malaise.  Occasional cough.  She has tried saline nasal irrigation without much improvement  Past Medical History:  Diagnosis Date  . ALLERGIC RHINITIS CAUSE UNSPECIFIED 04/30/2009  . Anxiety   . DEPRESSION, HX OF 04/11/2010  . Dysuria 05/06/2010  . Gallstones   . GERD 04/30/2009  . HYPERLIPIDEMIA 04/30/2009  . HYPERTENSION 04/30/2009  . LEG EDEMA 12/12/2009  . OSTEOPENIA 04/30/2009  . PVC (premature ventricular contraction)   . Status post dilation of esophageal narrowing    Past Surgical History:  Procedure Laterality Date  . ABDOMINAL HYSTERECTOMY  1986  . AUGMENTATION MAMMAPLASTY    . BREAST ENHANCEMENT SURGERY  1987  . BREAST SURGERY  1974   biopsy  . CARDIAC CATHETERIZATION N/A 11/02/2015   Procedure: Left Heart Cath and Coronary Angiography;  Surgeon: Belva Crome, MD;  Location: [facility];  Service: Cardiovascular;  Laterality: N/A;  . CATARACT EXTRACTION, BILATERAL Bilateral    one in feb and one in March Dr. Tommy Rainwater  . CHOLECYSTECTOMY  1974  . ectopic pregnancy    . OTHER SURGICAL HISTORY     Stomach was not developed at birth    reports that she has never smoked. She has never used smokeless tobacco. She reports current alcohol use. She reports that she does not use drugs. family history includes Arthritis in her mother; Emphysema in her mother; Heart disease (age of onset: 47) in her father; Heart disease (age of onset: 20) in her mother; Hyperlipidemia in her father and mother; Hypertension in her father and mother; Prostate cancer in her father; Skin cancer in her mother. Allergies   Allergen Reactions  . Erythromycin Base Other (See Comments)    yeast infection, GI upset  . Naproxen Other (See Comments)    GI upset  . Prednisone Hives    REACTION: hives, anxiety, insomnia  . Scallops [Shellfish Allergy] Nausea And Vomiting     Review of Systems  Constitutional: Positive for fatigue. Negative for chills and fever.  HENT: Positive for congestion, ear pain, sinus pressure and sinus pain.   Respiratory: Positive for cough. Negative for shortness of breath.   Neurological: Positive for headaches.       Objective:   Physical Exam Constitutional:      Appearance: Normal appearance.  HENT:     Right Ear: Tympanic membrane and external ear normal.     Left Ear: Tympanic membrane and external ear normal.     Mouth/Throat:     Pharynx: Oropharynx is clear. No oropharyngeal exudate or posterior oropharyngeal erythema.  Neck:     Musculoskeletal: Neck supple.  Cardiovascular:     Rate and Rhythm: Normal rate and regular rhythm.  Pulmonary:     Effort: Pulmonary effort is normal.     Breath sounds: Normal breath sounds. No wheezing or rales.  Lymphadenopathy:     Cervical: No cervical adenopathy.  Neurological:     Mental Status: She is alert.        Assessment:     Probable acute pansinusitis.-Several predictors including duration, frequent headaches, upper teeth pain,  purulent secretions Plan:     -Start Augmentin 875 mg twice daily with food -Continue saline nasal irrigation -Follow-up in 2 weeks if not better  [name] MD [facility]

## 2018-09-03 NOTE — Patient Instructions (Signed)
Sinusitis, Adult  Sinusitis is inflammation of your sinuses. Sinuses are hollow spaces in the bones around your face. Your sinuses are located:   Around your eyes.   In the middle of your forehead.   Behind your nose.   In your cheekbones.  Mucus normally drains out of your sinuses. When your nasal tissues become inflamed or swollen, mucus can become trapped or blocked. This allows bacteria, viruses, and fungi to grow, which leads to infection. Most infections of the sinuses are caused by a virus.  Sinusitis can develop quickly. It can last for up to 4 weeks (acute) or for more than 12 weeks (chronic). Sinusitis often develops after a cold.  What are the causes?  This condition is caused by anything that creates swelling in the sinuses or stops mucus from draining. This includes:   Allergies.   Asthma.   Infection from bacteria or viruses.   Deformities or blockages in your nose or sinuses.   Abnormal growths in the nose (nasal polyps).   Pollutants, such as chemicals or irritants in the air.   Infection from fungi (rare).  What increases the risk?  You are more likely to develop this condition if you:   Have a weak body defense system (immune system).   Do a lot of swimming or diving.   Overuse nasal sprays.   Smoke.  What are the signs or symptoms?  The main symptoms of this condition are pain and a feeling of pressure around the affected sinuses. Other symptoms include:   Stuffy nose or congestion.   Thick drainage from your nose.   Swelling and warmth over the affected sinuses.   Headache.   Upper toothache.   A cough that may get worse at night.   Extra mucus that collects in the throat or the back of the nose (postnasal drip).   Decreased sense of smell and taste.   Fatigue.   A fever.   Sore throat.   Bad breath.  How is this diagnosed?  This condition is diagnosed based on:   Your symptoms.   Your medical history.   A physical exam.   Tests to find out if your condition is  acute or chronic. This may include:  ? Checking your nose for nasal polyps.  ? Viewing your sinuses using a device that has a light (endoscope).  ? Testing for allergies or bacteria.  ? Imaging tests, such as an MRI or CT scan.  In rare cases, a bone biopsy may be done to rule out more serious types of fungal sinus disease.  How is this treated?  Treatment for sinusitis depends on the cause and whether your condition is chronic or acute.   If caused by a virus, your symptoms should go away on their own within 10 days. You may be given medicines to relieve symptoms. They include:  ? Medicines that shrink swollen nasal passages (topical intranasal decongestants).  ? Medicines that treat allergies (antihistamines).  ? A spray that eases inflammation of the nostrils (topical intranasal corticosteroids).  ? Rinses that help get rid of thick mucus in your nose (nasal saline washes).   If caused by bacteria, your health care provider may recommend waiting to see if your symptoms improve. Most bacterial infections will get better without antibiotic medicine. You may be given antibiotics if you have:  ? A severe infection.  ? A weak immune system.   If caused by narrow nasal passages or nasal polyps, you may need   to have surgery.  Follow these instructions at home:  Medicines   Take, use, or apply over-the-counter and prescription medicines only as told by your health care provider. These may include nasal sprays.   If you were prescribed an antibiotic medicine, take it as told by your health care provider. Do not stop taking the antibiotic even if you start to feel better.  Hydrate and humidify     Drink enough fluid to keep your urine pale yellow. Staying hydrated will help to thin your mucus.   Use a cool mist humidifier to keep the humidity level in your home above 50%.   Inhale steam for 10-15 minutes, 3-4 times a day, or as told by your health care provider. You can do this in the bathroom while a hot shower is  running.   Limit your exposure to cool or dry air.  Rest   Rest as much as possible.   Sleep with your head raised (elevated).   Make sure you get enough sleep each night.  General instructions     Apply a warm, moist washcloth to your face 3-4 times a day or as told by your health care provider. This will help with discomfort.   Wash your hands often with soap and water to reduce your exposure to germs. If soap and water are not available, use hand sanitizer.   Do not smoke. Avoid being around people who are smoking (secondhand smoke).   Keep all follow-up visits as told by your health care provider. This is important.  Contact a health care provider if:   You have a fever.   Your symptoms get worse.   Your symptoms do not improve within 10 days.  Get help right away if:   You have a severe headache.   You have persistent vomiting.   You have severe pain or swelling around your face or eyes.   You have vision problems.   You develop confusion.   Your neck is stiff.   You have trouble breathing.  Summary   Sinusitis is soreness and inflammation of your sinuses. Sinuses are hollow spaces in the bones around your face.   This condition is caused by nasal tissues that become inflamed or swollen. The swelling traps or blocks the flow of mucus. This allows bacteria, viruses, and fungi to grow, which leads to infection.   If you were prescribed an antibiotic medicine, take it as told by your health care provider. Do not stop taking the antibiotic even if you start to feel better.   Keep all follow-up visits as told by your health care provider. This is important.  This information is not intended to replace advice given to you by your health care provider. Make sure you discuss any questions you have with your health care provider.  Document Released: 07/07/2005 Document Revised: 12/07/2017 Document Reviewed: 12/07/2017  Elsevier Interactive Patient Education  2019 [employer]

## 2018-09-13 DIAGNOSIS — H04123 Dry eye syndrome of bilateral lacrimal glands: Secondary | ICD-10-CM | POA: Diagnosis not present

## 2018-09-13 DIAGNOSIS — H2512 Age-related nuclear cataract, left eye: Secondary | ICD-10-CM | POA: Diagnosis not present

## 2018-09-13 DIAGNOSIS — H2511 Age-related nuclear cataract, right eye: Secondary | ICD-10-CM | POA: Diagnosis not present

## 2018-10-01 ENCOUNTER — Other Ambulatory Visit: Payer: Self-pay | Admitting: Family Medicine

## 2018-11-10 ENCOUNTER — Telehealth: Payer: Medicare HMO | Admitting: Family

## 2018-11-10 DIAGNOSIS — J019 Acute sinusitis, unspecified: Secondary | ICD-10-CM | POA: Diagnosis not present

## 2018-11-10 DIAGNOSIS — B9689 Other specified bacterial agents as the cause of diseases classified elsewhere: Secondary | ICD-10-CM

## 2018-11-10 MED ORDER — AMOXICILLIN-POT CLAVULANATE 875-125 MG PO TABS: 1.0000 | ORAL_TABLET | Freq: Two times a day (BID) | ORAL | 0 refills | Status: DC

## 2018-11-10 NOTE — Progress Notes (Signed)

## 2018-12-16 ENCOUNTER — Other Ambulatory Visit: Payer: Self-pay | Admitting: Family Medicine

## 2018-12-28 ENCOUNTER — Other Ambulatory Visit: Payer: Self-pay | Admitting: Family Medicine

## 2019-02-03 ENCOUNTER — Encounter: Payer: Self-pay | Admitting: Family Medicine

## 2019-02-03 ENCOUNTER — Telehealth: Payer: Self-pay | Admitting: Family Medicine

## 2019-02-03 NOTE — Telephone Encounter (Signed)
Medication Refill - Medication: sertraline (ZOLOFT) 50 MG tablet   Has the patient contacted their pharmacy? Yes.   (Agent: If no, request that the patient contact the pharmacy for the refill.) (Agent: If yes, when and what did the pharmacy advise?)  Preferred Pharmacy (with phone number or street name):  [facility] [address] [place] [place] [address]  Phone: [phone] Fax: [phone]   Agent: Please be advised that RX refills may take up to 3 business days. We ask that you follow-up with your pharmacy.

## 2019-02-04 ENCOUNTER — Other Ambulatory Visit: Payer: Self-pay

## 2019-02-04 MED ORDER — SERTRALINE HCL 50 MG PO TABS: 50.0000 mg | ORAL_TABLET | Freq: Every day | ORAL | 0 refills | Status: DC

## 2019-02-04 NOTE — Telephone Encounter (Signed)
Medication has been sent to the pharmacy. 

## 2019-02-09 ENCOUNTER — Encounter: Payer: Self-pay | Admitting: Family Medicine

## 2019-03-02 ENCOUNTER — Encounter: Payer: Self-pay | Admitting: Family Medicine

## 2019-03-03 MED ORDER — METRONIDAZOLE 0.75 % EX GEL: 1.0000 "application " | Freq: Two times a day (BID) | CUTANEOUS | 2 refills | Status: DC

## 2019-03-16 ENCOUNTER — Other Ambulatory Visit: Payer: Self-pay | Admitting: Family Medicine

## 2019-03-25 ENCOUNTER — Encounter: Payer: Self-pay | Admitting: Family Medicine

## 2019-03-29 ENCOUNTER — Other Ambulatory Visit: Payer: Self-pay

## 2019-03-29 ENCOUNTER — Ambulatory Visit (INDEPENDENT_AMBULATORY_CARE_PROVIDER_SITE_OTHER): Payer: Medicare HMO

## 2019-03-29 DIAGNOSIS — Z23 Encounter for immunization: Secondary | ICD-10-CM

## 2019-03-29 NOTE — Progress Notes (Signed)
Patient in office for flu vaccine. Flu vaccine administered with no reactions.

## 2019-03-30 ENCOUNTER — Encounter: Payer: Self-pay | Admitting: Family Medicine

## 2019-03-31 ENCOUNTER — Encounter: Payer: Self-pay | Admitting: Family Medicine

## 2019-04-11 DIAGNOSIS — R69 Illness, unspecified: Secondary | ICD-10-CM | POA: Diagnosis not present

## 2019-04-12 DIAGNOSIS — R69 Illness, unspecified: Secondary | ICD-10-CM | POA: Diagnosis not present

## 2019-04-30 ENCOUNTER — Other Ambulatory Visit: Payer: Self-pay

## 2019-04-30 ENCOUNTER — Encounter: Payer: Self-pay | Admitting: Family Medicine

## 2019-04-30 ENCOUNTER — Ambulatory Visit (INDEPENDENT_AMBULATORY_CARE_PROVIDER_SITE_OTHER): Payer: Medicare HMO | Admitting: Family Medicine

## 2019-04-30 DIAGNOSIS — R197 Diarrhea, unspecified: Secondary | ICD-10-CM | POA: Diagnosis not present

## 2019-04-30 MED ORDER — ONDANSETRON HCL 4 MG PO TABS: 4.0000 mg | ORAL_TABLET | Freq: Three times a day (TID) | ORAL | 0 refills | Status: DC | PRN

## 2019-04-30 NOTE — Progress Notes (Signed)
Virtual visit completed through WebEx or similar program Patient location: home  Provider location: Financial controller at [facility], office   Pandemic considerations d/w pt.   Limitations and rationale for visit method d/w patient.  Patient agreed to proceed.   CC: GI Sx.    HPI: sx started about 10 hours ago.  She didn't feel well before but clearly sx increased about 10 hours ago.  Nausea, chills, diarrhea.  Vomited once. Still with diarrhea.  occ fecal urgency.  She was sore inferior to the xiphoid area.  She called into triage line, scheduled this AM.    She is planning to travel to [place] tomorrow, assuming she felt well.  She has plans to limit exposure to covid while on vacation.    She doesn't feel awful right now- she feels some better.  No new foods or contacts or atypical exposures.  She has been careful with covid cautions, d/w pt.  No fever now but didn't check temp last night.  No blood in stool. No blood in vomitus.  Prev pain near xiphoid resolved.  Not SOB.  No change in taste or smell.  She isn't lightheaded.    Meds and allergies reviewed.  Vitals reviewed.   ROS: Per HPI unless specifically indicated in ROS section   NAD Speech wnl Well appearing.   A/P: Diarrhea.  Presumed AGE.  Nontoxic.  She feels some better in the meantime.  It may make sense to hold BP med until diarrhea resolved.   Hydration d/w pt.   Can use zofran prn.  rx sent.  Routine cautions d/w pt.   Would defer covid testing at this point as the only option would be in the ER.  She doesn't appear to need ER eval or IV fluids.  She'll update [place] as needed.  As of now her travel plans are not cancelled and she will see how she feels tomorrow.   She knows to call back to the triage line if needed.

## 2019-04-30 NOTE — Assessment & Plan Note (Signed)
Diarrhea.  Presumed AGE.  Nontoxic.  She feels some better in the meantime.  It may make sense to hold BP med until diarrhea resolved.   Hydration d/w pt.   Can use zofran prn.  rx sent.  Routine cautions d/w pt.   Would defer covid testing at this point as the only option would be in the ER.  She doesn't appear to need ER eval or IV fluids.  She'll update [place] as needed.  As of now her travel plans are not cancelled and she will see how she feels tomorrow.   She knows to call back to the triage line if needed.

## 2019-05-09 ENCOUNTER — Encounter: Payer: Self-pay | Admitting: Family Medicine

## 2019-05-18 ENCOUNTER — Telehealth (INDEPENDENT_AMBULATORY_CARE_PROVIDER_SITE_OTHER): Payer: Medicare HMO | Admitting: Family Medicine

## 2019-05-18 ENCOUNTER — Other Ambulatory Visit: Payer: Self-pay

## 2019-05-18 DIAGNOSIS — F418 Other specified anxiety disorders: Secondary | ICD-10-CM | POA: Diagnosis not present

## 2019-05-18 DIAGNOSIS — J309 Allergic rhinitis, unspecified: Secondary | ICD-10-CM | POA: Diagnosis not present

## 2019-05-18 DIAGNOSIS — R69 Illness, unspecified: Secondary | ICD-10-CM | POA: Diagnosis not present

## 2019-05-18 MED ORDER — AZELASTINE HCL 0.1 % NA SOLN: 2.0000 | Freq: Two times a day (BID) | NASAL | 12 refills | Status: DC

## 2019-05-18 NOTE — Progress Notes (Signed)
This visit type was conducted due to national recommendations for restrictions regarding the COVID-19 pandemic in an effort to limit this patient's exposure and mitigate transmission in our community.   Virtual Visit via Video Note  I connected with Rebecca Reid on 05/18/19 at  2:00 PM EDT by a video enabled telemedicine application and verified that I am speaking with the correct person using two identifiers.  Location patient: home Location provider:work or home office Persons participating in the virtual visit: patient, provider  I discussed the limitations of evaluation and management by telemedicine and the availability of in person appointments. The patient expressed understanding and agreed to proceed.   HPI:  Rebecca Reid states she has had at least couple months and possibly longer of persistent postnasal drainage and nasal congestion.  No fever.  Occasional cough.  She tried Zyrtec, Benadryl, and Sudafed without relief.  Her postnasal drainage is almost continuous.  No bloody drainage.  No purulent secretions.  She has bilateral ear pressure.  She also relates increased stress issues with Covid.  Her husband has significant medical problems and they are staying very isolated.  She has had some recent increased anxiety and irritability symptoms.  Currently on sertraline 50 mg daily.  She questions whether she can titrate this up.   ROS: See pertinent positives and negatives per HPI.  Past Medical History:  Diagnosis Date  . ALLERGIC RHINITIS CAUSE UNSPECIFIED 04/30/2009  . Anxiety   . DEPRESSION, HX OF 04/11/2010  . Dysuria 05/06/2010  . Gallstones   . GERD 04/30/2009  . HYPERLIPIDEMIA 04/30/2009  . HYPERTENSION 04/30/2009  . LEG EDEMA 12/12/2009  . OSTEOPENIA 04/30/2009  . PVC (premature ventricular contraction)   . Status post dilation of esophageal narrowing     Past Surgical History:  Procedure Laterality Date  . ABDOMINAL HYSTERECTOMY  1986  . AUGMENTATION MAMMAPLASTY     . BREAST ENHANCEMENT SURGERY  1987  . BREAST SURGERY  1974   biopsy  . CARDIAC CATHETERIZATION N/A 11/02/2015   Procedure: Left Heart Cath and Coronary Angiography;  Surgeon: Belva Crome, MD;  Location: [facility];  Service: Cardiovascular;  Laterality: N/A;  . CATARACT EXTRACTION, BILATERAL Bilateral    one in feb and one in March Dr. Tommy Rainwater  . CHOLECYSTECTOMY  1974  . ectopic pregnancy    . OTHER SURGICAL HISTORY     Stomach was not developed at birth    Family History  Problem Relation Age of Onset  . Emphysema Mother        heavy smoker  . Heart disease Mother 60  . Hyperlipidemia Mother   . Hypertension Mother   . Skin cancer Mother        melanomia, spread to her spine  . Arthritis Mother   . Heart disease Father 24  . Prostate cancer Father   . Hyperlipidemia Father   . Hypertension Father   . Colon cancer Neg Hx   . Esophageal cancer Neg Hx   . Rectal cancer Neg Hx   . Breast cancer Neg Hx     SOCIAL HX: non-smoker   Current Outpatient Medications:  .  acetaminophen (TYLENOL) 500 MG tablet, Take 500 mg by mouth as needed., Disp: , Rfl:  .  aspirin 81 MG tablet, Take 81 mg by mouth daily.  , Disp: , Rfl:  .  azelastine (ASTELIN) 0.1 % nasal spray, Place 2 sprays into both nostrils 2 (two) times daily. Use in each nostril as directed, Disp: 30  mL, Rfl: 12 .  cetirizine (ZYRTEC) 5 MG tablet, Take 5 mg by mouth as needed for allergies., Disp: , Rfl:  .  cholecalciferol (VITAMIN D) 1000 units tablet, Take 5,000 Units by mouth daily. , Disp: , Rfl:  .  diclofenac sodium (VOLTAREN) 1 % GEL, Apply 2 g topically 4 (four) times daily., Disp: 100 g, Rfl: 1 .  losartan-hydrochlorothiazide (HYZAAR) 100-25 MG tablet, TAKE 1/2 TABLET BY MOUTH DAILY, Disp: 45 tablet, Rfl: 0 .  metoprolol succinate (TOPROL-XL) 50 MG 24 hr tablet, TAKE ONE TABLET BY MOUTH DAILY WITH OR IMMEDIATELY FOLLOWING FOOD, Disp: 90 tablet, Rfl: 0 .  metroNIDAZOLE (METROGEL) 0.75 % gel, Apply 1  application topically 2 (two) times daily., Disp: 45 g, Rfl: 2 .  omeprazole (PRILOSEC) 40 MG capsule, Take 80 mg by mouth 2 (two) times daily. , Disp: , Rfl:  .  ondansetron (ZOFRAN) 4 MG tablet, Take 1 tablet (4 mg total) by mouth every 8 (eight) hours as needed for nausea or vomiting., Disp: 20 tablet, Rfl: 0 .  sertraline (ZOLOFT) 50 MG tablet, Take 1 tablet (50 mg total) by mouth daily., Disp: 90 tablet, Rfl: 0  EXAM:  VITALS per patient if applicable:  GENERAL: alert, oriented, appears well and in no acute distress  HEENT: atraumatic, conjunttiva clear, no obvious abnormalities on inspection of external nose and ears  NECK: normal movements of the head and neck  LUNGS: on inspection no signs of respiratory distress, breathing rate appears normal, no obvious gross SOB, gasping or wheezing  CV: no obvious cyanosis  MS: moves all visible extremities without noticeable abnormality  PSYCH/NEURO: pleasant and cooperative, no obvious depression or anxiety, speech and thought processing grossly intact  ASSESSMENT AND PLAN:  Discussed the following assessment and plan:  #1 frequent postnasal drip symptoms.  Question allergic.  Does not sound likely infectious. -Try Astelin nasal 1 to 2 sprays each nostril twice daily -Touch base in a couple weeks if not improving -may continue with Zyrtec  #2 increased stress and anxiety symptoms  -Titrate sertraline to 75 mg daily for couple weeks and then further titrate to 100 mg if not seeing relief at 75 mg dose. -Continue nonpharmacologic ways to manage stress     I discussed the assessment and treatment plan with the patient. The patient was provided an opportunity to ask questions and all were answered. The patient agreed with the plan and demonstrated an understanding of the instructions.   The patient was advised to call back or seek an in-person evaluation if the symptoms worsen or if the condition fails to improve as  anticipated.    Carolann Littler, MD

## 2019-05-25 ENCOUNTER — Encounter: Payer: Self-pay | Admitting: Family Medicine

## 2019-05-25 DIAGNOSIS — R69 Illness, unspecified: Secondary | ICD-10-CM | POA: Diagnosis not present

## 2019-05-30 ENCOUNTER — Other Ambulatory Visit: Payer: Self-pay

## 2019-05-30 MED ORDER — SERTRALINE HCL 100 MG PO TABS: 100.0000 mg | ORAL_TABLET | Freq: Every day | ORAL | 3 refills | Status: DC

## 2019-05-31 ENCOUNTER — Encounter: Payer: Self-pay | Admitting: Family Medicine

## 2019-06-01 ENCOUNTER — Other Ambulatory Visit: Payer: Self-pay | Admitting: *Deleted

## 2019-06-01 DIAGNOSIS — B9689 Other specified bacterial agents as the cause of diseases classified elsewhere: Secondary | ICD-10-CM

## 2019-06-01 DIAGNOSIS — J019 Acute sinusitis, unspecified: Secondary | ICD-10-CM

## 2019-06-01 MED ORDER — AMOXICILLIN-POT CLAVULANATE 875-125 MG PO TABS: 1.0000 | ORAL_TABLET | Freq: Two times a day (BID) | ORAL | 0 refills | Status: DC

## 2019-06-01 NOTE — Progress Notes (Signed)
Spoke with patient. Informed her of Dr. Elease Hashimoto message in [place]. Patient verbalized understanding. Augmentin sent to pharmacy.

## 2019-06-17 ENCOUNTER — Other Ambulatory Visit: Payer: Self-pay | Admitting: Family Medicine

## 2019-06-17 ENCOUNTER — Encounter: Payer: Self-pay | Admitting: Family Medicine

## 2019-06-28 ENCOUNTER — Ambulatory Visit: Payer: Medicare HMO | Admitting: Family Medicine

## 2019-06-28 DIAGNOSIS — Z1212 Encounter for screening for malignant neoplasm of rectum: Secondary | ICD-10-CM | POA: Diagnosis not present

## 2019-06-28 DIAGNOSIS — Z1211 Encounter for screening for malignant neoplasm of colon: Secondary | ICD-10-CM | POA: Diagnosis not present

## 2019-06-28 LAB — COLOGUARD: Cologuard: NEGATIVE

## 2019-07-04 ENCOUNTER — Encounter: Payer: Self-pay | Admitting: Family Medicine

## 2019-07-05 NOTE — Telephone Encounter (Signed)
Patient scheduled for 2 pm on 07/06/2019.

## 2019-07-06 ENCOUNTER — Encounter: Payer: Self-pay | Admitting: Family Medicine

## 2019-07-06 ENCOUNTER — Ambulatory Visit (INDEPENDENT_AMBULATORY_CARE_PROVIDER_SITE_OTHER): Payer: Medicare HMO | Admitting: Family Medicine

## 2019-07-06 ENCOUNTER — Other Ambulatory Visit: Payer: Self-pay

## 2019-07-06 VITALS — BP 118/82 | HR 70 | Temp 97.3°F | Ht 64.0 in | Wt 184.0 lb

## 2019-07-06 DIAGNOSIS — M7062 Trochanteric bursitis, left hip: Secondary | ICD-10-CM

## 2019-07-06 DIAGNOSIS — H9203 Otalgia, bilateral: Secondary | ICD-10-CM

## 2019-07-06 DIAGNOSIS — J3089 Other allergic rhinitis: Secondary | ICD-10-CM

## 2019-07-06 MED ORDER — METHYLPREDNISOLONE ACETATE 80 MG/ML IJ SUSP
80.0000 mg | Freq: Once | INTRAMUSCULAR | Status: AC
  Administered 2019-07-06: 15:00:00 80 mg via INTRA_ARTICULAR

## 2019-07-06 MED ORDER — MONTELUKAST SODIUM 10 MG PO TABS: 10.0000 mg | ORAL_TABLET | Freq: Every day | ORAL | 3 refills | Status: DC

## 2019-07-06 NOTE — Progress Notes (Signed)
Subjective:     Patient ID: Rebecca Reid, female   DOB: 1945-09-23, 73 y.o.   MRN: [id number]  HPI   Rebecca Reid is seen with several weeks now of some bilateral ear discomfort.  She states it feels as if "she has water in her ears".    Left ear discomfort greater than right.  She has significant allergies and takes Zyrtec daily and had been on Flonase without much improvement.  We added Astelin with mild improvement.  She still has frequent rhinitis symptoms.  She thinks this is related to dust and mold.  No fever.  Only occasional headache.  No purulent secretions.  Second issue is that she has some recent left hip pain.  She is had bursitis and the other hip previously and benefited greatly from steroid injection.  She has achy discomfort which is lateral hip.  Requesting injection.  Pain is moderate and not relieved with icing  Past Medical History:  Diagnosis Date  . ALLERGIC RHINITIS CAUSE UNSPECIFIED 04/30/2009  . Anxiety   . DEPRESSION, HX OF 04/11/2010  . Dysuria 05/06/2010  . Gallstones   . GERD 04/30/2009  . HYPERLIPIDEMIA 04/30/2009  . HYPERTENSION 04/30/2009  . LEG EDEMA 12/12/2009  . OSTEOPENIA 04/30/2009  . PVC (premature ventricular contraction)   . Status post dilation of esophageal narrowing    Past Surgical History:  Procedure Laterality Date  . ABDOMINAL HYSTERECTOMY  1986  . AUGMENTATION MAMMAPLASTY    . BREAST ENHANCEMENT SURGERY  1987  . BREAST SURGERY  1974   biopsy  . CARDIAC CATHETERIZATION N/A 11/02/2015   Procedure: Left Heart Cath and Coronary Angiography;  Surgeon: Belva Crome, MD;  Location: [facility];  Service: Cardiovascular;  Laterality: N/A;  . CATARACT EXTRACTION, BILATERAL Bilateral    one in feb and one in March Dr. Tommy Rainwater  . CHOLECYSTECTOMY  1974  . ectopic pregnancy    . OTHER SURGICAL HISTORY     Stomach was not developed at birth    reports that she has never smoked. She has never used smokeless tobacco. She reports current  alcohol use. She reports that she does not use drugs. family history includes Arthritis in her mother; Emphysema in her mother; Heart disease (age of onset: 43) in her father; Heart disease (age of onset: 48) in her mother; Hyperlipidemia in her father and mother; Hypertension in her father and mother; Prostate cancer in her father; Skin cancer in her mother. Allergies  Allergen Reactions  . Erythromycin Base Other (See Comments)    yeast infection, GI upset  . Naproxen Other (See Comments)    GI upset  . Prednisone Hives    REACTION: hives, anxiety, insomnia  . Scallops [Shellfish Allergy] Nausea And Vomiting     Review of Systems  Constitutional: Negative for chills, fatigue and fever.  HENT: Positive for congestion and ear pain. Negative for hearing loss and sinus pain.   Respiratory: Negative for shortness of breath.   Cardiovascular: Negative for chest pain.       Objective:   Physical Exam Vitals reviewed.  Constitutional:      Appearance: Normal appearance.  HENT:     Right Ear: Tympanic membrane, ear canal and external ear normal. There is no impacted cerumen.     Left Ear: Tympanic membrane, ear canal and external ear normal. There is no impacted cerumen.  Cardiovascular:     Rate and Rhythm: Normal rate and regular rhythm.  Pulmonary:     Effort:  Pulmonary effort is normal.     Breath sounds: Normal breath sounds.  Musculoskeletal:     Comments: Left hip reveals good range of motion.  She has tenderness laterally over the greater trochanteric bursa region  Neurological:     Mental Status: She is alert.        Assessment:     #1 perennial allergic rhinitis with ongoing symptoms in spite of Astelin and Zyrtec.  She has also tried Flonase without much improvement.  #2 bilateral otalgia left greater than right.  Question eustachian tube dysfunction.  She does not have any signs of infection and no visible effusion  #3 left greater trochanteric bursitis     Plan:     -Add Singulair 10 mg once daily -Continue Astelin and Zyrtec -Discussed risk and benefits of steroid injection into the bursa region left lateral hip including risk of bleeding, bruising, infection and patient consented.  Skin prepped with Betadine.  Using sterile technique injected 80 mg of Depo-Medrol and 2 cc of plain Xylocaine using 1-1/2 inch 25-gauge needle.  Patient tolerated well.  Touch base if not improving over the next week following injection  [name] MD [facility]

## 2019-07-07 DIAGNOSIS — R69 Illness, unspecified: Secondary | ICD-10-CM | POA: Diagnosis not present

## 2019-07-14 ENCOUNTER — Encounter: Payer: Self-pay | Admitting: Family Medicine

## 2019-07-26 ENCOUNTER — Encounter: Payer: Self-pay | Admitting: Family Medicine

## 2019-08-03 ENCOUNTER — Ambulatory Visit: Payer: Medicare HMO

## 2019-08-15 ENCOUNTER — Ambulatory Visit (INDEPENDENT_AMBULATORY_CARE_PROVIDER_SITE_OTHER): Payer: Medicare HMO

## 2019-08-15 VITALS — BP 142/88 | Ht 66.0 in | Wt 185.0 lb

## 2019-08-15 DIAGNOSIS — Z0000 Encounter for general adult medical examination without abnormal findings: Secondary | ICD-10-CM

## 2019-08-15 NOTE — Patient Instructions (Addendum)
Rebecca Reid , Thank you for taking time to participate in your Medicare Wellness Visit. I appreciate your ongoing commitment to your health goals. Please review the following plan we discussed and let me know if I can assist you in the future.   Screening recommendations/referrals: Colorectal Screening: cologuard completed 06/28/2019; due again 06/27/2022. Mammogram: completed 04/09/2018; due again 04/09/2020. Bone Density: completed 05/26/2012; due again when you feel safe enough to complete. We will gladly send the referral when you are ready.  Vision and Dental Exams: Recommended annual ophthalmology exams for early detection of glaucoma and other disorders of the eye Recommended annual dental exams for proper oral hygiene  Diabetic Exams: Diabetic Eye Exam: N/A Diabetic Foot Exam: N/A  Vaccinations: Influenza vaccine: completed 03/29/2019; due again Fall 2021. Pneumococcal vaccine: completed 03/25/2016 & 04/06/2018; up to date at this time.  Tdap vaccine: completed 03/31/2019; due again 03/30/2029. Shingles vaccine: completed 08/10/2018 & 02/07/2019.   Advanced directives: Advance directives discussed with you today. Please bring a copy of your POA (Power of [place]) and/or Living Will to your next appointment.  Goals: Recommend to drink at least 6-8 8oz glasses of water per day.  Recommend to exercise for at least 150 minutes per week.  Recommend to remove any items from the home that may cause slips or trips.  Next appointment: Please schedule your Annual Wellness Visit with your Nurse Health Advisor in one year.  Preventive Care 44 Years and Older, Female Preventive care refers to lifestyle choices and visits with your health care provider that can promote health and wellness. What does preventive care include?  A yearly physical exam. This is also called an annual well check.  Dental exams once or twice a year.  Routine eye exams. Ask your health care provider how  often you should have your eyes checked.  Personal lifestyle choices, including:  Daily care of your teeth and gums.  Regular physical activity.  Eating a healthy diet.  Avoiding tobacco and drug use.  Limiting alcohol use.  Practicing safe sex.  Taking low-dose aspirin every day if recommended by your health care provider.  Taking vitamin and mineral supplements as recommended by your health care provider. What happens during an annual well check? The services and screenings done by your health care provider during your annual well check will depend on your age, overall health, lifestyle risk factors, and family history of disease. Counseling  Your health care provider may ask you questions about your:  Alcohol use.  Tobacco use.  Drug use.  Emotional well-being.  Home and relationship well-being.  Sexual activity.  Eating habits.  History of falls.  Memory and ability to understand (cognition).  Work and work Statistician.  Reproductive health. Screening  You may have the following tests or measurements:  Height, weight, and BMI.  Blood pressure.  Lipid and cholesterol levels. These may be checked every 5 years, or more frequently if you are over 67 years old.  Skin check.  Lung cancer screening. You may have this screening every year starting at age 80 if you have a 30-pack-year history of smoking and currently smoke or have quit within the past 15 years.  Fecal occult blood test (FOBT) of the stool. You may have this test every year starting at age 82.  Flexible sigmoidoscopy or colonoscopy. You may have a sigmoidoscopy every 5 years or a colonoscopy every 10 years starting at age 64.  Hepatitis C blood test.  Hepatitis B blood test.  Sexually transmitted  disease (STD) testing.  Diabetes screening. This is done by checking your blood sugar (glucose) after you have not eaten for a while (fasting). You may have this done every 1-3 years.  Bone  density scan. This is done to screen for osteoporosis. You may have this done starting at age 51.  Mammogram. This may be done every 1-2 years. Talk to your health care provider about how often you should have regular mammograms. Talk with your health care provider about your test results, treatment options, and if necessary, the need for more tests. Vaccines  Your health care provider may recommend certain vaccines, such as:  Influenza vaccine. This is recommended every year.  Tetanus, diphtheria, and acellular pertussis (Tdap, Td) vaccine. You may need a Td booster every 10 years.  Zoster vaccine. You may need this after age 56.  Pneumococcal 13-valent conjugate (PCV13) vaccine. One dose is recommended after age 45.  Pneumococcal polysaccharide (PPSV23) vaccine. One dose is recommended after age 87. Talk to your health care provider about which screenings and vaccines you need and how often you need them. This information is not intended to replace advice given to you by your health care provider. Make sure you discuss any questions you have with your health care provider. Document Released: 08/03/2015 Document Revised: 03/26/2016 Document Reviewed: 05/08/2015 Elsevier Interactive Patient Education  2017 [place] Prevention in the Home Falls can cause injuries. They can happen to people of all ages. There are many things you can do to make your home safe and to help prevent falls. What can I do on the outside of my home?  Regularly fix the edges of walkways and driveways and fix any cracks.  Remove anything that might make you trip as you walk through a door, such as a raised step or threshold.  Trim any bushes or trees on the path to your home.  Use bright outdoor lighting.  Clear any walking paths of anything that might make someone trip, such as rocks or tools.  Regularly check to see if handrails are loose or broken. Make sure that both sides of any steps have  handrails.  Any raised decks and porches should have guardrails on the edges.  Have any leaves, snow, or ice cleared regularly.  Use sand or salt on walking paths during winter.  Clean up any spills in your garage right away. This includes oil or grease spills. What can I do in the bathroom?  Use night lights.  Install grab bars by the toilet and in the tub and shower. Do not use towel bars as grab bars.  Use non-skid mats or decals in the tub or shower.  If you need to sit down in the shower, use a plastic, non-slip stool.  Keep the floor dry. Clean up any water that spills on the floor as soon as it happens.  Remove soap buildup in the tub or shower regularly.  Attach bath mats securely with double-sided non-slip rug tape.  Do not have throw rugs and other things on the floor that can make you trip. What can I do in the bedroom?  Use night lights.  Make sure that you have a light by your bed that is easy to reach.  Do not use any sheets or blankets that are too big for your bed. They should not hang down onto the floor.  Have a firm chair that has side arms. You can use this for support while you get dressed.  Do not have throw rugs and other things on the floor that can make you trip. What can I do in the kitchen?  Clean up any spills right away.  Avoid walking on wet floors.  Keep items that you use a lot in easy-to-reach places.  If you need to reach something above you, use a strong step stool that has a grab bar.  Keep electrical cords out of the way.  Do not use floor polish or wax that makes floors slippery. If you must use wax, use non-skid floor wax.  Do not have throw rugs and other things on the floor that can make you trip. What can I do with my stairs?  Do not leave any items on the stairs.  Make sure that there are handrails on both sides of the stairs and use them. Fix handrails that are broken or loose. Make sure that handrails are as long as  the stairways.  Check any carpeting to make sure that it is firmly attached to the stairs. Fix any carpet that is loose or worn.  Avoid having throw rugs at the top or bottom of the stairs. If you do have throw rugs, attach them to the floor with carpet tape.  Make sure that you have a light switch at the top of the stairs and the bottom of the stairs. If you do not have them, ask someone to add them for you. What else can I do to help prevent falls?  Wear shoes that:  Do not have high heels.  Have rubber bottoms.  Are comfortable and fit you well.  Are closed at the toe. Do not wear sandals.  If you use a stepladder:  Make sure that it is fully opened. Do not climb a closed stepladder.  Make sure that both sides of the stepladder are locked into place.  Ask someone to hold it for you, if possible.  Clearly mark and make sure that you can see:  Any grab bars or handrails.  First and last steps.  Where the edge of each step is.  Use tools that help you move around (mobility aids) if they are needed. These include:  Canes.  Walkers.  Scooters.  Crutches.  Turn on the lights when you go into a dark area. Replace any light bulbs as soon as they burn out.  Set up your furniture so you have a clear path. Avoid moving your furniture around.  If any of your floors are uneven, fix them.  If there are any pets around you, be aware of where they are.  Review your medicines with your doctor. Some medicines can make you feel dizzy. This can increase your chance of falling. Ask your doctor what other things that you can do to help prevent falls. This information is not intended to replace advice given to you by your health care provider. Make sure you discuss any questions you have with your health care provider. Document Released: 05/03/2009 Document Revised: 12/13/2015 Document Reviewed: 08/11/2014 Elsevier Interactive Patient Education  2017 [employer].

## 2019-08-15 NOTE — Progress Notes (Signed)
This visit is being conducted via phone call due to the COVID-19 pandemic. This patient has given me verbal consent via phone to conduct this visit, patient states they are participating from their home address. Some vital signs may be absent or patient reported.   Patient identification: identified by name, DOB, and current address.  Location provider: [facility], Office Persons participating in the virtual visit: Mrs. Judene Gahn and Franne Forts, LPN.    Subjective:   Rebecca Reid is a 74 y.o. female who presents for Medicare Annual (Subsequent) preventive examination.  Review of Systems:  NO ROS; Annual Medicare Wellness Visit  Cardiac Risk Factors include: advanced age (>7men, >26 women)    Objective:     Vitals: BP (!) 142/88 Comment: prior to taking BP medication  Ht 5\' 6"  (1.676 m)   Wt 185 lb (83.9 kg)   BMI 29.86 kg/m   Body mass index is 29.86 kg/m.  Advanced Directives 08/15/2019 04/19/2018 04/15/2018 11/02/2015  Does Patient Have a Medical Advance Directive? Yes Yes Yes Yes  Type of Paramedic of [place];Living will - [place];Living will Living will;Healthcare Power of Attorney  Does patient want to make changes to medical advance directive? No - Patient declined - - No - Patient declined  Copy of [place] in Chart? No - copy requested - No - copy requested No - copy requested    Tobacco Social History   Tobacco Use  Smoking Status Never Smoker  Smokeless Tobacco Never Used     Counseling given: Not Answered   Clinical Intake:  Pre-visit preparation completed: Yes  Pain : No/denies pain     BMI - recorded: 29.86 Nutritional Status: BMI 25 -29 Overweight Nutritional Risks: None Diabetes: No  How often do you need to have someone help you when you read instructions, pamphlets, or other written materials from your doctor or pharmacy?: 1 - Never What is the last grade level you  completed in school?: some college  Interpreter Needed?: No  Information entered by :: Franne Forts, LPN.  Past Medical History:  Diagnosis Date  . ALLERGIC RHINITIS CAUSE UNSPECIFIED 04/30/2009  . Anxiety   . DEPRESSION, HX OF 04/11/2010  . Dysuria 05/06/2010  . Gallstones   . GERD 04/30/2009  . HYPERLIPIDEMIA 04/30/2009  . HYPERTENSION 04/30/2009  . LEG EDEMA 12/12/2009  . OSTEOPENIA 04/30/2009  . PVC (premature ventricular contraction)   . Status post dilation of esophageal narrowing    Past Surgical History:  Procedure Laterality Date  . ABDOMINAL HYSTERECTOMY  1986  . AUGMENTATION MAMMAPLASTY    . BREAST ENHANCEMENT SURGERY  1987  . BREAST SURGERY  1974   biopsy  . CARDIAC CATHETERIZATION N/A 11/02/2015   Procedure: Left Heart Cath and Coronary Angiography;  Surgeon: Belva Crome, MD;  Location: [facility];  Service: Cardiovascular;  Laterality: N/A;  . CATARACT EXTRACTION, BILATERAL Bilateral    one in feb and one in March Dr. Tommy Rainwater  . CHOLECYSTECTOMY  1974  . ectopic pregnancy    . OTHER SURGICAL HISTORY     Stomach was not developed at birth   Family History  Problem Relation Age of Onset  . Emphysema Mother        heavy smoker  . Heart disease Mother 34  . Hyperlipidemia Mother   . Hypertension Mother   . Skin cancer Mother        melanomia, spread to her spine  . Arthritis  Mother   . Heart disease Father 75  . Prostate cancer Father   . Hyperlipidemia Father   . Hypertension Father   . Colon cancer Neg Hx   . Esophageal cancer Neg Hx   . Rectal cancer Neg Hx   . Breast cancer Neg Hx    Social History   Socioeconomic History  . Marital status: Married    Spouse name: Not on file  . Number of children: 3  . Years of education: 81   . Highest education level: Some college, no degree  Occupational History  . Occupation: retired  Tobacco Use  . Smoking status: Never Smoker  . Smokeless tobacco: Never Used  Substance and Sexual  Activity  . Alcohol use: Yes    Comment: social  . Drug use: Never  . Sexual activity: Not on file  Other Topics Concern  . Not on file  Social History Narrative   Married   [place] 2   Retired    Scientist, physiological Strain: [place]   . Difficulty of Paying Living Expenses: Not hard at all  Food Insecurity: No Food Insecurity  . Worried About Charity fundraiser in the Last Year: Never true  . Ran Out of Food in the Last Year: Never true  Transportation Needs: No Transportation Needs  . Lack of Transportation (Medical): No  . Lack of Transportation (Non-Medical): No  Physical Activity: Inactive  . Days of Exercise per Week: 0 days  . Minutes of Exercise per Session: 0 min  Stress:   . Feeling of Stress : Not on file  Social Connections: Not Isolated  . Frequency of Communication with Friends and Family: More than three times a week  . Frequency of Social Gatherings with Friends and Family: Once a week  . Attends Religious Services: More than 4 times per year  . Active Member of Clubs or Organizations: Yes  . Attends Archivist Meetings: More than 4 times per year  . Marital Status: Married    Outpatient Encounter Medications as of 08/15/2019  Medication Sig  . aspirin 81 MG tablet Take 81 mg by mouth daily.    Marland Kitchen azelastine (ASTELIN) 0.1 % nasal spray Place 2 sprays into both nostrils 2 (two) times daily. Use in each nostril as directed  . cetirizine (ZYRTEC) 5 MG tablet Take 5 mg by mouth as needed for allergies.  . cholecalciferol (VITAMIN D) 1000 units tablet Take 5,000 Units by mouth daily.   . diclofenac sodium (VOLTAREN) 1 % GEL Apply 2 g topically 4 (four) times daily.  . Ibuprofen-diphenhydrAMINE Cit (ADVIL PM PO) Take 1 tablet by mouth at bedtime.  Marland Kitchen losartan-hydrochlorothiazide (HYZAAR) 100-25 MG tablet TAKE 1/2 TABLET BY MOUTH DAILY  . metoprolol succinate (TOPROL-XL) 50 MG 24 hr tablet TAKE ONE TABLET BY MOUTH DAILY WITH OR  IMMEDIATELY FOLLOWING FOOD  . metroNIDAZOLE (METROGEL) 0.75 % gel Apply 1 application topically 2 (two) times daily.  . montelukast (SINGULAIR) 10 MG tablet Take 1 tablet (10 mg total) by mouth at bedtime.  Marland Kitchen omeprazole (PRILOSEC) 40 MG capsule Take 80 mg by mouth 2 (two) times daily.   . sertraline (ZOLOFT) 100 MG tablet Take 1 tablet (100 mg total) by mouth daily.  Marland Kitchen amoxicillin-clavulanate (AUGMENTIN) 875-125 MG tablet Take 1 tablet by mouth 2 (two) times daily. (Patient not taking: Reported on 08/15/2019)  . [DISCONTINUED] acetaminophen (TYLENOL) 500 MG tablet Take 500 mg by mouth as needed.  . [  DISCONTINUED] ondansetron (ZOFRAN) 4 MG tablet Take 1 tablet (4 mg total) by mouth every 8 (eight) hours as needed for nausea or vomiting.   No facility-administered encounter medications on file as of 08/15/2019.    Activities of Daily Living In your present state of health, do you have any difficulty performing the following activities: 08/15/2019  Hearing? N  Vision? N  Difficulty concentrating or making decisions? N  Walking or climbing stairs? N  Dressing or bathing? N  Doing errands, shopping? N  Preparing Food and eating ? N  Using the Toilet? N  In the past six months, have you accidently leaked urine? N  Do you have problems with loss of bowel control? N  Managing your Medications? N  Managing your Finances? N  Housekeeping or managing your Housekeeping? N  Some recent data might be hidden    Patient Care Team: [name], MD as PCP - General    Assessment:   This is a routine wellness examination for Naija.  Exercise Activities and Dietary recommendations Current Exercise Habits: The patient does not participate in regular exercise at present, Exercise limited by: None identified  Goals    . Avoid Covid    . Exercise 150 min/wk Moderate Activity       Fall Risk Fall Risk  08/15/2019 04/06/2018 12/12/2016 03/25/2016 09/12/2015  Falls in the past year? 0 Yes Yes No No   Number falls in past yr: - 2 or more 2 or more - -  Injury with Fall? - Yes Yes - -  Comment - Minor concussion, 07/2017 - - -  Risk for fall due to : Medication side effect - - - -  Follow up Falls evaluation completed;Education provided;Falls prevention discussed - - - -   Is the patient's home free of loose throw rugs in walkways, pet beds, electrical cords, etc?   yes      Grab bars in the bathroom? yes      Handrails on the stairs?   yes      Adequate lighting?   yes  Timed Get Up and Go performed: N/A due to telephone visit  Depression Screen PHQ 2/9 Scores 08/15/2019 04/19/2018 04/06/2018 03/25/2016  PHQ - 2 Score 0 0 0 0  PHQ- 9 Score - - 1 -     Cognitive Function MMSE - Mini Mental State Exam 04/19/2018  Not completed: (No Data)     6CIT Screen 08/15/2019  What Year? 0 points  What month? 0 points  What time? 0 points  Count back from 20 0 points  Months in reverse 0 points  Repeat phrase 0 points  Total Score 0    Immunization History  Administered Date(s) Administered  . Fluad Quad(high Dose 65+) 03/29/2019  . Influenza Split 05/16/2011, 03/31/2012  . Influenza Whole 04/11/2010  . Influenza, High Dose Seasonal PF 03/25/2016, 03/24/2017, 04/06/2018  . Influenza,inj,Quad PF,6+ Mos 03/31/2013, 04/16/2015  . Influenza-Unspecified 05/04/2014  . Pneumococcal Conjugate-13 03/31/2012, 03/25/2016  . Pneumococcal Polysaccharide-23 04/06/2018  . Td 04/20/1989, 04/30/2009  . Tdap 03/31/2019  . Zoster 11/04/2012  . Zoster Recombinat (Shingrix) 08/10/2018, 02/07/2019    Qualifies for Shingles Vaccine?Patient has already completed  Screening Tests Health Maintenance  Topic Date Due  . MAMMOGRAM  04/09/2020  . Fecal DNA (Cologuard)  06/27/2022  . TETANUS/TDAP  03/30/2029  . INFLUENZA VACCINE  Completed  . DEXA SCAN  Completed  . Hepatitis C Screening  Completed  . PNA vac Low Risk Adult  Completed    Cancer Screenings: Lung: Low Dose CT Chest recommended if Age  74-80 years, 30 pack-year currently smoking OR have quit w/in 15years. Patient does not qualify. Breast:  Up to date on Mammogram? Yes   Up to date of Bone Density/Dexa? No Colorectal: yes  Additional Screenings:  Hepatitis C Screening: completed 02/07/2019.      Plan:   Patient is agreeable to another DEXA scan after pandemic settles down but declines to do until then. She is doing well at this time. Very concerned about her husband's risk of covid due to his COPD and heart disease.   I have personally reviewed and noted the following in the patient's chart:   . Medical and social history . Use of alcohol, tobacco or illicit drugs  . Current medications and supplements . Functional ability and status . Nutritional status . Physical activity . Advanced directives . List of other physicians . Hospitalizations, surgeries, and ER visits in previous 12 months . Vitals . Screenings to include cognitive, depression, and falls . Referrals and appointments  In addition, I have reviewed and discussed with patient certain preventive protocols, quality metrics, and best practice recommendations. A written personalized care plan for preventive services as well as general preventive health recommendations were provided to patient.     Franne Forts, LPN  [id number]

## 2019-08-16 ENCOUNTER — Encounter: Payer: Self-pay | Admitting: Family Medicine

## 2019-08-21 ENCOUNTER — Ambulatory Visit: Payer: Medicare HMO

## 2019-08-24 ENCOUNTER — Other Ambulatory Visit: Payer: Self-pay | Admitting: Family Medicine

## 2019-08-26 ENCOUNTER — Ambulatory Visit: Payer: Medicare HMO

## 2019-08-26 ENCOUNTER — Other Ambulatory Visit: Payer: Self-pay

## 2019-08-28 ENCOUNTER — Ambulatory Visit: Payer: Medicare HMO | Attending: Internal Medicine

## 2019-08-28 DIAGNOSIS — Z23 Encounter for immunization: Secondary | ICD-10-CM | POA: Insufficient documentation

## 2019-08-28 NOTE — Progress Notes (Signed)
   Covid-19 Vaccination Clinic  Name:  Rebecca Reid    MRN: [id number] DOB: 06-30-1946  08/28/2019  Rebecca Reid was observed post Covid-19 immunization for 15 minutes without incidence. She was provided with Vaccine Information Sheet and instruction to access the V-Safe system.   Rebecca Reid was instructed to call 911 with any severe reactions post vaccine: Marland Kitchen Difficulty breathing  . Swelling of your face and throat  . A fast heartbeat  . A bad rash all over your body  . Dizziness and weakness    Immunizations Administered    Name Date Dose VIS Date Route   Pfizer COVID-19 Vaccine 08/28/2019  9:29 AM 0.3 mL 07/01/2019 Intramuscular   Manufacturer: [place]   Lot: [id number]   [place]: [id number]

## 2019-08-29 ENCOUNTER — Encounter: Payer: Self-pay | Admitting: Family Medicine

## 2019-08-29 ENCOUNTER — Ambulatory Visit (INDEPENDENT_AMBULATORY_CARE_PROVIDER_SITE_OTHER): Payer: Medicare HMO | Admitting: Family Medicine

## 2019-08-29 ENCOUNTER — Other Ambulatory Visit: Payer: Self-pay

## 2019-08-29 VITALS — BP 120/80 | HR 64 | Temp 97.6°F | Ht 65.75 in | Wt 183.9 lb

## 2019-08-29 DIAGNOSIS — Z0000 Encounter for general adult medical examination without abnormal findings: Secondary | ICD-10-CM | POA: Diagnosis not present

## 2019-08-29 LAB — CBC WITH DIFFERENTIAL/PLATELET
Basophils Absolute: 0.1 10*3/uL (ref 0.0–0.1)
Basophils Relative: 0.7 % (ref 0.0–3.0)
Eosinophils Absolute: 0.1 10*3/uL (ref 0.0–0.7)
Eosinophils Relative: 1.7 % (ref 0.0–5.0)
HCT: 40.1 % (ref 36.0–46.0)
Hemoglobin: 13.3 g/dL (ref 12.0–15.0)
Lymphocytes Relative: 21 % (ref 12.0–46.0)
Lymphs Abs: 1.5 10*3/uL (ref 0.7–4.0)
MCHC: 33.2 g/dL (ref 30.0–36.0)
MCV: 82.8 fl (ref 78.0–100.0)
Monocytes Absolute: 0.6 10*3/uL (ref 0.1–1.0)
Monocytes Relative: 7.9 % (ref 3.0–12.0)
Neutro Abs: 5 10*3/uL (ref 1.4–7.7)
Neutrophils Relative %: 68.7 % (ref 43.0–77.0)
Platelets: 210 10*3/uL (ref 150.0–400.0)
RBC: 4.85 Mil/uL (ref 3.87–5.11)
RDW: 15.6 % — ABNORMAL HIGH (ref 11.5–15.5)
WBC: 7.3 10*3/uL (ref 4.0–10.5)

## 2019-08-29 LAB — BASIC METABOLIC PANEL
BUN: 25 mg/dL — ABNORMAL HIGH (ref 6–23)
CO2: 29 mEq/L (ref 19–32)
Calcium: 9.5 mg/dL (ref 8.4–10.5)
Chloride: 101 mEq/L (ref 96–112)
Creatinine, Ser: 0.84 mg/dL (ref 0.40–1.20)
GFR: 66.54 mL/min (ref >60.00)
Glucose, Bld: 92 mg/dL (ref 70–99)
Potassium: 3.7 mEq/L (ref 3.5–5.1)
Sodium: 138 mEq/L (ref 135–145)

## 2019-08-29 LAB — LIPID PANEL
Cholesterol: 236 mg/dL — ABNORMAL HIGH (ref 0–200)
HDL: 67.4 mg/dL (ref >39.00)
LDL Cholesterol: 151 mg/dL — ABNORMAL HIGH (ref 0–99)
NonHDL: 169.05
Total CHOL/HDL Ratio: 4
Triglycerides: 88 mg/dL (ref 0.0–149.0)
VLDL: 17.6 mg/dL (ref 0.0–40.0)

## 2019-08-29 LAB — HEPATIC FUNCTION PANEL
ALT: 14 U/L (ref 0–35)
AST: 16 U/L (ref 0–37)
Albumin: 4.1 g/dL (ref 3.5–5.2)
Alkaline Phosphatase: 75 U/L (ref 39–117)
Bilirubin, Direct: 0.1 mg/dL (ref 0.0–0.3)
Total Bilirubin: 0.8 mg/dL (ref 0.2–1.2)
Total Protein: 6.5 g/dL (ref 6.0–8.3)

## 2019-08-29 LAB — TSH: TSH: 2.13 u[IU]/mL (ref 0.35–4.50)

## 2019-08-29 NOTE — Progress Notes (Deleted)
  Subjective:     Patient ID: Rebecca Reid, female   DOB: [birth year], 74 y.o.   MRN: [id number]  HPI   Review of Systems     Objective:   Physical Exam     Assessment:     ***    Plan:     ***

## 2019-08-29 NOTE — Patient Instructions (Signed)
Preventive Care 73 Years and Older, Female Preventive care refers to lifestyle choices and visits with your health care provider that can promote health and wellness. This includes:  A yearly physical exam. This is also called an annual well check.  Regular dental and eye exams.  Immunizations.  Screening for certain conditions.  Healthy lifestyle choices, such as diet and exercise. What can I expect for my preventive care visit? Physical exam Your health care provider will check:  Height and weight. These may be used to calculate body mass index (BMI), which is a measurement that tells if you are at a healthy weight.  Heart rate and blood pressure.  Your skin for abnormal spots. Counseling Your health care provider may ask you questions about:  Alcohol, tobacco, and drug use.  Emotional well-being.  Home and relationship well-being.  Sexual activity.  Eating habits.  History of falls.  Memory and ability to understand (cognition).  Work and work Statistician.  Pregnancy and menstrual history. What immunizations do I need?  Influenza (flu) vaccine  This is recommended every year. Tetanus, diphtheria, and pertussis (Tdap) vaccine  You may need a Td booster every 10 years. Varicella (chickenpox) vaccine  You may need this vaccine if you have not already been vaccinated. Zoster (shingles) vaccine  You may need this after age 33. Pneumococcal conjugate (PCV13) vaccine  One dose is recommended after age 33. Pneumococcal polysaccharide (PPSV23) vaccine  One dose is recommended after age 72. Measles, mumps, and rubella (MMR) vaccine  You may need at least one dose of MMR if you were born in 1957 or later. You may also need a second dose. Meningococcal conjugate (MenACWY) vaccine  You may need this if you have certain conditions. Hepatitis A vaccine  You may need this if you have certain conditions or if you travel or work in places where you may be exposed  to hepatitis A. Hepatitis B vaccine  You may need this if you have certain conditions or if you travel or work in places where you may be exposed to hepatitis B. Haemophilus influenzae type b (Hib) vaccine  You may need this if you have certain conditions. You may receive vaccines as individual doses or as more than one vaccine together in one shot (combination vaccines). Talk with your health care provider about the risks and benefits of combination vaccines. What tests do I need? Blood tests  Lipid and cholesterol levels. These may be checked every 5 years, or more frequently depending on your overall health.  Hepatitis C test.  Hepatitis B test. Screening  Lung cancer screening. You may have this screening every year starting at age 39 if you have a 30-pack-year history of smoking and currently smoke or have quit within the past 15 years.  Colorectal cancer screening. All adults should have this screening starting at age 36 and continuing until age 15. Your health care provider may recommend screening at age 23 if you are at increased risk. You will have tests every 1-10 years, depending on your results and the type of screening test.  Diabetes screening. This is done by checking your blood sugar (glucose) after you have not eaten for a while (fasting). You may have this done every 1-3 years.  Mammogram. This may be done every 1-2 years. Talk with your health care provider about how often you should have regular mammograms.  BRCA-related cancer screening. This may be done if you have a family history of breast, ovarian, tubal, or peritoneal cancers.  Other tests  Sexually transmitted disease (STD) testing.  Bone density scan. This is done to screen for osteoporosis. You may have this done starting at age 22. Follow these instructions at home: Eating and drinking  Eat a diet that includes fresh fruits and vegetables, whole grains, lean protein, and low-fat dairy products. Limit  your intake of foods with high amounts of sugar, saturated fats, and salt.  Take vitamin and mineral supplements as recommended by your health care provider.  Do not drink alcohol if your health care provider tells you not to drink.  If you drink alcohol: ? Limit how much you have to 0-1 drink a day. ? Be aware of how much alcohol is in your drink. In the [place] one drink equals one 12 oz bottle of beer (355 mL), one 5 oz glass of wine (148 mL), or one 1 oz glass of hard liquor (44 mL). Lifestyle  Take daily care of your teeth and gums.  Stay active. Exercise for at least 30 minutes on 5 or more days each week.  Do not use any products that contain nicotine or tobacco, such as cigarettes, e-cigarettes, and chewing tobacco. If you need help quitting, ask your health care provider.  If you are sexually active, practice safe sex. Use a condom or other form of protection in order to prevent STIs (sexually transmitted infections).  Talk with your health care provider about taking a low-dose aspirin or statin. What's next?  Go to your health care provider once a year for a well check visit.  Ask your health care provider how often you should have your eyes and teeth checked.  Stay up to date on all vaccines. This information is not intended to replace advice given to you by your health care provider. Make sure you discuss any questions you have with your health care provider. Document Revised: 07/01/2018 Document Reviewed: 07/01/2018 Elsevier Patient Education  [place].  Consider DEXA scan with mammogram next Fall.

## 2019-08-29 NOTE — Progress Notes (Addendum)
Subjective:     Patient ID: Rebecca Reid, female   DOB: 06-Jul-1946, 74 y.o.   MRN: [id number]  HPI   Salimah is seen for physical exam.  She has history of hypertension, GERD, osteopenia, history of recurrent depression, dyslipidemia  Health maintenance reviewed.  She had flu vaccine back in September.  Pneumonia vaccines up-to-date.  Previous hepatitis C screening negative.  Last DEXA scan 2013.  Tetanus due 2030.  She had Cologuard in December which was negative.  Mammogram September 2019 and she is getting these every 2 years  She had recent Medicare wellness visit.  No recent falls.  She feels her depression is stable.  Past Medical History:  Diagnosis Date  . ALLERGIC RHINITIS CAUSE UNSPECIFIED 04/30/2009  . Anxiety   . DEPRESSION, HX OF 04/11/2010  . Dysuria 05/06/2010  . Gallstones   . GERD 04/30/2009  . HYPERLIPIDEMIA 04/30/2009  . HYPERTENSION 04/30/2009  . LEG EDEMA 12/12/2009  . OSTEOPENIA 04/30/2009  . PVC (premature ventricular contraction)   . Status post dilation of esophageal narrowing    Past Surgical History:  Procedure Laterality Date  . ABDOMINAL HYSTERECTOMY  1986  . AUGMENTATION MAMMAPLASTY    . BREAST ENHANCEMENT SURGERY  1987  . BREAST SURGERY  1974   biopsy  . CARDIAC CATHETERIZATION N/A 11/02/2015   Procedure: Left Heart Cath and Coronary Angiography;  Surgeon: Belva Crome, MD;  Location: [facility];  Service: Cardiovascular;  Laterality: N/A;  . CATARACT EXTRACTION, BILATERAL Bilateral    one in feb and one in March Dr. Tommy Rainwater  . CHOLECYSTECTOMY  1974  . ectopic pregnancy    . OTHER SURGICAL HISTORY     Stomach was not developed at birth    reports that she has never smoked. She has never used smokeless tobacco. She reports current alcohol use. She reports that she does not use drugs. family history includes Arthritis in her mother; Emphysema in her mother; Heart disease (age of onset: 31) in her father; Heart disease (age of onset: 24)  in her mother; Hyperlipidemia in her father and mother; Hypertension in her father and mother; Prostate cancer in her father; Skin cancer in her mother. Allergies  Allergen Reactions  . Erythromycin Base Other (See Comments)    yeast infection, GI upset  . Naproxen Other (See Comments)    GI upset  . Prednisone Hives    REACTION: hives, anxiety, insomnia  . Scallops [Shellfish Allergy] Nausea And Vomiting   Wt Readings from Last 3 Encounters:  08/29/19 183 lb 14.4 oz (83.4 kg)  08/15/19 185 lb (83.9 kg)  07/06/19 184 lb (83.5 kg)   The 10-year ASCVD risk score Mikey Bussing DC Jr., et al., 2013) is: 13.9%   Values used to calculate the score:     Age: 2 years     Sex: Female     Is Non-Hispanic African American: No     Diabetic: No     Tobacco smoker: No     Systolic Blood Pressure: 123456 mmHg     Is BP treated: Yes     HDL Cholesterol: 67.4 mg/dL     Total Cholesterol: 236 mg/dL     Review of Systems  Constitutional: Negative for activity change, appetite change, fatigue, fever and unexpected weight change.  HENT: Negative for ear pain, hearing loss, sore throat and trouble swallowing.   Eyes: Negative for visual disturbance.  Respiratory: Negative for cough and shortness of breath.   Cardiovascular: Negative for chest  pain and palpitations.  Gastrointestinal: Negative for abdominal pain, blood in stool, constipation and diarrhea.  Endocrine: Negative for polydipsia and polyuria.  Genitourinary: Negative for dysuria and hematuria.  Musculoskeletal: Negative for arthralgias, back pain and myalgias.  Skin: Negative for rash.  Neurological: Negative for dizziness, syncope, weakness and headaches.  Hematological: Negative for adenopathy.  Psychiatric/Behavioral: Negative for confusion and dysphoric mood.       Objective:   Physical Exam Constitutional:      Appearance: She is well-developed.  HENT:     Head: Normocephalic and atraumatic.     Right Ear: Tympanic membrane  normal.     Left Ear: Tympanic membrane normal.  Eyes:     Pupils: Pupils are equal, round, and reactive to light.  Neck:     Thyroid: No thyromegaly.  Cardiovascular:     Rate and Rhythm: Normal rate and regular rhythm.     Heart sounds: Normal heart sounds.  Pulmonary:     Effort: No respiratory distress.     Breath sounds: Normal breath sounds. No wheezing or rales.  Abdominal:     General: Bowel sounds are normal. There is no distension.     Palpations: Abdomen is soft. There is no mass.     Tenderness: There is no abdominal tenderness. There is no guarding or rebound.  Musculoskeletal:     Cervical back: Normal range of motion and neck supple.     Right lower leg: No edema.     Left lower leg: No edema.  Lymphadenopathy:     Cervical: No cervical adenopathy.  Skin:    Findings: No rash.  Neurological:     Mental Status: She is alert and oriented to person, place, and time.     Cranial Nerves: No cranial nerve deficit.  Psychiatric:        Behavior: Behavior normal.        Thought Content: Thought content normal.        Judgment: Judgment normal.        Assessment:     Physical exam.  Patient has hypertension which is well controlled.  We discussed and reviewed the following health maintenance issues    Plan:     -She wishes to continue with every 2-year mammograms and will get repeat this September -We discussed getting follow-up DEXA scan.  At this point she is undecided but will consider -Continue regular weightbearing exercise such as walking -Obtain follow-up labs -Recent negative Cologuard as above -She is in process of getting Covid vaccination had her first just last week and had no side effects  [name] MD [facility] at [facility]

## 2019-08-30 ENCOUNTER — Other Ambulatory Visit: Payer: Self-pay | Admitting: *Deleted

## 2019-08-30 DIAGNOSIS — E785 Hyperlipidemia, unspecified: Secondary | ICD-10-CM

## 2019-09-21 ENCOUNTER — Ambulatory Visit: Payer: Medicare HMO | Attending: Internal Medicine

## 2019-09-21 DIAGNOSIS — Z23 Encounter for immunization: Secondary | ICD-10-CM

## 2019-09-21 NOTE — Progress Notes (Signed)
   Covid-19 Vaccination Clinic  Name:  Rebecca Reid    MRN: [id number] DOB: 1945-11-01  09/21/2019  [name] was observed post Covid-19 immunization for 15 minutes without incident. She was provided with Vaccine Information Sheet and instruction to access the V-Safe system.   [name] was instructed to call 911 with any severe reactions post vaccine: Marland Kitchen Difficulty breathing  . Swelling of face and throat  . A fast heartbeat  . A bad rash all over body  . Dizziness and weakness   Immunizations Administered    Name Date Dose VIS Date Route   Pfizer COVID-19 Vaccine 09/21/2019  3:58 PM 0.3 mL 07/01/2019 Intramuscular   Manufacturer: [place]   Lot: [id number]   Seba Dalkai: [id number]

## 2019-09-23 ENCOUNTER — Other Ambulatory Visit: Payer: Self-pay | Admitting: Family Medicine

## 2019-10-17 DIAGNOSIS — H02402 Unspecified ptosis of left eyelid: Secondary | ICD-10-CM | POA: Diagnosis not present

## 2019-10-17 DIAGNOSIS — H2512 Age-related nuclear cataract, left eye: Secondary | ICD-10-CM | POA: Diagnosis not present

## 2019-10-17 DIAGNOSIS — H2511 Age-related nuclear cataract, right eye: Secondary | ICD-10-CM | POA: Diagnosis not present

## 2019-10-26 ENCOUNTER — Other Ambulatory Visit: Payer: Self-pay | Admitting: Family Medicine

## 2019-10-26 DIAGNOSIS — Z1231 Encounter for screening mammogram for malignant neoplasm of breast: Secondary | ICD-10-CM

## 2019-10-31 ENCOUNTER — Other Ambulatory Visit: Payer: Self-pay

## 2019-11-01 ENCOUNTER — Ambulatory Visit (INDEPENDENT_AMBULATORY_CARE_PROVIDER_SITE_OTHER): Payer: Medicare HMO | Admitting: Family Medicine

## 2019-11-01 ENCOUNTER — Ambulatory Visit (INDEPENDENT_AMBULATORY_CARE_PROVIDER_SITE_OTHER): Payer: Medicare HMO

## 2019-11-01 ENCOUNTER — Encounter: Payer: Self-pay | Admitting: Family Medicine

## 2019-11-01 VITALS — BP 118/78 | HR 63 | Temp 97.9°F | Wt 181.4 lb

## 2019-11-01 DIAGNOSIS — M25512 Pain in left shoulder: Secondary | ICD-10-CM

## 2019-11-01 DIAGNOSIS — M25552 Pain in left hip: Secondary | ICD-10-CM

## 2019-11-01 DIAGNOSIS — M7062 Trochanteric bursitis, left hip: Secondary | ICD-10-CM | POA: Diagnosis not present

## 2019-11-01 DIAGNOSIS — S4992XA Unspecified injury of left shoulder and upper arm, initial encounter: Secondary | ICD-10-CM | POA: Diagnosis not present

## 2019-11-01 DIAGNOSIS — S79912A Unspecified injury of left hip, initial encounter: Secondary | ICD-10-CM | POA: Diagnosis not present

## 2019-11-01 NOTE — Patient Instructions (Signed)
Consider icing to left lateral hip 20 minutes 2-3 times daily  We will be in touch after x-rays over-read, but no fractures seen.

## 2019-11-01 NOTE — Progress Notes (Signed)
Subjective:     Patient ID: Rebecca Reid, female   DOB: 04/17/1946, 74 y.o.   MRN: [id number]  HPI Ms. Deupree is seen following a fall which occurred last Tuesday.  She was using a leaf blower and hit a garden hose guide and she lost her balance and fell landing on concrete on her left shoulder and left hip.  She has some bruising of the left lateral arm and some left proximal humerus pain.  She is able to go through full range of motion left hip.  No neck pain.  There was no head injury.  No loss of consciousness.  Ambulating without difficulty.  She also noticed some left lateral hip pain.  Occasional radiation medially.  No major pain with weightbearing.  She has tenderness over the bursa region and has had bursitis issues greater trochanteric bursa in the past.  Denies any upper extremity injuries.  She does feel she was having some exacerbation of her bursitis in left hip even prior to the fall but especially worse since then.  She has not gotten much relief with heat or ice previously  Past Medical History:  Diagnosis Date  . ALLERGIC RHINITIS CAUSE UNSPECIFIED 04/30/2009  . Anxiety   . DEPRESSION, HX OF 04/11/2010  . Dysuria 05/06/2010  . Gallstones   . GERD 04/30/2009  . HYPERLIPIDEMIA 04/30/2009  . HYPERTENSION 04/30/2009  . LEG EDEMA 12/12/2009  . OSTEOPENIA 04/30/2009  . PVC (premature ventricular contraction)   . Status post dilation of esophageal narrowing    Past Surgical History:  Procedure Laterality Date  . ABDOMINAL HYSTERECTOMY  1986  . AUGMENTATION MAMMAPLASTY    . BREAST ENHANCEMENT SURGERY  1987  . BREAST SURGERY  1974   biopsy  . CARDIAC CATHETERIZATION N/A 11/02/2015   Procedure: Left Heart Cath and Coronary Angiography;  Surgeon: Belva Crome, MD;  Location: [facility];  Service: Cardiovascular;  Laterality: N/A;  . CATARACT EXTRACTION, BILATERAL Bilateral    one in feb and one in March Dr. Tommy Rainwater  . CHOLECYSTECTOMY  1974  . ectopic pregnancy     . OTHER SURGICAL HISTORY     Stomach was not developed at birth    reports that she has never smoked. She has never used smokeless tobacco. She reports current alcohol use. She reports that she does not use drugs. family history includes Arthritis in her mother; Emphysema in her mother; Heart disease (age of onset: 15) in her father; Heart disease (age of onset: 96) in her mother; Hyperlipidemia in her father and mother; Hypertension in her father and mother; Prostate cancer in her father; Skin cancer in her mother. Allergies  Allergen Reactions  . Erythromycin Base Other (See Comments)    yeast infection, GI upset  . Naproxen Other (See Comments)    GI upset  . Prednisone Hives    REACTION: hives, anxiety, insomnia  . Scallops [Shellfish Allergy] Nausea And Vomiting     Review of Systems  Constitutional: Negative for chills and fever.  Respiratory: Negative for shortness of breath.   Cardiovascular: Negative for chest pain.  Musculoskeletal: Negative for back pain, neck pain and neck stiffness.  Neurological: Negative for weakness and headaches.  Psychiatric/Behavioral: Negative for confusion.       Objective:   Physical Exam Vitals reviewed.  Constitutional:      Appearance: Normal appearance.  Cardiovascular:     Rate and Rhythm: Normal rate and regular rhythm.  Pulmonary:     Effort: Pulmonary  effort is normal.     Breath sounds: Normal breath sounds.  Musculoskeletal:     Comments: She has large area of bruising involving the left arm about two thirds the way down laterally.  No hematoma.  She has tenderness in palpating around the proximal humerus region but no clavicle tenderness.  Full range of motion left shoulder.  No acromion tenderness.  She has large bruise also left lateral hip region.  No hematoma.  Excellent range of motion left hip.  Tender greater trochanteric bursa  Neurological:     Mental Status: She is alert.        Assessment:     #1 left  shoulder and left arm pain following fall.  Hopefully just represents contusion and no fracture.  Obtain x-rays left shoulder to rule out proximal humerus fracture  #2 left hip pain.  This appears to be more lateral pain and suspect related to contusion.  No hematoma.  Low clinical suspicion for fracture but rule out greater trochanteric fracture  As above, she does have underlying chronic bursitis left hip and suspect exacerbated with fall    Plan:     -Obtain x-rays of left shoulder and left hip to further assess -These will be over read but did not show any acute bony abnormality by our reading. -Patient requesting injection left lateral hip over bursa region which she has benefited in the past from for her bursitis.  We discussed risk including risk of further bruising, bleeding, low risk of infection and patient consented.  We prepped the skin with Betadine.  Using 25-gauge 1-1/2 inch needle injected 40 mg Depo-Medrol and 2 cc of plain Xylocaine into area of tenderness greater trochanteric bursa and patient tolerated well.  She will try some icing 15 to 20 minutes 2-3 times daily for the next couple days  [name] MD [facility] at [facility]

## 2019-11-09 DIAGNOSIS — M7062 Trochanteric bursitis, left hip: Secondary | ICD-10-CM | POA: Diagnosis not present

## 2019-11-09 DIAGNOSIS — M25512 Pain in left shoulder: Secondary | ICD-10-CM | POA: Diagnosis not present

## 2019-11-09 DIAGNOSIS — M25552 Pain in left hip: Secondary | ICD-10-CM | POA: Diagnosis not present

## 2019-11-11 MED ORDER — METHYLPREDNISOLONE ACETATE 80 MG/ML IJ SUSP
80.0000 mg | Freq: Once | INTRAMUSCULAR | Status: AC
  Administered 2019-11-09: 09:00:00 80 mg via INTRAMUSCULAR

## 2019-11-11 NOTE — Addendum Note (Signed)
Addended by: Modena Morrow R on: 11/11/2019 06:37 AM   Modules accepted: Orders

## 2019-11-11 NOTE — Progress Notes (Signed)
This has been done.

## 2019-11-23 ENCOUNTER — Other Ambulatory Visit: Payer: Self-pay | Admitting: Family Medicine

## 2019-11-28 ENCOUNTER — Ambulatory Visit (INDEPENDENT_AMBULATORY_CARE_PROVIDER_SITE_OTHER)
Admission: RE | Admit: 2019-11-28 | Discharge: 2019-11-28 | Disposition: A | Discharge status: Home / Self Care | Payer: Medicare HMO | Source: Ambulatory Visit

## 2019-11-28 DIAGNOSIS — R059 Cough, unspecified: Secondary | ICD-10-CM

## 2019-11-28 DIAGNOSIS — J209 Acute bronchitis, unspecified: Secondary | ICD-10-CM

## 2019-11-28 DIAGNOSIS — R05 Cough: Secondary | ICD-10-CM | POA: Diagnosis not present

## 2019-11-28 DIAGNOSIS — J019 Acute sinusitis, unspecified: Secondary | ICD-10-CM

## 2019-11-28 MED ORDER — AZITHROMYCIN 250 MG PO TABS: ORAL_TABLET | ORAL | 0 refills | Status: DC

## 2019-11-28 MED ORDER — ALBUTEROL SULFATE HFA 108 (90 BASE) MCG/ACT IN AERS: 2.0000 | INHALATION_SPRAY | RESPIRATORY_TRACT | 0 refills | Status: DC | PRN

## 2019-11-28 NOTE — Discharge Instructions (Addendum)
I am treating you today for bronchitis.  I have sent in a z pack for you to take. Take 2 tablets today, and one tablet for the next 4 days.  I have also sent in an albuterol inhaler for you to use, 2 puffs every 4 hours as needed for wheezing, cough.

## 2019-11-28 NOTE — ED Provider Notes (Signed)
Virtual Visit via Video Note:  Rebecca Reid  initiated request for Telemedicine visit with [facility] team. I connected with Rebecca Reid  on 11/28/2019 at 9:18 AM  for a synchronized telemedicine visit using a video enabled HIPPA compliant telemedicine application. I verified that I am speaking with Rebecca Reid  using two identifiers. Bettey Mare, NP  was physically located in a [facility] site and Rebecca Reid was located at a different location.   The limitations of evaluation and management by telemedicine as well as the availability of in-person appointments were discussed. Patient was informed that she  may incur a bill ( including co-pay) for this virtual visit encounter. Rebecca Reid  expressed understanding and gave verbal consent to proceed with virtual visit.     History of Present Illness:Rebecca Reid  is a 74 y.o. female presents for evaluation of cough, wheezing for the last week. She reports that she does have a history of bronchitis. She also reports that she cannot tolerate taking steroids like prednisone. Denies N/V/D, fever, rash, other symptoms. Denies sick contacts. Denies known exposure to Covid 19. She reports that she has been coughing and wheezing for the last week. She reports that the cough is worse at night and is dry. She also reports that she gets into a coughing "fit" and cannot seem to stop coughing. She is unable to cough anything up. She reports HA and ST as well as post nasal drip. Has taken OTC cough medication with no relief.    Allergies  Allergen Reactions  . Doxycycline Nausea And Vomiting  . Erythromycin Base Other (See Comments)    yeast infection, GI upset  . Naproxen Other (See Comments)    GI upset  . Prednisone Hives    REACTION: hives, anxiety, insomnia  . Scallops [Shellfish Allergy] Nausea And Vomiting     Past Medical History:  Diagnosis Date  . ALLERGIC RHINITIS CAUSE UNSPECIFIED 04/30/2009   . Anxiety   . DEPRESSION, HX OF 04/11/2010  . Dysuria 05/06/2010  . Gallstones   . GERD 04/30/2009  . HYPERLIPIDEMIA 04/30/2009  . HYPERTENSION 04/30/2009  . LEG EDEMA 12/12/2009  . OSTEOPENIA 04/30/2009  . PVC (premature ventricular contraction)   . Status post dilation of esophageal narrowing      Social History   Tobacco Use  . Smoking status: Never Smoker  . Smokeless tobacco: Never Used  Substance Use Topics  . Alcohol use: Yes    Comment: social  . Drug use: Never        Observations/Objective: Physical Exam  Constitutional: She is well-developed, well-nourished, and in no distress.  HENT:  Head: Normocephalic and atraumatic.     Assessment and Plan:    ICD-10-CM   1. Cough  R05   2. Acute bronchitis, unspecified organism  J20.9   3. Acute non-recurrent sinusitis, unspecified location  J01.90        Follow Up Instructions:  Prescribed z-pack and albuterol inhaler for Acute Bronchitis and sinusitis treatment. Instructed patient that she may use ibuprofen and tylenol as needed. Instructed that she may also want to change up her allergy medication as she has been taking Zyrtec for months. She may also add Allegra to her regimen.     I discussed the assessment and treatment plan with the patient. The patient was provided an opportunity to ask questions and all were answered. The patient agreed with the plan and demonstrated an understanding of  the instructions.   The patient was advised to call back or seek an in-person evaluation if the symptoms worsen or if the condition fails to improve as anticipated.  I spent 12 minutes non face-to-face time with this encounter.      Bettey Mare, NP  11/28/2019 9:18 AM        Faustino Congress, NP 11/28/19 [phone]

## 2019-12-01 ENCOUNTER — Telehealth: Payer: Self-pay | Admitting: *Deleted

## 2019-12-01 ENCOUNTER — Telehealth (INDEPENDENT_AMBULATORY_CARE_PROVIDER_SITE_OTHER): Payer: Medicare HMO | Admitting: Family Medicine

## 2019-12-01 ENCOUNTER — Encounter: Payer: Self-pay | Admitting: Family Medicine

## 2019-12-01 VITALS — Wt 177.0 lb

## 2019-12-01 DIAGNOSIS — R059 Cough, unspecified: Secondary | ICD-10-CM

## 2019-12-01 DIAGNOSIS — J309 Allergic rhinitis, unspecified: Secondary | ICD-10-CM | POA: Diagnosis not present

## 2019-12-01 DIAGNOSIS — J988 Other specified respiratory disorders: Secondary | ICD-10-CM | POA: Diagnosis not present

## 2019-12-01 DIAGNOSIS — Z889 Allergy status to unspecified drugs, medicaments and biological substances status: Secondary | ICD-10-CM | POA: Diagnosis not present

## 2019-12-01 DIAGNOSIS — R05 Cough: Secondary | ICD-10-CM | POA: Diagnosis not present

## 2019-12-01 MED ORDER — BENZONATATE 100 MG PO CAPS: 100.0000 mg | ORAL_CAPSULE | Freq: Three times a day (TID) | ORAL | 0 refills | Status: DC | PRN

## 2019-12-01 MED ORDER — AMOXICILLIN-POT CLAVULANATE 875-125 MG PO TABS: 1.0000 | ORAL_TABLET | Freq: Two times a day (BID) | ORAL | 0 refills | Status: DC

## 2019-12-01 NOTE — Telephone Encounter (Signed)
Spoke with the pt and offered a virtual visit today with a different provider or tomorrow with her PCP.  Per pts preference, appt scheduled for today with Dr Maudie Mercury.

## 2019-12-01 NOTE — Telephone Encounter (Signed)
Patient called after hours line. Patient had a virtual visit on 11/28/2019 with [facility]. Pt. Reports she was given an inhaler and z-pac 250mg , caller states she is not feeling any better and her cough is still there and throat is sore -- Caller states she was told if she was not feeling better to call the office back as they normally give her the 500mg  z-pac--- Caller is coughing really bad and wants the office to call her back

## 2019-12-01 NOTE — Progress Notes (Signed)
Virtual Visit via Video Note  I connected with Rebecca Reid  on 12/01/19 at 11:20 AM EDT by a video enabled telemedicine application and verified that I am speaking with the correct person using two identifiers.  Location patient: home, [place] Location provider:work or home office Persons participating in the virtual visit: patient, provider  I discussed the limitations of evaluation and management by telemedicine and the availability of in person appointments. The patient expressed understanding and agreed to proceed.   HPI:  Acute visit for "bronchitis": -this started about 2 weeks ago -symptoms include cough, PND, green and yellow thick sinus congestion,  mild SOB - she feels from coughing so much, HA -denies fever, body aches, NVD, loss of taste or smell, hemoptysis, sick exposures -she spoke with a NP about 3 days ago and was given 250mg  of azithro and albuterol. Not better and still has green sinus drainage and terrible cough -no known sick contacts in the last two week -denies history of lung disease or smoking -has history of allergic rhinitis, takes zyrtec -She has been fully vaccinated for COVID19 for > 1 month -has had NV with doxycycline and had "terrible reaction" to prednisone  ROS: See pertinent positives and negatives per HPI.  Past Medical History:  Diagnosis Date  . ALLERGIC RHINITIS CAUSE UNSPECIFIED 04/30/2009  . Anxiety   . DEPRESSION, HX OF 04/11/2010  . Dysuria 05/06/2010  . Gallstones   . GERD 04/30/2009  . HYPERLIPIDEMIA 04/30/2009  . HYPERTENSION 04/30/2009  . LEG EDEMA 12/12/2009  . OSTEOPENIA 04/30/2009  . PVC (premature ventricular contraction)   . Status post dilation of esophageal narrowing     Past Surgical History:  Procedure Laterality Date  . ABDOMINAL HYSTERECTOMY  1986  . AUGMENTATION MAMMAPLASTY    . BREAST ENHANCEMENT SURGERY  1987  . BREAST SURGERY  1974   biopsy  . CARDIAC CATHETERIZATION N/A 11/02/2015   Procedure: Left Heart Cath and  Coronary Angiography;  Surgeon: Belva Crome, MD;  Location: [facility];  Service: Cardiovascular;  Laterality: N/A;  . CATARACT EXTRACTION, BILATERAL Bilateral    one in feb and one in March Dr. Tommy Rainwater  . CHOLECYSTECTOMY  1974  . ectopic pregnancy    . OTHER SURGICAL HISTORY     Stomach was not developed at birth    Family History  Problem Relation Age of Onset  . Emphysema Mother        heavy smoker  . Heart disease Mother 69  . Hyperlipidemia Mother   . Hypertension Mother   . Skin cancer Mother        melanomia, spread to her spine  . Arthritis Mother   . Heart disease Father 44  . Prostate cancer Father   . Hyperlipidemia Father   . Hypertension Father   . Colon cancer Neg Hx   . Esophageal cancer Neg Hx   . Rectal cancer Neg Hx   . Breast cancer Neg Hx     SOCIAL HX: see hpi   Current Outpatient Medications:  .  albuterol (VENTOLIN HFA) 108 (90 Base) MCG/ACT inhaler, Inhale 2 puffs into the lungs every 4 (four) hours as needed for wheezing or shortness of breath., Disp: 18 g, Rfl: 0 .  aspirin 81 MG tablet, Take 81 mg by mouth daily.  , Disp: , Rfl:  .  azelastine (ASTELIN) 0.1 % nasal spray, Place 2 sprays into both nostrils 2 (two) times daily. Use in each nostril as directed, Disp: 30 mL, Rfl: 12 .  azithromycin (ZITHROMAX Z-PAK) 250 MG tablet, Take 2 tablets today, and 1 tablet for the next 4 days., Disp: 6 tablet, Rfl: 0 .  cetirizine (ZYRTEC) 5 MG tablet, Take 5 mg by mouth as needed for allergies., Disp: , Rfl:  .  cholecalciferol (VITAMIN D) 1000 units tablet, Take 5,000 Units by mouth daily. , Disp: , Rfl:  .  diclofenac sodium (VOLTAREN) 1 % GEL, Apply 2 g topically 4 (four) times daily., Disp: 100 g, Rfl: 1 .  Ibuprofen-diphenhydrAMINE Cit (ADVIL PM PO), Take 1 tablet by mouth at bedtime., Disp: , Rfl:  .  losartan-hydrochlorothiazide (HYZAAR) 100-25 MG tablet, TAKE 1/2 TABLET BY MOUTH DAILY, Disp: 45 tablet, Rfl: 2 .  metoprolol succinate  (TOPROL-XL) 50 MG 24 hr tablet, TAKE ONE TABLET BY MOUTH DAILY WITH OR IMMEDIATELY AFTER A MEAL, Disp: 90 tablet, Rfl: 2 .  metroNIDAZOLE (METROGEL) 0.75 % gel, Apply 1 application topically 2 (two) times daily., Disp: 45 g, Rfl: 2 .  omeprazole (PRILOSEC) 40 MG capsule, Take 80 mg by mouth 2 (two) times daily. , Disp: , Rfl:  .  sertraline (ZOLOFT) 100 MG tablet, TAKE ONE TABLET BY MOUTH DAILY, Disp: 30 tablet, Rfl: 2 .  amoxicillin-clavulanate (AUGMENTIN) 875-125 MG tablet, Take 1 tablet by mouth 2 (two) times daily., Disp: 14 tablet, Rfl: 0 .  benzonatate (TESSALON PERLES) 100 MG capsule, Take 1 capsule (100 mg total) by mouth 3 (three) times daily as needed., Disp: 20 capsule, Rfl: 0  EXAM:  VITALS per patient if applicable:  GENERAL: alert, oriented, appears well and in no acute distress  HEENT: atraumatic, conjunttiva clear, no obvious abnormalities on inspection of external nose and ears, voice sounds somewhat hoarse during visit  NECK: normal movements of the head and neck  LUNGS: on inspection no signs of respiratory distress, breathing rate appears normal, no obvious gross SOB, gasping or wheezing, coughing during visit  CV: no obvious cyanosis  MS: moves all visible extremities without noticeable abnormality  PSYCH/NEURO: pleasant and cooperative, no obvious depression or anxiety, speech and thought processing grossly intact  ASSESSMENT AND PLAN:  Discussed the following assessment and plan:  Respiratory infection  History of adverse drug reaction  Allergic rhinitis, unspecified seasonality, unspecified trigger  Cough  -we discussed possible serious and likely etiologies, options for evaluation and workup, limitations of telemedicine visit vs in person visit, treatment, treatment risks and precautions. Pt prefers to treat via telemedicine empirically rather then risking or undertaking an in person visit at this moment. Query sinusitis with cough from PND vs bronchitis  or LRI. Discussed doubt covid19 given no known exposures and fully vaccinated, of course not 100% ruled out. Opted to treat with first line abx for sinusitis with LR coverage as well and chose augmentin given her allergies. Trial tessalon for cough. She has taken depo-medrol in the past ok, so may require this if not improving. Continue daily antihistamine for Allergic rhinitis. Patient agrees to seek prompt in person care if worsening, new symptoms arise, or if is not improving with treatment.   I discussed the assessment and treatment plan with the patient. The patient was provided an opportunity to ask questions and all were answered. The patient agreed with the plan and demonstrated an understanding of the instructions.   The patient was advised to call back or seek an in-person evaluation if the symptoms worsen or if the condition fails to improve as anticipated.   Lucretia Kern, DO

## 2019-12-07 ENCOUNTER — Ambulatory Visit (INDEPENDENT_AMBULATORY_CARE_PROVIDER_SITE_OTHER): Payer: Medicare HMO | Admitting: Family Medicine

## 2019-12-07 ENCOUNTER — Encounter: Payer: Self-pay | Admitting: Family Medicine

## 2019-12-07 ENCOUNTER — Ambulatory Visit (INDEPENDENT_AMBULATORY_CARE_PROVIDER_SITE_OTHER): Payer: Medicare HMO

## 2019-12-07 ENCOUNTER — Other Ambulatory Visit: Payer: Self-pay

## 2019-12-07 VITALS — BP 126/72 | HR 63 | Temp 97.5°F | Wt 181.9 lb

## 2019-12-07 DIAGNOSIS — M79641 Pain in right hand: Secondary | ICD-10-CM

## 2019-12-07 NOTE — Progress Notes (Signed)
Subjective:     Patient ID: Rebecca Reid, female   DOB: 1946-07-17, 74 y.o.   MRN: [id number]  HPI   Rebecca Reid is here with acute injury.  She fell yesterday onto her right hand.  She was rearranging some furniture and got her foot caught on a rug and went down onto her hand.  She has history of chronic flexion contracture of the right fifth digit PIP joint.  She has had some pain involving that digit as well as the fourth digit with some bruising dorsally over the fourth digit and also some bruising over the thenar eminence of the thumb.  She has actually less pain around the thumb region.  No wrist pain.  Minimal pain distal fifth metacarpal  She did bump her chin and has a small bruise there.  There was no loss of consciousness.  Denies any other injuries.  She applied some ice and ibuprofen yesterday  Past Medical History:  Diagnosis Date  . ALLERGIC RHINITIS CAUSE UNSPECIFIED 04/30/2009  . Anxiety   . DEPRESSION, HX OF 04/11/2010  . Dysuria 05/06/2010  . Gallstones   . GERD 04/30/2009  . HYPERLIPIDEMIA 04/30/2009  . HYPERTENSION 04/30/2009  . LEG EDEMA 12/12/2009  . OSTEOPENIA 04/30/2009  . PVC (premature ventricular contraction)   . Status post dilation of esophageal narrowing    Past Surgical History:  Procedure Laterality Date  . ABDOMINAL HYSTERECTOMY  1986  . AUGMENTATION MAMMAPLASTY    . BREAST ENHANCEMENT SURGERY  1987  . BREAST SURGERY  1974   biopsy  . CARDIAC CATHETERIZATION N/A 11/02/2015   Procedure: Left Heart Cath and Coronary Angiography;  Surgeon: Belva Crome, MD;  Location: [facility];  Service: Cardiovascular;  Laterality: N/A;  . CATARACT EXTRACTION, BILATERAL Bilateral    one in feb and one in March Dr. Tommy Rainwater  . CHOLECYSTECTOMY  1974  . ectopic pregnancy    . OTHER SURGICAL HISTORY     Stomach was not developed at birth    reports that she has never smoked. She has never used smokeless tobacco. She reports current alcohol use. She reports that  she does not use drugs. family history includes Arthritis in her mother; Emphysema in her mother; Heart disease (age of onset: 19) in her father; Heart disease (age of onset: 53) in her mother; Hyperlipidemia in her father and mother; Hypertension in her father and mother; Prostate cancer in her father; Skin cancer in her mother. Allergies  Allergen Reactions  . Doxycycline Nausea And Vomiting  . Erythromycin Base Other (See Comments)    yeast infection, GI upset  . Naproxen Other (See Comments)    GI upset  . Prednisone Hives    REACTION: hives, anxiety, insomnia  . Scallops [Shellfish Allergy] Nausea And Vomiting     Review of Systems  Neurological: Negative for headaches.       Objective:   Physical Exam Vitals reviewed.  Constitutional:      Appearance: Normal appearance.  Cardiovascular:     Comments: Frequent skipped beats which is chronic for her Pulmonary:     Effort: Pulmonary effort is normal.     Breath sounds: Normal breath sounds.  Musculoskeletal:     Comments: Right hand reveals chronic flexion contracture fifth digit PIP joint.  She has some bruising diffusely over her thenar eminence of the right thumb.  She has otherwise full range of motion all digits of the hand.  She has some bruising dorsal aspect of the right  fourth digit from the MCP to PIP joint.  She has some mild swelling of the PIP joint.  Mild proximal tenderness fourth digit No navicular tenderness.   Neurological:     Mental Status: She is alert.        Assessment:     Right hand pain following fall.  She has chronic flexion contracture fifth digit PIP joint    Plan:     -Obtain x-rays to rule out fracture.  No obvious fracture seen and this will be reviewed by radiology.  [name] MD [facility]

## 2019-12-15 ENCOUNTER — Encounter: Payer: Self-pay | Admitting: Family Medicine

## 2019-12-15 DIAGNOSIS — M79641 Pain in right hand: Secondary | ICD-10-CM

## 2019-12-23 ENCOUNTER — Other Ambulatory Visit: Payer: Self-pay | Admitting: Family Medicine

## 2020-01-05 DIAGNOSIS — M720 Palmar fascial fibromatosis [Dupuytren]: Secondary | ICD-10-CM | POA: Diagnosis not present

## 2020-01-11 DIAGNOSIS — R69 Illness, unspecified: Secondary | ICD-10-CM | POA: Diagnosis not present

## 2020-01-25 ENCOUNTER — Other Ambulatory Visit: Payer: Self-pay

## 2020-01-25 DIAGNOSIS — M720 Palmar fascial fibromatosis [Dupuytren]: Secondary | ICD-10-CM | POA: Diagnosis not present

## 2020-01-25 DIAGNOSIS — M25511 Pain in right shoulder: Secondary | ICD-10-CM | POA: Diagnosis not present

## 2020-01-25 DIAGNOSIS — G8918 Other acute postprocedural pain: Secondary | ICD-10-CM | POA: Diagnosis not present

## 2020-01-26 DIAGNOSIS — M79641 Pain in right hand: Secondary | ICD-10-CM | POA: Insufficient documentation

## 2020-01-29 ENCOUNTER — Encounter: Payer: Self-pay | Admitting: Family Medicine

## 2020-01-31 DIAGNOSIS — M79641 Pain in right hand: Secondary | ICD-10-CM | POA: Diagnosis not present

## 2020-02-03 ENCOUNTER — Encounter: Payer: Self-pay | Admitting: Family Medicine

## 2020-02-09 DIAGNOSIS — M79641 Pain in right hand: Secondary | ICD-10-CM | POA: Diagnosis not present

## 2020-02-16 DIAGNOSIS — M79641 Pain in right hand: Secondary | ICD-10-CM | POA: Diagnosis not present

## 2020-02-21 DIAGNOSIS — M79641 Pain in right hand: Secondary | ICD-10-CM | POA: Diagnosis not present

## 2020-02-23 DIAGNOSIS — M79641 Pain in right hand: Secondary | ICD-10-CM | POA: Diagnosis not present

## 2020-02-27 ENCOUNTER — Other Ambulatory Visit: Payer: Self-pay

## 2020-02-27 DIAGNOSIS — M79641 Pain in right hand: Secondary | ICD-10-CM | POA: Diagnosis not present

## 2020-03-01 DIAGNOSIS — M79641 Pain in right hand: Secondary | ICD-10-CM | POA: Diagnosis not present

## 2020-03-05 DIAGNOSIS — M79641 Pain in right hand: Secondary | ICD-10-CM | POA: Diagnosis not present

## 2020-03-08 DIAGNOSIS — M79641 Pain in right hand: Secondary | ICD-10-CM | POA: Diagnosis not present

## 2020-03-13 DIAGNOSIS — M79641 Pain in right hand: Secondary | ICD-10-CM | POA: Diagnosis not present

## 2020-03-16 DIAGNOSIS — M79641 Pain in right hand: Secondary | ICD-10-CM | POA: Diagnosis not present

## 2020-03-18 ENCOUNTER — Encounter: Payer: Self-pay | Admitting: Family Medicine

## 2020-03-20 DIAGNOSIS — M79641 Pain in right hand: Secondary | ICD-10-CM | POA: Diagnosis not present

## 2020-03-20 NOTE — Telephone Encounter (Signed)
Please advise 

## 2020-03-23 DIAGNOSIS — M79641 Pain in right hand: Secondary | ICD-10-CM | POA: Diagnosis not present

## 2020-03-27 DIAGNOSIS — M79641 Pain in right hand: Secondary | ICD-10-CM | POA: Diagnosis not present

## 2020-03-29 DIAGNOSIS — M79641 Pain in right hand: Secondary | ICD-10-CM | POA: Diagnosis not present

## 2020-04-02 ENCOUNTER — Ambulatory Visit (INDEPENDENT_AMBULATORY_CARE_PROVIDER_SITE_OTHER): Payer: Medicare HMO

## 2020-04-02 ENCOUNTER — Other Ambulatory Visit: Payer: Self-pay

## 2020-04-02 DIAGNOSIS — M79641 Pain in right hand: Secondary | ICD-10-CM | POA: Diagnosis not present

## 2020-04-02 DIAGNOSIS — Z23 Encounter for immunization: Secondary | ICD-10-CM | POA: Diagnosis not present

## 2020-04-05 DIAGNOSIS — M79641 Pain in right hand: Secondary | ICD-10-CM | POA: Diagnosis not present

## 2020-04-09 DIAGNOSIS — M79641 Pain in right hand: Secondary | ICD-10-CM | POA: Diagnosis not present

## 2020-04-10 ENCOUNTER — Telehealth (INDEPENDENT_AMBULATORY_CARE_PROVIDER_SITE_OTHER): Payer: Medicare HMO | Admitting: Family Medicine

## 2020-04-10 ENCOUNTER — Telehealth: Payer: Self-pay | Admitting: Family Medicine

## 2020-04-10 DIAGNOSIS — R413 Other amnesia: Secondary | ICD-10-CM | POA: Diagnosis not present

## 2020-04-10 DIAGNOSIS — I10 Essential (primary) hypertension: Secondary | ICD-10-CM | POA: Diagnosis not present

## 2020-04-10 DIAGNOSIS — E785 Hyperlipidemia, unspecified: Secondary | ICD-10-CM

## 2020-04-10 MED ORDER — SERTRALINE HCL 50 MG PO TABS: 50.0000 mg | ORAL_TABLET | Freq: Every day | ORAL | 3 refills | Status: DC

## 2020-04-10 NOTE — Progress Notes (Addendum)
Patient ID: Rebecca Reid, female   DOB: Mar 23, 1946, 74 y.o.   MRN: [id number]   This visit type was conducted due to national recommendations for restrictions regarding the COVID-19 pandemic in an effort to limit this patient's exposure and mitigate transmission in our community.   Virtual Visit via Video Note  I connected with Rebecca Reid on 04/10/20 at  2:30 PM EDT by a video enabled telemedicine application and verified that I am speaking with the correct person using two identifiers.  Location patient: home Location provider:work or home office Persons participating in the virtual visit: patient, provider  I discussed the limitations of evaluation and management by telemedicine and the availability of in person appointments. The patient expressed understanding and agreed to proceed.   HPI:  Rebecca Reid called several concerns.  She states she has recently felt "foggy ".  She has had concerns regarding memory.  For example, she was recently cooking and had a mental block about a recipe that she had done before.  She was driving once and felt like she got distracted and had to get very focused to remember where she was going.  She has had these kind of things with more frequency recently.  She has longstanding history of GERD and takes proton pump inhibitor omeprazole 40 mg once a sometimes twice daily.  No recent B12 levels.  She had thyroid functions last winter which were stable.  She has hyperlipidemia would like to get follow-up lipid panel.  She specifically had questions about sertraline.  She was placed on this years ago for anxiety issues and currently take 100 mg daily.  She wonders if it would be reasonable to try titrating down to 50 mg.  The 10-year ASCVD risk score Mikey Bussing DC Brooke Bonito., et al., 2013) is: 17%   Values used to calculate the score:     Age: 69 years     Sex: Female     Is Non-Hispanic African American: No     Diabetic: No     Tobacco smoker: No     Systolic Blood  Pressure: 126 mmHg     Is BP treated: Yes     HDL Cholesterol: 68 mg/dL     Total Cholesterol: 243 mg/dL    ROS: See pertinent positives and negatives per HPI.  Past Medical History:  Diagnosis Date  . ALLERGIC RHINITIS CAUSE UNSPECIFIED 04/30/2009  . Anxiety   . DEPRESSION, HX OF 04/11/2010  . Dysuria 05/06/2010  . Gallstones   . GERD 04/30/2009  . HYPERLIPIDEMIA 04/30/2009  . HYPERTENSION 04/30/2009  . LEG EDEMA 12/12/2009  . OSTEOPENIA 04/30/2009  . PVC (premature ventricular contraction)   . Status post dilation of esophageal narrowing     Past Surgical History:  Procedure Laterality Date  . ABDOMINAL HYSTERECTOMY  1986  . AUGMENTATION MAMMAPLASTY    . BREAST ENHANCEMENT SURGERY  1987  . BREAST SURGERY  1974   biopsy  . CARDIAC CATHETERIZATION N/A 11/02/2015   Procedure: Left Heart Cath and Coronary Angiography;  Surgeon: Belva Crome, MD;  Location: [facility];  Service: Cardiovascular;  Laterality: N/A;  . CATARACT EXTRACTION, BILATERAL Bilateral    one in feb and one in March Dr. Tommy Rainwater  . CHOLECYSTECTOMY  1974  . ectopic pregnancy    . OTHER SURGICAL HISTORY     Stomach was not developed at birth    Family History  Problem Relation Age of Onset  . Emphysema Mother  heavy smoker  . Heart disease Mother 51  . Hyperlipidemia Mother   . Hypertension Mother   . Skin cancer Mother        melanomia, spread to her spine  . Arthritis Mother   . Heart disease Father 55  . Prostate cancer Father   . Hyperlipidemia Father   . Hypertension Father   . Colon cancer Neg Hx   . Esophageal cancer Neg Hx   . Rectal cancer Neg Hx   . Breast cancer Neg Hx     SOCIAL HX: Non-smoker   Current Outpatient Medications:  .  albuterol (VENTOLIN HFA) 108 (90 Base) MCG/ACT inhaler, Inhale 2 puffs into the lungs every 4 (four) hours as needed for wheezing or shortness of breath., Disp: 18 g, Rfl: 0 .  Ascorbic Acid (VITAMIN C) 500 MG CAPS, Take by mouth 2 (two)  times daily., Disp: , Rfl:  .  aspirin 81 MG tablet, Take 81 mg by mouth daily.  , Disp: , Rfl:  .  azelastine (ASTELIN) 0.1 % nasal spray, Place 2 sprays into both nostrils 2 (two) times daily. Use in each nostril as directed, Disp: 30 mL, Rfl: 12 .  cetirizine (ZYRTEC) 5 MG tablet, Take 5 mg by mouth as needed for allergies., Disp: , Rfl:  .  cholecalciferol (VITAMIN D) 1000 units tablet, Take 5,000 Units by mouth daily. , Disp: , Rfl:  .  diclofenac sodium (VOLTAREN) 1 % GEL, Apply 2 g topically 4 (four) times daily., Disp: 100 g, Rfl: 1 .  Ibuprofen-diphenhydrAMINE Cit (ADVIL PM PO), Take 1 tablet by mouth at bedtime., Disp: , Rfl:  .  losartan-hydrochlorothiazide (HYZAAR) 100-25 MG tablet, TAKE 1/2 TABLET BY MOUTH DAILY, Disp: 45 tablet, Rfl: 2 .  metoprolol succinate (TOPROL-XL) 50 MG 24 hr tablet, TAKE ONE TABLET BY MOUTH DAILY WITH OR IMMEDIATELY AFTER A MEAL, Disp: 90 tablet, Rfl: 2 .  metroNIDAZOLE (METROGEL) 0.75 % gel, Apply 1 application topically 2 (two) times daily., Disp: 45 g, Rfl: 2 .  omeprazole (PRILOSEC) 40 MG capsule, Take 80 mg by mouth 2 (two) times daily. , Disp: , Rfl:  .  Pyridoxine HCl (VITAMIN B-6 PO), Take 300 mg by mouth 2 (two) times daily., Disp: , Rfl:  .  sertraline (ZOLOFT) 50 MG tablet, Take 1 tablet (50 mg total) by mouth daily., Disp: 90 tablet, Rfl: 3  EXAM:  VITALS per patient if applicable:  GENERAL: alert, oriented, appears well and in no acute distress  HEENT: atraumatic, conjunttiva clear, no obvious abnormalities on inspection of external nose and ears  NECK: normal movements of the head and neck  LUNGS: on inspection no signs of respiratory distress, breathing rate appears normal, no obvious gross SOB, gasping or wheezing  CV: no obvious cyanosis  MS: moves all visible extremities without noticeable abnormality  PSYCH/NEURO: pleasant and cooperative, no obvious depression or anxiety, speech and thought processing grossly  intact  ASSESSMENT AND PLAN:  Discussed the following assessment and plan:  Dyslipidemia - Plan: Hepatic function panel, Lipid panel  Essential hypertension - Plan: Basic metabolic panel  Memory loss or impairment - Plan: TSH, Vitamin B12  -Reduce sertraline to 50 mg with new prescription sent -Future lab order for B12, TSH, lipid, hepatic, basic metabolic panel -If all above normal and symptoms not improved with the above consider further neurocognitive assessment with neuropsychologist   I discussed the assessment and treatment plan with the patient. The patient was provided an opportunity to ask questions and  all were answered. The patient agreed with the plan and demonstrated an understanding of the instructions.   The patient was advised to call back or seek an in-person evaluation if the symptoms worsen or if the condition fails to improve as anticipated.     Carolann Littler, MD

## 2020-04-10 NOTE — Progress Notes (Signed)
  Chronic Care Management   Outreach Note  04/10/2020 Name: Rebecca Reid MRN: [id number] DOB: 17-Aug-1945  Referred by: [name], MD Reason for referral : No chief complaint on file.   An unsuccessful telephone outreach was attempted today. The patient was referred to the pharmacist for assistance with care management and care coordination.   Follow Up Plan:   Carley Perdue [employer] Scheduler

## 2020-04-11 ENCOUNTER — Telehealth: Payer: Self-pay | Admitting: Family Medicine

## 2020-04-11 NOTE — Progress Notes (Signed)
  Chronic Care Management   Note  04/11/2020 Name: GEANIE PACIFICO MRN: [id number] DOB: [birth year]  CLARABELLE OSCARSON is a 74 y.o. year old female who is a primary care patient of Burchette, Alinda Sierras, MD. I reached out to Lucia Estelle by phone today in response to a referral sent by Ms. Pamalee Leyden Wiltrout's PCP, [name], MD.   Ms. Matuska was given information about Chronic Care Management services today including:  1. CCM service includes personalized support from designated clinical staff supervised by her physician, including individualized plan of care and coordination with other care providers 2. 24/7 contact phone numbers for assistance for urgent and routine care needs. 3. Service will only be billed when office clinical staff spend 20 minutes or more in a month to coordinate care. 4. Only one practitioner may furnish and bill the service in a calendar month. 5. The patient may stop CCM services at any time (effective at the end of the month) by phone call to the office staff.   Patient agreed to services and verbal consent obtained.   Follow up plan:   Carley Perdue [employer] Scheduler

## 2020-04-12 DIAGNOSIS — M79641 Pain in right hand: Secondary | ICD-10-CM | POA: Diagnosis not present

## 2020-04-13 ENCOUNTER — Other Ambulatory Visit: Payer: Self-pay

## 2020-04-13 ENCOUNTER — Other Ambulatory Visit: Payer: Medicare HMO

## 2020-04-13 DIAGNOSIS — R413 Other amnesia: Secondary | ICD-10-CM | POA: Diagnosis not present

## 2020-04-13 DIAGNOSIS — I10 Essential (primary) hypertension: Secondary | ICD-10-CM | POA: Diagnosis not present

## 2020-04-13 DIAGNOSIS — E785 Hyperlipidemia, unspecified: Secondary | ICD-10-CM | POA: Diagnosis not present

## 2020-04-14 LAB — HEPATIC FUNCTION PANEL
AG Ratio: 1.7 (calc) (ref 1.0–2.5)
ALT: 12 U/L (ref 6–29)
AST: 17 U/L (ref 10–35)
Albumin: 4.3 g/dL (ref 3.6–5.1)
Alkaline phosphatase (APISO): 70 U/L (ref 37–153)
Bilirubin, Direct: 0.1 mg/dL (ref 0.0–0.2)
Globulin: 2.5 g/dL (calc) (ref 1.9–3.7)
Indirect Bilirubin: 0.5 mg/dL (calc) (ref 0.2–1.2)
Total Bilirubin: 0.6 mg/dL (ref 0.2–1.2)
Total Protein: 6.8 g/dL (ref 6.1–8.1)

## 2020-04-14 LAB — BASIC METABOLIC PANEL
BUN: 18 mg/dL (ref 7–25)
CO2: 27 mmol/L (ref 20–32)
Calcium: 9.6 mg/dL (ref 8.6–10.4)
Chloride: 102 mmol/L (ref 98–110)
Creat: 0.71 mg/dL (ref 0.60–0.93)
Glucose, Bld: 92 mg/dL (ref 65–99)
Potassium: 4 mmol/L (ref 3.5–5.3)
Sodium: 139 mmol/L (ref 135–146)

## 2020-04-14 LAB — LIPID PANEL
Cholesterol: 243 mg/dL — ABNORMAL HIGH (ref <200)
HDL: 68 mg/dL (ref >=50)
LDL Cholesterol (Calc): 153 mg/dL (calc) — ABNORMAL HIGH
Non-HDL Cholesterol (Calc): 175 mg/dL (calc) — ABNORMAL HIGH (ref <130)
Total CHOL/HDL Ratio: 3.6 (calc) (ref <5.0)
Triglycerides: 103 mg/dL (ref <150)

## 2020-04-14 LAB — EXTRA LAV TOP TUBE

## 2020-04-14 LAB — VITAMIN B12: Vitamin B-12: 399 pg/mL (ref 200–1100)

## 2020-04-14 LAB — TSH: TSH: 4.09 mIU/L (ref 0.40–4.50)

## 2020-04-17 ENCOUNTER — Encounter: Payer: Self-pay | Admitting: Family Medicine

## 2020-04-17 DIAGNOSIS — M79641 Pain in right hand: Secondary | ICD-10-CM | POA: Diagnosis not present

## 2020-04-19 DIAGNOSIS — M79641 Pain in right hand: Secondary | ICD-10-CM | POA: Diagnosis not present

## 2020-04-22 ENCOUNTER — Encounter: Payer: Self-pay | Admitting: Family Medicine

## 2020-04-23 DIAGNOSIS — M79641 Pain in right hand: Secondary | ICD-10-CM | POA: Diagnosis not present

## 2020-04-25 ENCOUNTER — Encounter: Payer: Self-pay | Admitting: Family Medicine

## 2020-04-27 DIAGNOSIS — M79641 Pain in right hand: Secondary | ICD-10-CM | POA: Diagnosis not present

## 2020-05-01 DIAGNOSIS — M79641 Pain in right hand: Secondary | ICD-10-CM | POA: Diagnosis not present

## 2020-05-04 DIAGNOSIS — M79641 Pain in right hand: Secondary | ICD-10-CM | POA: Diagnosis not present

## 2020-05-08 DIAGNOSIS — M79641 Pain in right hand: Secondary | ICD-10-CM | POA: Diagnosis not present

## 2020-05-11 DIAGNOSIS — M79641 Pain in right hand: Secondary | ICD-10-CM | POA: Diagnosis not present

## 2020-05-14 ENCOUNTER — Other Ambulatory Visit: Payer: Self-pay | Admitting: Family Medicine

## 2020-05-14 DIAGNOSIS — E785 Hyperlipidemia, unspecified: Secondary | ICD-10-CM

## 2020-05-14 NOTE — Progress Notes (Signed)
Created future orders for lipid and HFP per last labs drawn per PCP/thx dmf

## 2020-05-15 ENCOUNTER — Encounter: Payer: Self-pay | Admitting: Family Medicine

## 2020-05-21 ENCOUNTER — Other Ambulatory Visit (INDEPENDENT_AMBULATORY_CARE_PROVIDER_SITE_OTHER): Payer: Medicare HMO

## 2020-05-21 ENCOUNTER — Other Ambulatory Visit: Payer: Self-pay

## 2020-05-21 DIAGNOSIS — E785 Hyperlipidemia, unspecified: Secondary | ICD-10-CM | POA: Diagnosis not present

## 2020-05-21 DIAGNOSIS — R69 Illness, unspecified: Secondary | ICD-10-CM | POA: Diagnosis not present

## 2020-05-22 LAB — HEPATIC FUNCTION PANEL
AG Ratio: 1.7 (calc) (ref 1.0–2.5)
ALT: 12 U/L (ref 6–29)
AST: 17 U/L (ref 10–35)
Albumin: 4.1 g/dL (ref 3.6–5.1)
Alkaline phosphatase (APISO): 71 U/L (ref 37–153)
Bilirubin, Direct: 0.2 mg/dL (ref 0.0–0.2)
Globulin: 2.4 g/dL (calc) (ref 1.9–3.7)
Indirect Bilirubin: 0.8 mg/dL (calc) (ref 0.2–1.2)
Total Bilirubin: 1 mg/dL (ref 0.2–1.2)
Total Protein: 6.5 g/dL (ref 6.1–8.1)

## 2020-05-22 LAB — LIPID PANEL
Cholesterol: 230 mg/dL — ABNORMAL HIGH (ref <200)
HDL: 67 mg/dL (ref >=50)
LDL Cholesterol (Calc): 145 mg/dL (calc) — ABNORMAL HIGH
Non-HDL Cholesterol (Calc): 163 mg/dL (calc) — ABNORMAL HIGH (ref <130)
Total CHOL/HDL Ratio: 3.4 (calc) (ref <5.0)
Triglycerides: 76 mg/dL (ref <150)

## 2020-05-24 DIAGNOSIS — M720 Palmar fascial fibromatosis [Dupuytren]: Secondary | ICD-10-CM | POA: Diagnosis not present

## 2020-06-06 ENCOUNTER — Telehealth: Payer: Medicare HMO

## 2020-06-26 ENCOUNTER — Other Ambulatory Visit: Payer: Self-pay

## 2020-06-26 ENCOUNTER — Ambulatory Visit (INDEPENDENT_AMBULATORY_CARE_PROVIDER_SITE_OTHER): Payer: Medicare HMO | Admitting: Family Medicine

## 2020-06-26 VITALS — BP 160/80 | Ht 65.75 in | Wt 181.0 lb

## 2020-06-26 DIAGNOSIS — E785 Hyperlipidemia, unspecified: Secondary | ICD-10-CM | POA: Diagnosis not present

## 2020-06-26 DIAGNOSIS — I10 Essential (primary) hypertension: Secondary | ICD-10-CM

## 2020-06-26 NOTE — Progress Notes (Signed)
Established Patient Office Visit  Subjective:  Patient ID: Rebecca Reid, female    DOB: 12-02-1945  Age: 74 y.o. MRN: [id number]  CC:  Chief Complaint  Patient presents with  . Follow-up    HPI Rebecca Reid presents for medical follow-up.  She has had history of hyperlipidemia.  She did have coronary cath 2017 with normal coronaries.  She has high 10-year risk of calculated coronary disease but again favorable catheterization a few years ago.  She has been reluctant to consider statin medications.  She states she had poor diet with some high saturated fats until recently and she hopes to repeat her lipids in a few months.  She has hypertension.  She is currently taking losartan HCTZ 1/2 tablet daily along with metoprolol.  She has had previous dizziness and lightheadedness with taking a full tablet of losartan.  She has a home blood pressure cuff but not monitoring recently.  No headaches.  No dizziness.  No chest pains.  Family history significant for father having coronary disease in his 55s.  Patient is non-smoker.  No history of diabetes.  Past Medical History:  Diagnosis Date  . ALLERGIC RHINITIS CAUSE UNSPECIFIED 04/30/2009  . Anxiety   . DEPRESSION, HX OF 04/11/2010  . Dysuria 05/06/2010  . Gallstones   . GERD 04/30/2009  . HYPERLIPIDEMIA 04/30/2009  . HYPERTENSION 04/30/2009  . LEG EDEMA 12/12/2009  . OSTEOPENIA 04/30/2009  . PVC (premature ventricular contraction)   . Status post dilation of esophageal narrowing     Past Surgical History:  Procedure Laterality Date  . ABDOMINAL HYSTERECTOMY  1986  . AUGMENTATION MAMMAPLASTY    . BREAST ENHANCEMENT SURGERY  1987  . BREAST SURGERY  1974   biopsy  . CARDIAC CATHETERIZATION N/A 11/02/2015   Procedure: Left Heart Cath and Coronary Angiography;  Surgeon: Belva Crome, MD;  Location: [facility];  Service: Cardiovascular;  Laterality: N/A;  . CATARACT EXTRACTION, BILATERAL Bilateral    one in feb and one  in March Dr. Tommy Rainwater  . CHOLECYSTECTOMY  1974  . ectopic pregnancy    . OTHER SURGICAL HISTORY     Stomach was not developed at birth    Family History  Problem Relation Age of Onset  . Emphysema Mother        heavy smoker  . Heart disease Mother 59  . Hyperlipidemia Mother   . Hypertension Mother   . Skin cancer Mother        melanomia, spread to her spine  . Arthritis Mother   . Heart disease Father 47  . Prostate cancer Father   . Hyperlipidemia Father   . Hypertension Father   . Colon cancer Neg Hx   . Esophageal cancer Neg Hx   . Rectal cancer Neg Hx   . Breast cancer Neg Hx     Social History   Socioeconomic History  . Marital status: Married    Spouse name: Not on file  . Number of children: 3  . Years of education: 62   . Highest education level: Some college, no degree  Occupational History  . Occupation: retired  Tobacco Use  . Smoking status: Never Smoker  . Smokeless tobacco: Never Used  Vaping Use  . Vaping Use: Never used  Substance and Sexual Activity  . Alcohol use: Yes    Comment: social  . Drug use: Never  . Sexual activity: Not on file  Other Topics Concern  . Not on file  Social History Narrative   Married   [place] 2   Retired    Scientist, physiological Strain: [place]   . Difficulty of Paying Living Expenses: Not hard at all  Food Insecurity: No Food Insecurity  . Worried About Charity fundraiser in the Last Year: Never true  . Ran Out of Food in the Last Year: Never true  Transportation Needs: No Transportation Needs  . Lack of Transportation (Medical): No  . Lack of Transportation (Non-Medical): No  Physical Activity: Inactive  . Days of Exercise per Week: 0 days  . Minutes of Exercise per Session: 0 min  Stress:   . Feeling of Stress : Not on file  Social Connections: Socially Integrated  . Frequency of Communication with Friends and Family: More than three times a week  . Frequency of Social  Gatherings with Friends and Family: Once a week  . Attends Religious Services: More than 4 times per year  . Active Member of Clubs or Organizations: Yes  . Attends Archivist Meetings: More than 4 times per year  . Marital Status: Married  Human resources officer Violence:   . Fear of Current or Ex-Partner: Not on file  . Emotionally Abused: Not on file  . Physically Abused: Not on file  . Sexually Abused: Not on file    Outpatient Medications Prior to Visit  Medication Sig Dispense Refill  . azelastine (ASTELIN) 0.1 % nasal spray Place 2 sprays into both nostrils 2 (two) times daily. Use in each nostril as directed 30 mL 12  . cetirizine (ZYRTEC) 5 MG tablet Take 5 mg by mouth as needed for allergies.    . cholecalciferol (VITAMIN D) 1000 units tablet Take 5,000 Units by mouth daily.     . diclofenac sodium (VOLTAREN) 1 % GEL Apply 2 g topically 4 (four) times daily. 100 g 1  . Ibuprofen-diphenhydrAMINE Cit (ADVIL PM PO) Take 1 tablet by mouth at bedtime.    Marland Kitchen losartan-hydrochlorothiazide (HYZAAR) 100-25 MG tablet TAKE 1/2 TABLET BY MOUTH DAILY 45 tablet 2  . metoprolol succinate (TOPROL-XL) 50 MG 24 hr tablet TAKE ONE TABLET BY MOUTH DAILY WITH OR IMMEDIATELY AFTER A MEAL 90 tablet 2  . metroNIDAZOLE (METROGEL) 0.75 % gel Apply 1 application topically 2 (two) times daily. 45 g 2  . omeprazole (PRILOSEC) 40 MG capsule Take 80 mg by mouth 2 (two) times daily.     . sertraline (ZOLOFT) 50 MG tablet Take 1 tablet (50 mg total) by mouth daily. 90 tablet 3  . albuterol (VENTOLIN HFA) 108 (90 Base) MCG/ACT inhaler Inhale 2 puffs into the lungs every 4 (four) hours as needed for wheezing or shortness of breath. 18 g 0  . Ascorbic Acid (VITAMIN C) 500 MG CAPS Take by mouth 2 (two) times daily.    Marland Kitchen aspirin 81 MG tablet Take 81 mg by mouth daily.      . Pyridoxine HCl (VITAMIN B-6 PO) Take 300 mg by mouth 2 (two) times daily.     No facility-administered medications prior to visit.     Allergies  Allergen Reactions  . Doxycycline Nausea And Vomiting  . Erythromycin Base Other (See Comments)    yeast infection, GI upset  . Naproxen Other (See Comments)    GI upset  . Prednisone Hives    REACTION: hives, anxiety, insomnia  . Scallops [Shellfish Allergy] Nausea And Vomiting    ROS Review of Systems  Constitutional: Negative for  fatigue.  Eyes: Negative for visual disturbance.  Respiratory: Negative for cough, chest tightness, shortness of breath and wheezing.   Cardiovascular: Negative for chest pain, palpitations and leg swelling.  Neurological: Negative for dizziness, seizures, syncope, weakness, light-headedness and headaches.      Objective:    Physical Exam Constitutional:      Appearance: She is well-developed.  Eyes:     Pupils: Pupils are equal, round, and reactive to light.  Neck:     Thyroid: No thyromegaly.     Vascular: No JVD.  Cardiovascular:     Rate and Rhythm: Normal rate and regular rhythm.     Heart sounds: No gallop.   Pulmonary:     Effort: Pulmonary effort is normal. No respiratory distress.     Breath sounds: Normal breath sounds. No wheezing or rales.  Musculoskeletal:     Cervical back: Neck supple.  Neurological:     Mental Status: She is alert.     BP (!) 160/80 (BP Location: Left Arm, Cuff Size: Normal)   Ht 5' 5.75" (1.67 m)   Wt 181 lb (82.1 kg)   BMI 29.44 kg/m  Wt Readings from Last 3 Encounters:  06/26/20 181 lb (82.1 kg)  12/07/19 181 lb 14.4 oz (82.5 kg)  12/01/19 177 lb (80.3 kg)     Health Maintenance Due  Topic Date Due  . MAMMOGRAM  04/09/2020    There are no preventive care reminders to display for this patient.  Lab Results  Component Value Date   TSH 4.09 04/13/2020   Lab Results  Component Value Date   WBC 7.3 08/29/2019   HGB 13.3 08/29/2019   HCT 40.1 08/29/2019   MCV 82.8 08/29/2019   PLT 210.0 08/29/2019   Lab Results  Component Value Date   NA 139 04/13/2020   K 4.0  04/13/2020   CO2 27 04/13/2020   GLUCOSE 92 04/13/2020   BUN 18 04/13/2020   CREATININE 0.71 04/13/2020   BILITOT 1.0 05/21/2020   ALKPHOS 75 08/29/2019   AST 17 05/21/2020   ALT 12 05/21/2020   PROT 6.5 05/21/2020   ALBUMIN 4.1 08/29/2019   CALCIUM 9.6 04/13/2020   GFR 66.54 08/29/2019   Lab Results  Component Value Date   CHOL 230 (H) 05/21/2020   Lab Results  Component Value Date   HDL 67 05/21/2020   Lab Results  Component Value Date   LDLCALC 145 (H) 05/21/2020   Lab Results  Component Value Date   TRIG 76 05/21/2020   Lab Results  Component Value Date   CHOLHDL 3.4 05/21/2020   No results found for: HGBA1C    Assessment & Plan:   #1 hypertension poorly controlled.  Patient did not take her medication this morning which may be playing some affect.  -Get back on her medication and monitor next few days.  Be in touch if consistently greater than 140/90 -Consider titration to full tablet and if not to goal at follow-up -Discussed nonpharmacologic factors and blood pressure control with regular exercise, weight control, sodium reduction  #2 dyslipidemia.  She has high calculated 10-year risk of CAD event of over 25% but normal cardiac cath 2017.  Continue low saturated fat diet.  We will continue to discuss statin use but this point she declines  No orders of the defined types were placed in this encounter.   Follow-up: Return in about 3 months (around 09/24/2020).    Carolann Littler, MD

## 2020-06-26 NOTE — Patient Instructions (Addendum)

## 2020-06-27 ENCOUNTER — Encounter: Payer: Self-pay | Admitting: Family Medicine

## 2020-07-01 ENCOUNTER — Encounter: Payer: Self-pay | Admitting: Family Medicine

## 2020-07-02 MED ORDER — LOSARTAN POTASSIUM-HCTZ 100-25 MG PO TABS: 1.0000 | ORAL_TABLET | Freq: Every day | ORAL | 3 refills | Status: DC

## 2020-07-02 NOTE — Telephone Encounter (Signed)
Agree with increasing losartan HCTZ back to up to 1 full tablet daily.  I have sent in a new prescription to reflect this.

## 2020-07-11 ENCOUNTER — Encounter: Payer: Self-pay | Admitting: Family Medicine

## 2020-07-26 DIAGNOSIS — G5601 Carpal tunnel syndrome, right upper limb: Secondary | ICD-10-CM | POA: Diagnosis not present

## 2020-07-26 DIAGNOSIS — M720 Palmar fascial fibromatosis [Dupuytren]: Secondary | ICD-10-CM | POA: Diagnosis not present

## 2020-07-28 DIAGNOSIS — G5601 Carpal tunnel syndrome, right upper limb: Secondary | ICD-10-CM | POA: Diagnosis not present

## 2020-08-03 DIAGNOSIS — G5601 Carpal tunnel syndrome, right upper limb: Secondary | ICD-10-CM | POA: Insufficient documentation

## 2020-08-11 ENCOUNTER — Encounter: Payer: Self-pay | Admitting: Family Medicine

## 2020-08-13 ENCOUNTER — Other Ambulatory Visit: Payer: Self-pay

## 2020-08-13 MED ORDER — AZELASTINE HCL 0.1 % NA SOLN
2.0000 | Freq: Two times a day (BID) | NASAL | 3 refills | Status: DC
Start: 2020-08-13 — End: 2022-05-19

## 2020-08-20 ENCOUNTER — Telehealth: Payer: Self-pay | Admitting: Family Medicine

## 2020-08-20 NOTE — Telephone Encounter (Signed)
Left message for patient to call back and schedule Medicare Annual Wellness Visit (AWV) either virtually or in office.   Last AWV 08/15/19  please schedule at anytime with [facility] Nurse Health Advisor 1 or 2   This should be a 45 minute visit.  

## 2020-09-04 DIAGNOSIS — G5601 Carpal tunnel syndrome, right upper limb: Secondary | ICD-10-CM | POA: Diagnosis not present

## 2020-09-14 DIAGNOSIS — G5601 Carpal tunnel syndrome, right upper limb: Secondary | ICD-10-CM | POA: Diagnosis not present

## 2020-09-14 DIAGNOSIS — Z4789 Encounter for other orthopedic aftercare: Secondary | ICD-10-CM | POA: Diagnosis not present

## 2020-09-21 DIAGNOSIS — M79641 Pain in right hand: Secondary | ICD-10-CM | POA: Diagnosis not present

## 2020-09-27 ENCOUNTER — Ambulatory Visit: Payer: Medicare HMO

## 2020-09-28 DIAGNOSIS — M79641 Pain in right hand: Secondary | ICD-10-CM | POA: Diagnosis not present

## 2020-10-06 ENCOUNTER — Other Ambulatory Visit: Payer: Self-pay | Admitting: Family Medicine

## 2020-10-09 ENCOUNTER — Other Ambulatory Visit: Payer: Self-pay | Admitting: Family Medicine

## 2020-10-12 DIAGNOSIS — M79641 Pain in right hand: Secondary | ICD-10-CM | POA: Diagnosis not present

## 2020-10-15 NOTE — Progress Notes (Signed)
Subjective:   Rebecca Reid is a 75 y.o. female who presents for Medicare Annual (Subsequent) preventive examination.  I connected with Rebecca Reid today by telephone and verified that I am speaking with the correct person using two identifiers. Location patient: home Location provider: work Persons participating in the virtual visit: patient, provider.   I discussed the limitations, risks, security and privacy concerns of performing an evaluation and management service by telephone and the availability of in person appointments. I also discussed with the patient that there may be a patient responsible charge related to this service. The patient expressed understanding and verbally consented to this telephonic visit.    Interactive audio and video telecommunications were attempted between this provider and patient, however failed, due to patient having technical difficulties OR patient did not have access to video capability.  We continued and completed visit with audio only.      Location: Patient: Home Provider: Office Persons participating in the virtual visit: patient, provider   I discussed the limitations of evaluation and management by telemedicine and the availability of in person appointments. The patient expressed understanding and agreed to proceed.     Randel Pigg, LPN   Review of Systems    Cardiac Risk Factors include: advanced age (>43men, >26 women)     Objective:    There were no vitals filed for this visit. There is no height or weight on file to calculate BMI.  Advanced Directives 10/16/2020 08/15/2019 04/19/2018 04/15/2018 11/02/2015  Does Patient Have a Medical Advance Directive? Yes Yes Yes Yes Yes  Type of Advance Directive - [place];Living will - [place];Living will Living will;Healthcare Power of Attorney  Does patient want to make changes to medical advance directive? No - Patient declined No - Patient  declined - - No - Patient declined  Copy of [place] in Chart? - No - copy requested - No - copy requested No - copy requested    Current Medications (verified) Outpatient Encounter Medications as of 10/16/2020  Medication Sig  . azelastine (ASTELIN) 0.1 % nasal spray Place 2 sprays into both nostrils 2 (two) times daily. Use in each nostril as directed  . cetirizine (ZYRTEC) 5 MG tablet Take 5 mg by mouth as needed for allergies.  . cholecalciferol (VITAMIN D) 1000 units tablet Take 5,000 Units by mouth daily.   . diclofenac sodium (VOLTAREN) 1 % GEL Apply 2 g topically 4 (four) times daily.  . Ibuprofen-diphenhydrAMINE Cit (ADVIL PM PO) Take 1 tablet by mouth at bedtime.  Marland Kitchen losartan-hydrochlorothiazide (HYZAAR) 100-25 MG tablet TAKE ONE-HALF TABLET BY MOUTH ONE TIME DAILY  . metoprolol succinate (TOPROL-XL) 50 MG 24 hr tablet TAKE ONE TABLET BY MOUTH ONE TIME DAILY WITH OR IMMEDIATELY AFTER A MEAL  . metroNIDAZOLE (METROGEL) 0.75 % gel Apply 1 application topically 2 (two) times daily.  Marland Kitchen omeprazole (PRILOSEC) 40 MG capsule Take 80 mg by mouth 2 (two) times daily.   . sertraline (ZOLOFT) 50 MG tablet Take 1 tablet (50 mg total) by mouth daily.   No facility-administered encounter medications on file as of 10/16/2020.    Allergies (verified) Doxycycline, Erythromycin base, Naproxen, Prednisone, and Scallops [shellfish allergy]   History: Past Medical History:  Diagnosis Date  . ALLERGIC RHINITIS CAUSE UNSPECIFIED 04/30/2009  . Anxiety   . DEPRESSION, HX OF 04/11/2010  . Dysuria 05/06/2010  . Gallstones   . GERD 04/30/2009  . HYPERLIPIDEMIA 04/30/2009  . HYPERTENSION 04/30/2009  .  LEG EDEMA 12/12/2009  . OSTEOPENIA 04/30/2009  . PVC (premature ventricular contraction)   . Status post dilation of esophageal narrowing    Past Surgical History:  Procedure Laterality Date  . ABDOMINAL HYSTERECTOMY  1986  . AUGMENTATION MAMMAPLASTY    . BREAST ENHANCEMENT  SURGERY  1987  . BREAST SURGERY  1974   biopsy  . CARDIAC CATHETERIZATION N/A 11/02/2015   Procedure: Left Heart Cath and Coronary Angiography;  Surgeon: Belva Crome, MD;  Location: [facility];  Service: Cardiovascular;  Laterality: N/A;  . Carpel tunel Right   . CATARACT EXTRACTION, BILATERAL Bilateral    one in feb and one in March Dr. Tommy Rainwater  . CHOLECYSTECTOMY  1974  . ectopic pregnancy    . OTHER SURGICAL HISTORY     Stomach was not developed at birth   Family History  Problem Relation Age of Onset  . Emphysema Mother        heavy smoker  . Heart disease Mother 38  . Hyperlipidemia Mother   . Hypertension Mother   . Skin cancer Mother        melanomia, spread to her spine  . Arthritis Mother   . Heart disease Father 32  . Prostate cancer Father   . Hyperlipidemia Father   . Hypertension Father   . Colon cancer Neg Hx   . Esophageal cancer Neg Hx   . Rectal cancer Neg Hx   . Breast cancer Neg Hx    Social History   Socioeconomic History  . Marital status: Married    Spouse name: Not on file  . Number of children: 3  . Years of education: 45   . Highest education level: Some college, no degree  Occupational History  . Occupation: retired  Tobacco Use  . Smoking status: Never Smoker  . Smokeless tobacco: Never Used  Vaping Use  . Vaping Use: Never used  Substance and Sexual Activity  . Alcohol use: Yes    Comment: social  . Drug use: Never  . Sexual activity: Not on file  Other Topics Concern  . 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 Difficulty of Paying Living Expenses: Not hard at all  Food Insecurity: No Food Insecurity  . Worried About Charity fundraiser in the Last Year: Never true  . Ran Out of Food in the Last Year: Never true  Transportation Needs: No Transportation Needs  . Lack of Transportation (Medical): No  . Lack of  Transportation (Non-Medical): No  Physical Activity: Insufficiently Active  . Days of Exercise per Week: 3 days  . Minutes of Exercise per Session: 30 min  Stress: No Stress Concern Present  . Feeling of Stress : Not at all  Social Connections: Moderately Integrated  . Frequency of Communication with Friends and Family: More than three times a week  . Frequency of Social Gatherings with Friends and Family: Twice a week  . Attends Religious Services: More than 4 times per year  . Active Member of Clubs or Organizations: No  . Attends Archivist Meetings: Never  . Marital Status: Married    Tobacco Counseling Counseling given: Not Answered   Clinical Intake:  Pre-visit preparation completed: Yes  Pain : No/denies pain     Nutritional Risks: None Diabetes: No  How often do you need  to have someone help you when you read instructions, pamphlets, or other written materials from your doctor or pharmacy?: 1 - Never What is the last grade level you completed in school?: college  Diabetic?No  Interpreter Needed?: No  Information entered by :: l.Ladon Vandenberghe,LPN   Activities of Daily Living In your present state of health, do you have any difficulty performing the following activities: 10/16/2020  Hearing? N  Vision? N  Difficulty concentrating or making decisions? N  Walking or climbing stairs? N  Dressing or bathing? N  Doing errands, shopping? N  Preparing Food and eating ? N  Using the Toilet? N  In the past six months, have you accidently leaked urine? N  Do you have problems with loss of bowel control? N  Managing your Medications? N  Managing your Finances? N  Housekeeping or managing your Housekeeping? N  Some recent data might be hidden    Patient Care Team: [name], MD as PCP - General Viona Gilmore, [facility] as Pharmacist (Pharmacist)  Indicate any recent Medical Services you may have received from other than [facility] providers in the past year  (date may be approximate).     Assessment:   This is a routine wellness examination for Rebecca Reid.  Hearing/Vision screen  Hearing Screening   125Hz  250Hz  500Hz  1000Hz  2000Hz  3000Hz  4000Hz  6000Hz  8000Hz   Right ear:           Left ear:           Comments: Annual eye exam    Dietary issues and exercise activities discussed: Current Exercise Habits: Home exercise routine, Type of exercise: walking, Time (Minutes): 40, Frequency (Times/Week): 3, Weekly Exercise (Minutes/Week): 120, Intensity: Mild, Exercise limited by: None identified  Goals    . Avoid Covid    . DIET - INCREASE LEAN PROTEINS     Low cholesterol     . Exercise 150 min/wk Moderate Activity      Depression Screen PHQ 2/9 Scores 10/16/2020 08/15/2019 04/19/2018 04/06/2018 03/25/2016 09/12/2015 07/10/2014  PHQ - 2 Score 0 0 0 0 0 0 0  PHQ- 9 Score - - - 1 - - -    Fall Risk Fall Risk  10/16/2020 08/29/2019 08/15/2019 04/06/2018 12/12/2016  Falls in the past year? 0 0 0 Yes Yes  Number falls in past yr: 0 0 - 2 or more 2 or more  Injury with Fall? 0 - - Yes Yes  Comment - - - Minor concussion, 07/2017 -  Risk for fall due to : - History of fall(s) Medication side effect - -  Follow up Falls evaluation completed Follow up appointment Falls evaluation completed;Education provided;Falls prevention discussed - -    FALL RISK PREVENTION PERTAINING TO THE HOME:  Any stairs in or around the home? No  If so, are there any without handrails? Yes  Home free of loose throw rugs in walkways, pet beds, electrical cords, etc? Yes  Adequate lighting in your home to reduce risk of falls? Yes   ASSISTIVE DEVICES UTILIZED TO PREVENT FALLS:  Life alert? No  Use of a cane, walker or w/c? No  Grab bars in the bathroom? Yes  Shower chair or bench in shower? Yes  Elevated toilet seat or a handicapped toilet? Yes       Cognitive Function: Normal cognitive status assessed by direct observation by this Nurse Health Advisor. No abnormalities  found.   MMSE - Mini Mental State Exam 04/19/2018  Not completed: (No Data)  6CIT Screen 08/15/2019  What Year? 0 points  What month? 0 points  What time? 0 points  Count back from 20 0 points  Months in reverse 0 points  Repeat phrase 0 points  Total Score 0    Immunizations Immunization History  Administered Date(s) Administered  . Fluad Quad(high Dose 65+) 03/29/2019, 04/02/2020  . Influenza Split 05/16/2011, 03/31/2012  . Influenza Whole 04/11/2010  . Influenza, High Dose Seasonal PF 03/25/2016, 03/24/2017, 04/06/2018  . Influenza,inj,Quad PF,6+ Mos 03/31/2013, 04/16/2015  . Influenza-Unspecified 05/04/2014  . PFIZER(Purple Top)SARS-COV-2 Vaccination 08/28/2019, 09/21/2019, 04/20/2020  . Pneumococcal Conjugate-13 03/31/2012, 03/25/2016  . Pneumococcal Polysaccharide-23 04/06/2018  . Td 04/20/1989, 04/30/2009  . Tdap 03/31/2019  . Zoster 11/04/2012  . Zoster Recombinat (Shingrix) 08/10/2018, 02/07/2019    TDAP status: Up to date  Flu Vaccine status: Up to date   Pneumococcal vaccine status: Up to date  Covid-19 vaccine status: Completed vaccines    Qualifies for Shingles Vaccine? Yes   Zostavax completed Yes   Shingrix Completed?: Yes  Screening Tests Health Maintenance  Topic Date Due  . MAMMOGRAM  04/09/2020  . Fecal DNA (Cologuard)  06/27/2022  . TETANUS/TDAP  03/30/2029  . INFLUENZA VACCINE  Completed  . DEXA SCAN  Completed  . COVID-19 Vaccine  Completed  . Hepatitis C Screening  Completed  . PNA vac Low Risk Adult  Completed  . HPV VACCINES  Aged Out    Health Maintenance  Health Maintenance Due  Topic Date Due  . MAMMOGRAM  04/09/2020    Colorectal cancer screening: Type of screening: Cologuard. Completed 06/28/2019. Repeat every 3  years  Mammogram status: Ordered 09/26/2020. Pt provided with contact info and advised to call to schedule appt.   Bone Density status: Ordered 10/16/2020. Pt provided with contact info and advised to call  to schedule appt.  Lung Cancer Screening: (Low Dose CT Chest recommended if Age 53-80 years, 30 pack-year currently smoking OR have quit w/in 15years.) does not qualify.   Lung Cancer Screening Referral: N/A  Additional Screening:  Hepatitis C Screening: does not qualify  Vision Screening: Recommended annual ophthalmology exams for early detection of glaucoma and other disorders of the eye. Is the patient up to date with their annual eye exam?  Yes  Who is the provider or what is the name of the office in which the patient attends annual eye exams? Dr.Fulp If pt is not established with a provider, would they like to be referred to a provider to establish care? No .   Dental Screening: Recommended annual dental exams for proper oral hygiene  Community Resource Referral / Chronic Care Management: CRR required this visit?  No   CCM required this visit?  No      Plan:     I have personally reviewed and noted the following in the patient's chart:   . Medical and social history . Use of alcohol, tobacco or illicit drugs  . Current medications and supplements . Functional ability and status . Nutritional status . Physical activity . Advanced directives . List of other physicians . Hospitalizations, surgeries, and ER visits in previous 12 months . Vitals . Screenings to include cognitive, depression, and falls . Referrals and appointments  In addition, I have reviewed and discussed with patient certain preventive protocols, quality metrics, and best practice recommendations. A written personalized care plan for preventive services as well as general preventive health recommendations were provided to patient.     Randel Pigg, LPN   [id number]/40/[id number]  Nurse Notes: None

## 2020-10-16 ENCOUNTER — Ambulatory Visit (INDEPENDENT_AMBULATORY_CARE_PROVIDER_SITE_OTHER): Payer: Medicare HMO

## 2020-10-16 DIAGNOSIS — Z0000 Encounter for general adult medical examination without abnormal findings: Secondary | ICD-10-CM

## 2020-10-16 DIAGNOSIS — Z780 Asymptomatic menopausal state: Secondary | ICD-10-CM | POA: Diagnosis not present

## 2020-10-16 DIAGNOSIS — Z1231 Encounter for screening mammogram for malignant neoplasm of breast: Secondary | ICD-10-CM | POA: Diagnosis not present

## 2020-10-16 NOTE — Patient Instructions (Signed)
Rebecca Reid , Thank you for taking time to come for your Medicare Wellness Visit. I appreciate your ongoing commitment to your health goals. Please review the following plan we discussed and let me know if I can assist you in the future.   Screening recommendations/referrals: Colonoscopy: Due 06/27/2022   Mammogram: Due orders placed  Bone Density: Due orders placed  Recommended yearly ophthalmology/optometry visit for glaucoma screening and checkup Recommended yearly dental visit for hygiene and checkup  Vaccinations: Influenza vaccine: completed due this fall Pneumococcal vaccine: completed series Tdap vaccine: Up to date  Shingles vaccine:  Completed series   Advanced directives: Pt will bring   Conditions/risks identified: None   Next appointment: None   Preventive Care 74 Years and Older, Female Preventive care refers to lifestyle choices and visits with your health care provider that can promote health and wellness. What does preventive care include?  A yearly physical exam. This is also called an annual well check.  Dental exams once or twice a year.  Routine eye exams. Ask your health care provider how often you should have your eyes checked.  Personal lifestyle choices, including:  Daily care of your teeth and gums.  Regular physical activity.  Eating a healthy diet.  Avoiding tobacco and drug use.  Limiting alcohol use.  Practicing safe sex.  Taking low-dose aspirin every day.  Taking vitamin and mineral supplements as recommended by your health care provider. What happens during an annual well check? The services and screenings done by your health care provider during your annual well check will depend on your age, overall health, lifestyle risk factors, and family history of disease. Counseling  Your health care provider may ask you questions about your:  Alcohol use.  Tobacco use.  Drug use.  Emotional well-being.  Home and relationship  well-being.  Sexual activity.  Eating habits.  History of falls.  Memory and ability to understand (cognition).  Work and work Statistician.  Reproductive health. Screening  You may have the following tests or measurements:  Height, weight, and BMI.  Blood pressure.  Lipid and cholesterol levels. These may be checked every 5 years, or more frequently if you are over 58 years old.  Skin check.  Lung cancer screening. You may have this screening every year starting at age 63 if you have a 30-pack-year history of smoking and currently smoke or have quit within the past 15 years.  Fecal occult blood test (FOBT) of the stool. You may have this test every year starting at age 54.  Flexible sigmoidoscopy or colonoscopy. You may have a sigmoidoscopy every 5 years or a colonoscopy every 10 years starting at age 54.  Hepatitis C blood test.  Hepatitis B blood test.  Sexually transmitted disease (STD) testing.  Diabetes screening. This is done by checking your blood sugar (glucose) after you have not eaten for a while (fasting). You may have this done every 1-3 years.  Bone density scan. This is done to screen for osteoporosis. You may have this done starting at age 26.  Mammogram. This may be done every 1-2 years. Talk to your health care provider about how often you should have regular mammograms. Talk with your health care provider about your test results, treatment options, and if necessary, the need for more tests. Vaccines  Your health care provider may recommend certain vaccines, such as:  Influenza vaccine. This is recommended every year.  Tetanus, diphtheria, and acellular pertussis (Tdap, Td) vaccine. You may need a Td  booster every 10 years.  Zoster vaccine. You may need this after age 55.  Pneumococcal 13-valent conjugate (PCV13) vaccine. One dose is recommended after age 47.  Pneumococcal polysaccharide (PPSV23) vaccine. One dose is recommended after age  39. Talk to your health care provider about which screenings and vaccines you need and how often you need them. This information is not intended to replace advice given to you by your health care provider. Make sure you discuss any questions you have with your health care provider. Document Released: 08/03/2015 Document Revised: 03/26/2016 Document Reviewed: 05/08/2015 Elsevier Interactive Patient Education  2017 [employer] Prevention in the Home Falls can cause injuries. They can happen to people of all ages. There are many things you can do to make your home safe and to help prevent falls. What can I do on the outside of my home?  Regularly fix the edges of walkways and driveways and fix any cracks.  Remove anything that might make you trip as you walk through a door, such as a raised step or threshold.  Trim any bushes or trees on the path to your home.  Use bright outdoor lighting.  Clear any walking paths of anything that might make someone trip, such as rocks or tools.  Regularly check to see if handrails are loose or broken. Make sure that both sides of any steps have handrails.  Any raised decks and porches should have guardrails on the edges.  Have any leaves, snow, or ice cleared regularly.  Use sand or salt on walking paths during winter.  Clean up any spills in your garage right away. This includes oil or grease spills. What can I do in the bathroom?  Use night lights.  Install grab bars by the toilet and in the tub and shower. Do not use towel bars as grab bars.  Use non-skid mats or decals in the tub or shower.  If you need to sit down in the shower, use a plastic, non-slip stool.  Keep the floor dry. Clean up any water that spills on the floor as soon as it happens.  Remove soap buildup in the tub or shower regularly.  Attach bath mats securely with double-sided non-slip rug tape.  Do not have throw rugs and other things on the floor that can make  you trip. What can I do in the bedroom?  Use night lights.  Make sure that you have a light by your bed that is easy to reach.  Do not use any sheets or blankets that are too big for your bed. They should not hang down onto the floor.  Have a firm chair that has side arms. You can use this for support while you get dressed.  Do not have throw rugs and other things on the floor that can make you trip. What can I do in the kitchen?  Clean up any spills right away.  Avoid walking on wet floors.  Keep items that you use a lot in easy-to-reach places.  If you need to reach something above you, use a strong step stool that has a grab bar.  Keep electrical cords out of the way.  Do not use floor polish or wax that makes floors slippery. If you must use wax, use non-skid floor wax.  Do not have throw rugs and other things on the floor that can make you trip. What can I do with my stairs?  Do not leave any items on the stairs.  Make sure that there are handrails on both sides of the stairs and use them. Fix handrails that are broken or loose. Make sure that handrails are as long as the stairways.  Check any carpeting to make sure that it is firmly attached to the stairs. Fix any carpet that is loose or worn.  Avoid having throw rugs at the top or bottom of the stairs. If you do have throw rugs, attach them to the floor with carpet tape.  Make sure that you have a light switch at the top of the stairs and the bottom of the stairs. If you do not have them, ask someone to add them for you. What else can I do to help prevent falls?  Wear shoes that:  Do not have high heels.  Have rubber bottoms.  Are comfortable and fit you well.  Are closed at the toe. Do not wear sandals.  If you use a stepladder:  Make sure that it is fully opened. Do not climb a closed stepladder.  Make sure that both sides of the stepladder are locked into place.  Ask someone to hold it for you, if  possible.  Clearly mark and make sure that you can see:  Any grab bars or handrails.  First and last steps.  Where the edge of each step is.  Use tools that help you move around (mobility aids) if they are needed. These include:  Canes.  Walkers.  Scooters.  Crutches.  Turn on the lights when you go into a dark area. Replace any light bulbs as soon as they burn out.  Set up your furniture so you have a clear path. Avoid moving your furniture around.  If any of your floors are uneven, fix them.  If there are any pets around you, be aware of where they are.  Review your medicines with your doctor. Some medicines can make you feel dizzy. This can increase your chance of falling. Ask your doctor what other things that you can do to help prevent falls. This information is not intended to replace advice given to you by your health care provider. Make sure you discuss any questions you have with your health care provider. Document Released: 05/03/2009 Document Revised: 12/13/2015 Document Reviewed: 08/11/2014 Elsevier Interactive Patient Education  2017 [employer].

## 2020-10-17 ENCOUNTER — Encounter: Payer: Self-pay | Admitting: Family Medicine

## 2020-10-17 DIAGNOSIS — E785 Hyperlipidemia, unspecified: Secondary | ICD-10-CM

## 2020-10-17 DIAGNOSIS — M79641 Pain in right hand: Secondary | ICD-10-CM | POA: Diagnosis not present

## 2020-10-17 NOTE — Telephone Encounter (Signed)
Placed future lab order for lipid panel for early May and she will need to schedule if she would like at that point.  I think it is okay to wait for the DEXA

## 2020-11-21 ENCOUNTER — Encounter: Payer: Self-pay | Admitting: Family Medicine

## 2020-11-22 MED ORDER — OMEPRAZOLE 40 MG PO CPDR
40.0000 mg | DELAYED_RELEASE_CAPSULE | Freq: Every day | ORAL | 3 refills | Status: DC
Start: 2020-11-22 — End: 2021-08-07

## 2020-11-22 NOTE — Telephone Encounter (Signed)
I sent in the 40 mg Prilosec one daily

## 2020-11-28 ENCOUNTER — Encounter: Payer: Self-pay | Admitting: Family Medicine

## 2020-11-28 ENCOUNTER — Other Ambulatory Visit: Payer: Self-pay

## 2020-11-28 ENCOUNTER — Ambulatory Visit (INDEPENDENT_AMBULATORY_CARE_PROVIDER_SITE_OTHER): Payer: Medicare HMO | Admitting: Family Medicine

## 2020-11-28 VITALS — BP 122/70 | HR 69 | Temp 97.7°F | Wt 183.8 lb

## 2020-11-28 DIAGNOSIS — I10 Essential (primary) hypertension: Secondary | ICD-10-CM | POA: Diagnosis not present

## 2020-11-28 DIAGNOSIS — E785 Hyperlipidemia, unspecified: Secondary | ICD-10-CM | POA: Diagnosis not present

## 2020-11-28 DIAGNOSIS — R5383 Other fatigue: Secondary | ICD-10-CM

## 2020-11-28 LAB — CBC WITH DIFFERENTIAL/PLATELET
Basophils Absolute: 0 10*3/uL (ref 0.0–0.1)
Basophils Relative: 0.6 % (ref 0.0–3.0)
Eosinophils Absolute: 0.1 10*3/uL (ref 0.0–0.7)
Eosinophils Relative: 1.6 % (ref 0.0–5.0)
HCT: 38.9 % (ref 36.0–46.0)
Hemoglobin: 13.3 g/dL (ref 12.0–15.0)
Lymphocytes Relative: 23.4 % (ref 12.0–46.0)
Lymphs Abs: 1.4 10*3/uL (ref 0.7–4.0)
MCHC: 34.1 g/dL (ref 30.0–36.0)
MCV: 83.1 fl (ref 78.0–100.0)
Monocytes Absolute: 0.6 10*3/uL (ref 0.1–1.0)
Monocytes Relative: 10.7 % (ref 3.0–12.0)
Neutro Abs: 3.7 10*3/uL (ref 1.4–7.7)
Neutrophils Relative %: 63.7 % (ref 43.0–77.0)
Platelets: 209 10*3/uL (ref 150.0–400.0)
RBC: 4.68 Mil/uL (ref 3.87–5.11)
RDW: 15.1 % (ref 11.5–15.5)
WBC: 5.8 10*3/uL (ref 4.0–10.5)

## 2020-11-28 LAB — HEPATIC FUNCTION PANEL
ALT: 16 U/L (ref 0–35)
AST: 19 U/L (ref 0–37)
Albumin: 4.2 g/dL (ref 3.5–5.2)
Alkaline Phosphatase: 75 U/L (ref 39–117)
Bilirubin, Direct: 0.1 mg/dL (ref 0.0–0.3)
Total Bilirubin: 0.8 mg/dL (ref 0.2–1.2)
Total Protein: 6.5 g/dL (ref 6.0–8.3)

## 2020-11-28 LAB — BASIC METABOLIC PANEL
BUN: 17 mg/dL (ref 6–23)
CO2: 29 mEq/L (ref 19–32)
Calcium: 9.4 mg/dL (ref 8.4–10.5)
Chloride: 103 mEq/L (ref 96–112)
Creatinine, Ser: 0.87 mg/dL (ref 0.40–1.20)
GFR: 65.92 mL/min (ref >60.00)
Glucose, Bld: 111 mg/dL — ABNORMAL HIGH (ref 70–99)
Potassium: 3.5 mEq/L (ref 3.5–5.1)
Sodium: 140 mEq/L (ref 135–145)

## 2020-11-28 LAB — LIPID PANEL
Cholesterol: 229 mg/dL — ABNORMAL HIGH (ref 0–200)
HDL: 63.2 mg/dL (ref >39.00)
LDL Cholesterol: 138 mg/dL — ABNORMAL HIGH (ref 0–99)
NonHDL: 165.48
Total CHOL/HDL Ratio: 4
Triglycerides: 137 mg/dL (ref 0.0–149.0)
VLDL: 27.4 mg/dL (ref 0.0–40.0)

## 2020-11-28 MED ORDER — LOSARTAN POTASSIUM-HCTZ 100-25 MG PO TABS: 1.0000 | ORAL_TABLET | Freq: Every day | ORAL | 3 refills | Status: DC

## 2020-11-28 NOTE — Progress Notes (Addendum)
Established Patient Office Visit  Subjective:  Patient ID: Rebecca Reid, female    DOB: [birth year]  Age: 75 y.o. MRN: [id number]  CC:  Chief Complaint  Patient presents with  . Anxiety    X 3 months, sob, chest and throat feel tight, pt states it feels like she needs to take a deep breath but she can't get enough air    HPI Rebecca Reid presents for at least 74-month history of intermittent shortness of breath.  She has absolutely no dyspnea whatsoever with activity such as working around the house and no exertional chest tightness.  She states this is more issue of air hunger sometimes when she is sitting still.  She has had stress issues with her husband's declining health.  Patient had cardiac cath 2017 with completely clear coronaries.  No history of heart failure.  No orthopnea.  No increased peripheral edema.  Occasionally has bilateral upper chest sensation of tightness diffusely.  Nonexertional.  No pleuritic pain.  She also complains of increased fatigue.  She had COVID booster earlier this week and feels fatigue is been worse since then.  She feels like she is sleeping fairly well.  She has history of frequent PVCs.  She has reduced her caffeine use.  Rare alcohol use.  Is on sertraline for anxiety symptoms.  She has hypertension treated with Hyzaar 100/25 mg 1 daily.  Needs refill.  This was increased during the past year from a half a tablet to a whole tablet.  She has history of hyperlipidemia and would like to get repeat lipids at this time.  The 10-year ASCVD risk score Mikey Bussing DC Brooke Bonito., et al., 2013) is: 15.9%   Values used to calculate the score:     Age: 63 years     Sex: Female     Is Non-Hispanic African American: No     Diabetic: No     Tobacco smoker: No     Systolic Blood Pressure: 841 mmHg     Is BP treated: Yes     HDL Cholesterol: 63.2 mg/dL     Total Cholesterol: 229 mg/dL   Past Medical History:  Diagnosis Date  . ALLERGIC RHINITIS CAUSE UNSPECIFIED  04/30/2009  . Anxiety   . DEPRESSION, HX OF 04/11/2010  . Dysuria 05/06/2010  . Gallstones   . GERD 04/30/2009  . HYPERLIPIDEMIA 04/30/2009  . HYPERTENSION 04/30/2009  . LEG EDEMA 12/12/2009  . OSTEOPENIA 04/30/2009  . PVC (premature ventricular contraction)   . Status post dilation of esophageal narrowing     Past Surgical History:  Procedure Laterality Date  . ABDOMINAL HYSTERECTOMY  1986  . AUGMENTATION MAMMAPLASTY    . BREAST ENHANCEMENT SURGERY  1987  . BREAST SURGERY  1974   biopsy  . CARDIAC CATHETERIZATION N/A 11/02/2015   Procedure: Left Heart Cath and Coronary Angiography;  Surgeon: Belva Crome, MD;  Location: [facility];  Service: Cardiovascular;  Laterality: N/A;  . Carpel tunel Right   . CATARACT EXTRACTION, BILATERAL Bilateral    one in feb and one in March Dr. Tommy Rainwater  . CHOLECYSTECTOMY  1974  . ectopic pregnancy    . OTHER SURGICAL HISTORY     Stomach was not developed at birth    Family History  Problem Relation Age of Onset  . Emphysema Mother        heavy smoker  . Heart disease Mother 74  . Hyperlipidemia Mother   . Hypertension Mother   .  Skin cancer Mother        melanomia, spread to her spine  . Arthritis Mother   . Heart disease Father 41  . Prostate cancer Father   . Hyperlipidemia Father   . Hypertension Father   . Colon cancer Neg Hx   . Esophageal cancer Neg Hx   . Rectal cancer Neg Hx   . Breast cancer Neg Hx     Social History   Socioeconomic History  . Marital status: Married    Spouse name: Not on file  . Number of children: 3  . Years of education: 12   . Highest education level: Some college, no degree  Occupational History  . Occupation: retired  Tobacco Use  . Smoking status: Never Smoker  . Smokeless tobacco: Never Used  Vaping Use  . Vaping Use: Never used  Substance and Sexual Activity  . Alcohol use: Yes    Comment: social  . Drug use: Never  . Sexual activity: Not on file  Other Topics Concern  .  Not on file  Social History Narrative   Married   [place] 2   Retired    Scientist, physiological Strain: [place]   . Difficulty of Paying Living Expenses: Not hard at all  Food Insecurity: No Food Insecurity  . Worried About Charity fundraiser in the Last Year: Never true  . Ran Out of Food in the Last Year: Never true  Transportation Needs: No Transportation Needs  . Lack of Transportation (Medical): No  . Lack of Transportation (Non-Medical): No  Physical Activity: Insufficiently Active  . Days of Exercise per Week: 3 days  . Minutes of Exercise per Session: 30 min  Stress: No Stress Concern Present  . Feeling of Stress : Not at all  Social Connections: Moderately Integrated  . Frequency of Communication with Friends and Family: More than three times a week  . Frequency of Social Gatherings with Friends and Family: Twice a week  . Attends Religious Services: More than 4 times per year  . Active Member of Clubs or Organizations: No  . Attends Archivist Meetings: Never  . Marital Status: Married  Human resources officer Violence: Not At Risk  . Fear of Current or Ex-Partner: No  . Emotionally Abused: No  . Physically Abused: No  . Sexually Abused: No    Outpatient Medications Prior to Visit  Medication Sig Dispense Refill  . azelastine (ASTELIN) 0.1 % nasal spray Place 2 sprays into both nostrils 2 (two) times daily. Use in each nostril as directed 30 mL 3  . cetirizine (ZYRTEC) 5 MG tablet Take 5 mg by mouth as needed for allergies.    Marland Kitchen diclofenac sodium (VOLTAREN) 1 % GEL Apply 2 g topically 4 (four) times daily. 100 g 1  . Ibuprofen-diphenhydrAMINE Cit (ADVIL PM PO) Take 1 tablet by mouth at bedtime.    . metoprolol succinate (TOPROL-XL) 50 MG 24 hr tablet TAKE ONE TABLET BY MOUTH ONE TIME DAILY WITH OR IMMEDIATELY AFTER A MEAL 90 tablet 1  . metroNIDAZOLE (METROGEL) 0.75 % gel Apply 1 application topically 2 (two) times daily. 45 g 2  .  omeprazole (PRILOSEC) 40 MG capsule Take 1 capsule (40 mg total) by mouth daily. 90 capsule 3  . sertraline (ZOLOFT) 50 MG tablet Take 1 tablet (50 mg total) by mouth daily. 90 tablet 3  . losartan-hydrochlorothiazide (HYZAAR) 100-25 MG tablet TAKE ONE-HALF TABLET BY MOUTH ONE TIME DAILY  45 tablet 1  . cholecalciferol (VITAMIN D) 1000 units tablet Take 5,000 Units by mouth daily.      No facility-administered medications prior to visit.    Allergies  Allergen Reactions  . Doxycycline Nausea And Vomiting  . Erythromycin Base Other (See Comments)    yeast infection, GI upset  . Naproxen Other (See Comments)    GI upset  . Prednisone Hives    REACTION: hives, anxiety, insomnia  . Scallops [Shellfish Allergy] Nausea And Vomiting    ROS Review of Systems  Constitutional: Positive for fatigue.  Eyes: Negative for visual disturbance.  Respiratory: Positive for shortness of breath. Negative for cough and wheezing.   Cardiovascular: Negative for chest pain, palpitations and leg swelling.  Genitourinary: Negative for dysuria.  Neurological: Negative for dizziness, seizures, syncope, weakness, light-headedness and headaches.      Objective:    Physical Exam Vitals reviewed.  Constitutional:      Appearance: Normal appearance.  Cardiovascular:     Rate and Rhythm: Normal rate and regular rhythm.  Pulmonary:     Effort: Pulmonary effort is normal.     Breath sounds: Normal breath sounds. No rales.  Musculoskeletal:     Cervical back: Neck supple.     Right lower leg: No edema.     Left lower leg: No edema.  Neurological:     Mental Status: She is alert.     BP 122/70 (BP Location: Left Arm, Patient Position: Sitting, Cuff Size: Normal)   Pulse 69   Temp 97.7 F (36.5 C) (Oral)   Wt 183 lb 12.8 oz (83.4 kg)   SpO2 97%   BMI 29.89 kg/m  Wt Readings from Last 3 Encounters:  11/28/20 183 lb 12.8 oz (83.4 kg)  06/26/20 181 lb (82.1 kg)  12/07/19 181 lb 14.4 oz (82.5 kg)      Health Maintenance Due  Topic Date Due  . MAMMOGRAM  04/09/2020    There are no preventive care reminders to display for this patient.  Lab Results  Component Value Date   TSH 4.09 04/13/2020   Lab Results  Component Value Date   WBC 7.3 08/29/2019   HGB 13.3 08/29/2019   HCT 40.1 08/29/2019   MCV 82.8 08/29/2019   PLT 210.0 08/29/2019   Lab Results  Component Value Date   NA 139 04/13/2020   K 4.0 04/13/2020   CO2 27 04/13/2020   GLUCOSE 92 04/13/2020   BUN 18 04/13/2020   CREATININE 0.71 04/13/2020   BILITOT 1.0 05/21/2020   ALKPHOS 75 08/29/2019   AST 17 05/21/2020   ALT 12 05/21/2020   PROT 6.5 05/21/2020   ALBUMIN 4.1 08/29/2019   CALCIUM 9.6 04/13/2020   GFR 66.54 08/29/2019   Lab Results  Component Value Date   CHOL 230 (H) 05/21/2020   Lab Results  Component Value Date   HDL 67 05/21/2020   Lab Results  Component Value Date   LDLCALC 145 (H) 05/21/2020   Lab Results  Component Value Date   TRIG 76 05/21/2020   Lab Results  Component Value Date   CHOLHDL 3.4 05/21/2020   No results found for: HGBA1C    Assessment & Plan:   #1 fatigue.  Likely multifactorial.  She had TSH last fall which was normal.  Seems to be sleeping fairly well.  Does have increased stress issues.  No exercise intolerance.  -Check further labs with basic metabolic panel, CBC -Reduce high glycemic and white starchy foods -Watch out  for any new specific symptoms  #2 hypertension stable and at goal -Refill Hyzaar for 1 year -Check basic metabolic panel  #3 hyperlipidemia. -Recheck lipids  #4 sensation of air hunger.  This never occurs with activity only at rest.  Suspect psychogenic air hunger.  Normal cardiac and lung exam.  Normal cardiac cath 2017.  No suspicion for chronic lung disease.  Meds ordered this encounter  Medications  . losartan-hydrochlorothiazide (HYZAAR) 100-25 MG tablet    Sig: Take 1 tablet by mouth daily.    Dispense:  90 tablet     Refill:  3    Follow-up: No follow-ups on file.    Carolann Littler, MD

## 2020-11-28 NOTE — Patient Instructions (Signed)

## 2020-11-29 ENCOUNTER — Encounter: Payer: Self-pay | Admitting: Family Medicine

## 2020-11-29 MED ORDER — ROSUVASTATIN CALCIUM 10 MG PO TABS: 10.0000 mg | ORAL_TABLET | Freq: Every day | ORAL | 0 refills | Status: DC

## 2020-11-29 NOTE — Addendum Note (Signed)
Addended by: Agnes Lawrence on: 11/29/2020 11:34 AM   Modules accepted: Orders

## 2020-11-30 ENCOUNTER — Other Ambulatory Visit (HOSPITAL_BASED_OUTPATIENT_CLINIC_OR_DEPARTMENT_OTHER): Payer: Self-pay

## 2020-12-03 ENCOUNTER — Encounter: Payer: Self-pay | Admitting: Family Medicine

## 2020-12-07 ENCOUNTER — Encounter: Payer: Self-pay | Admitting: Family Medicine

## 2020-12-07 ENCOUNTER — Other Ambulatory Visit (HOSPITAL_BASED_OUTPATIENT_CLINIC_OR_DEPARTMENT_OTHER): Payer: Self-pay

## 2020-12-07 ENCOUNTER — Telehealth (INDEPENDENT_AMBULATORY_CARE_PROVIDER_SITE_OTHER): Payer: Medicare HMO | Admitting: Family Medicine

## 2020-12-07 DIAGNOSIS — U071 COVID-19: Secondary | ICD-10-CM

## 2020-12-07 MED ORDER — MOLNUPIRAVIR 200 MG PO CAPS
4.0000 | ORAL_CAPSULE | Freq: Two times a day (BID) | ORAL | 0 refills | Status: DC
  Filled 2020-12-07: qty 40, 5d supply, fill #0

## 2020-12-07 NOTE — Progress Notes (Signed)
Patient ID: Rebecca Reid, female   DOB: 1946/05/22, 75 y.o.   MRN: [id number]   This visit type was conducted due to national recommendations for restrictions regarding the COVID-19 pandemic in an effort to limit this patient's exposure and mitigate transmission in our community.   Virtual Visit via Video Note  I connected with Rebecca Reid on 12/07/20 at  2:15 PM EDT by a video enabled telemedicine application and verified that I am speaking with the correct person using two identifiers.  Location patient: home Location provider:work or home office Persons participating in the virtual visit: patient, provider  I discussed the limitations of evaluation and management by telemedicine and the availability of in person appointments. The patient expressed understanding and agreed to proceed.   HPI:  Rebecca Reid has COVID-19.  Her husband was diagnosed last week.  She developed symptoms of cough Tuesday and since then has developed body aches and increasing fatigue and some nasal congestion.  No respiratory distress.  She has been vaccinated and in fact got her second booster recently about couple weeks ago.  She feels slightly better today but still very exhausted.  No fever.  Her chronic problems include history of hypertension, GERD, osteopenia, hyperlipidemia  ROS: See pertinent positives and negatives per HPI.  Past Medical History:  Diagnosis Date  . ALLERGIC RHINITIS CAUSE UNSPECIFIED 04/30/2009  . Anxiety   . DEPRESSION, HX OF 04/11/2010  . Dysuria 05/06/2010  . Gallstones   . GERD 04/30/2009  . HYPERLIPIDEMIA 04/30/2009  . HYPERTENSION 04/30/2009  . LEG EDEMA 12/12/2009  . OSTEOPENIA 04/30/2009  . PVC (premature ventricular contraction)   . Status post dilation of esophageal narrowing     Past Surgical History:  Procedure Laterality Date  . ABDOMINAL HYSTERECTOMY  1986  . AUGMENTATION MAMMAPLASTY    . BREAST ENHANCEMENT SURGERY  1987  . BREAST SURGERY  1974   biopsy  .  CARDIAC CATHETERIZATION N/A 11/02/2015   Procedure: Left Heart Cath and Coronary Angiography;  Surgeon: Belva Crome, MD;  Location: [facility];  Service: Cardiovascular;  Laterality: N/A;  . Carpel tunel Right   . CATARACT EXTRACTION, BILATERAL Bilateral    one in feb and one in March Dr. Tommy Rainwater  . CHOLECYSTECTOMY  1974  . ectopic pregnancy    . OTHER SURGICAL HISTORY     Stomach was not developed at birth    Family History  Problem Relation Age of Onset  . Emphysema Mother        heavy smoker  . Heart disease Mother 45  . Hyperlipidemia Mother   . Hypertension Mother   . Skin cancer Mother        melanomia, spread to her spine  . Arthritis Mother   . Heart disease Father 43  . Prostate cancer Father   . Hyperlipidemia Father   . Hypertension Father   . Colon cancer Neg Hx   . Esophageal cancer Neg Hx   . Rectal cancer Neg Hx   . Breast cancer Neg Hx     SOCIAL HX: Non-smoker   Current Outpatient Medications:  .  azelastine (ASTELIN) 0.1 % nasal spray, Place 2 sprays into both nostrils 2 (two) times daily. Use in each nostril as directed, Disp: 30 mL, Rfl: 3 .  cetirizine (ZYRTEC) 5 MG tablet, Take 5 mg by mouth as needed for allergies., Disp: , Rfl:  .  diclofenac sodium (VOLTAREN) 1 % GEL, Apply 2 g topically 4 (four) times daily., Disp: 100  g, Rfl: 1 .  Ibuprofen-diphenhydrAMINE Cit (ADVIL PM PO), Take 1 tablet by mouth at bedtime., Disp: , Rfl:  .  losartan-hydrochlorothiazide (HYZAAR) 100-25 MG tablet, Take 1 tablet by mouth daily., Disp: 90 tablet, Rfl: 3 .  metoprolol succinate (TOPROL-XL) 50 MG 24 hr tablet, TAKE ONE TABLET BY MOUTH ONE TIME DAILY WITH OR IMMEDIATELY AFTER A MEAL, Disp: 90 tablet, Rfl: 1 .  metroNIDAZOLE (METROGEL) 0.75 % gel, Apply 1 application topically 2 (two) times daily., Disp: 45 g, Rfl: 2 .  Molnupiravir 200 MG CAPS, Take 4 capsules (800 mg total) by mouth in the morning and at bedtime., Disp: 40 capsule, Rfl: 0 .  omeprazole  (PRILOSEC) 40 MG capsule, Take 1 capsule (40 mg total) by mouth daily., Disp: 90 capsule, Rfl: 3 .  rosuvastatin (CRESTOR) 10 MG tablet, Take 1 tablet (10 mg total) by mouth daily., Disp: 90 tablet, Rfl: 0 .  sertraline (ZOLOFT) 50 MG tablet, Take 1 tablet (50 mg total) by mouth daily., Disp: 90 tablet, Rfl: 3  EXAM:  VITALS per patient if applicable:  GENERAL: alert, oriented, appears well and in no acute distress  HEENT: atraumatic, conjunttiva clear, no obvious abnormalities on inspection of external nose and ears  NECK: normal movements of the head and neck  LUNGS: on inspection no signs of respiratory distress, breathing rate appears normal, no obvious gross SOB, gasping or wheezing  CV: no obvious cyanosis  MS: moves all visible extremities without noticeable abnormality  PSYCH/NEURO: pleasant and cooperative, no obvious depression or anxiety, speech and thought processing grossly intact  ASSESSMENT AND PLAN:  Discussed the following assessment and plan:  COVID-19 infection.  Patient has been fully immunized.  No respiratory distress.  She specifically had interest in antiviral therapies.  She is within 5-day window.  She has moderate risk.  We agreed to starting Molnupiravir 800 mg twice daily for 5 days -Follow-up immediately for any increased shortness of breath or other concerns      I discussed the assessment and treatment plan with the patient. The patient was provided an opportunity to ask questions and all were answered. The patient agreed with the plan and demonstrated an understanding of the instructions.   The patient was advised to call back or seek an in-person evaluation if the symptoms worsen or if the condition fails to improve as anticipated.     Carolann Littler, MD

## 2020-12-10 ENCOUNTER — Other Ambulatory Visit (HOSPITAL_BASED_OUTPATIENT_CLINIC_OR_DEPARTMENT_OTHER): Payer: Medicare HMO

## 2020-12-10 ENCOUNTER — Inpatient Hospital Stay (HOSPITAL_BASED_OUTPATIENT_CLINIC_OR_DEPARTMENT_OTHER): Admission: RE | Admit: 2020-12-10 | Payer: Medicare HMO | Source: Ambulatory Visit

## 2020-12-25 ENCOUNTER — Ambulatory Visit (HOSPITAL_BASED_OUTPATIENT_CLINIC_OR_DEPARTMENT_OTHER)
Admission: RE | Admit: 2020-12-25 | Discharge: 2020-12-25 | Disposition: A | Discharge status: Home / Self Care | Payer: Medicare HMO | Source: Ambulatory Visit | Attending: Family Medicine | Admitting: Family Medicine

## 2020-12-25 ENCOUNTER — Encounter (HOSPITAL_BASED_OUTPATIENT_CLINIC_OR_DEPARTMENT_OTHER): Payer: Self-pay

## 2020-12-25 ENCOUNTER — Other Ambulatory Visit: Payer: Self-pay

## 2020-12-25 DIAGNOSIS — Z0000 Encounter for general adult medical examination without abnormal findings: Secondary | ICD-10-CM

## 2020-12-25 DIAGNOSIS — Z780 Asymptomatic menopausal state: Secondary | ICD-10-CM

## 2020-12-25 DIAGNOSIS — M85832 Other specified disorders of bone density and structure, left forearm: Secondary | ICD-10-CM | POA: Diagnosis not present

## 2020-12-25 DIAGNOSIS — M85852 Other specified disorders of bone density and structure, left thigh: Secondary | ICD-10-CM | POA: Diagnosis not present

## 2020-12-25 DIAGNOSIS — Z1231 Encounter for screening mammogram for malignant neoplasm of breast: Secondary | ICD-10-CM | POA: Insufficient documentation

## 2020-12-27 DIAGNOSIS — G5601 Carpal tunnel syndrome, right upper limb: Secondary | ICD-10-CM | POA: Diagnosis not present

## 2020-12-27 DIAGNOSIS — M720 Palmar fascial fibromatosis [Dupuytren]: Secondary | ICD-10-CM | POA: Diagnosis not present

## 2021-01-02 ENCOUNTER — Other Ambulatory Visit: Payer: Self-pay | Admitting: Family Medicine

## 2021-01-06 ENCOUNTER — Other Ambulatory Visit: Payer: Self-pay | Admitting: Family Medicine

## 2021-02-04 ENCOUNTER — Other Ambulatory Visit (INDEPENDENT_AMBULATORY_CARE_PROVIDER_SITE_OTHER): Payer: Medicare HMO

## 2021-02-04 ENCOUNTER — Other Ambulatory Visit: Payer: Self-pay

## 2021-02-04 DIAGNOSIS — E785 Hyperlipidemia, unspecified: Secondary | ICD-10-CM | POA: Diagnosis not present

## 2021-02-04 LAB — LIPID PANEL
Cholesterol: 147 mg/dL (ref 0–200)
HDL: 60.9 mg/dL (ref >39.00)
LDL Cholesterol: 66 mg/dL (ref 0–99)
NonHDL: 86.43
Total CHOL/HDL Ratio: 2
Triglycerides: 103 mg/dL (ref 0.0–149.0)
VLDL: 20.6 mg/dL (ref 0.0–40.0)

## 2021-02-04 LAB — HEPATIC FUNCTION PANEL
ALT: 13 U/L (ref 0–35)
AST: 17 U/L (ref 0–37)
Albumin: 4.4 g/dL (ref 3.5–5.2)
Alkaline Phosphatase: 69 U/L (ref 39–117)
Bilirubin, Direct: 0.2 mg/dL (ref 0.0–0.3)
Total Bilirubin: 0.9 mg/dL (ref 0.2–1.2)
Total Protein: 6.6 g/dL (ref 6.0–8.3)

## 2021-02-11 DIAGNOSIS — M720 Palmar fascial fibromatosis [Dupuytren]: Secondary | ICD-10-CM | POA: Diagnosis not present

## 2021-02-11 DIAGNOSIS — G5601 Carpal tunnel syndrome, right upper limb: Secondary | ICD-10-CM | POA: Diagnosis not present

## 2021-02-18 ENCOUNTER — Encounter: Payer: Self-pay | Admitting: Family Medicine

## 2021-02-22 ENCOUNTER — Encounter: Payer: Self-pay | Admitting: Family Medicine

## 2021-02-24 ENCOUNTER — Other Ambulatory Visit: Payer: Self-pay | Admitting: Family Medicine

## 2021-02-25 ENCOUNTER — Other Ambulatory Visit: Payer: Self-pay | Admitting: Family Medicine

## 2021-02-25 MED ORDER — METRONIDAZOLE 0.75 % EX GEL: 1.0000 "application " | Freq: Two times a day (BID) | CUTANEOUS | 2 refills | Status: DC

## 2021-02-25 NOTE — Telephone Encounter (Signed)
Please advise. Which medication is for her rosacea?

## 2021-03-11 ENCOUNTER — Other Ambulatory Visit: Payer: Self-pay

## 2021-03-18 ENCOUNTER — Encounter: Payer: Self-pay | Admitting: Family Medicine

## 2021-03-18 ENCOUNTER — Ambulatory Visit: Payer: Medicare HMO | Admitting: Podiatry

## 2021-03-20 ENCOUNTER — Ambulatory Visit (INDEPENDENT_AMBULATORY_CARE_PROVIDER_SITE_OTHER): Payer: Medicare HMO

## 2021-03-20 ENCOUNTER — Other Ambulatory Visit: Payer: Self-pay

## 2021-03-20 ENCOUNTER — Ambulatory Visit: Payer: Medicare HMO | Admitting: Podiatry

## 2021-03-20 DIAGNOSIS — H43813 Vitreous degeneration, bilateral: Secondary | ICD-10-CM | POA: Diagnosis not present

## 2021-03-20 DIAGNOSIS — M722 Plantar fascial fibromatosis: Secondary | ICD-10-CM | POA: Diagnosis not present

## 2021-03-20 MED ORDER — MELOXICAM 15 MG PO TABS: 15.0000 mg | ORAL_TABLET | Freq: Every day | ORAL | 1 refills | Status: DC

## 2021-03-20 MED ORDER — BETAMETHASONE SOD PHOS & ACET 6 (3-3) MG/ML IJ SUSP
3.0000 mg | Freq: Once | INTRAMUSCULAR | Status: AC
  Administered 2021-03-20: 3 mg via INTRA_ARTICULAR

## 2021-03-20 NOTE — Progress Notes (Signed)
   Subjective: 75 y.o. female presenting today for evaluation of right foot pain and heel pain this been going on for few months now.  Sudden onset.  She denies a history of injury.  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  At the end of the shift her feet hurt.  She is worn compression socks and soaking with no relief.   Past Medical History:  Diagnosis Date   ALLERGIC RHINITIS CAUSE UNSPECIFIED 04/30/2009   Anxiety    DEPRESSION, HX OF 04/11/2010   Dysuria 05/06/2010   Gallstones    GERD 04/30/2009   HYPERLIPIDEMIA 04/30/2009   HYPERTENSION 04/30/2009   LEG EDEMA 12/12/2009   OSTEOPENIA 04/30/2009   PVC (premature ventricular contraction)    Status post dilation of esophageal narrowing      Objective: Physical Exam General: The patient is alert and oriented x3 in no acute distress.  Dermatology: Skin is warm, dry and supple bilateral lower extremities. Negative for open lesions or macerations bilateral.   Vascular: Dorsalis Pedis and Posterior Tibial pulses palpable bilateral.  Capillary fill time is immediate to all digits.  Neurological: Epicritic and protective threshold intact bilateral.   Musculoskeletal: Tenderness to palpation to the plantar aspect of the right heel along the plantar fascia. All other joints range of motion within normal limits bilateral. Strength 5/5 in all groups bilateral.   Radiographic exam: Normal osseous mineralization. Joint spaces preserved. No fracture/dislocation/boney destruction. No other soft tissue abnormalities or radiopaque foreign bodies.   Assessment: 1. Plantar fasciitis right  Plan of Care:  1. Patient evaluated. Xrays reviewed.   2. Injection of 0.5cc Celestone soluspan injected into the right plantar fascia  3.  Patient declined Medrol Dosepak.  She says that she has ill side effects.   4. Rx for Meloxicam ordered for patient. 5.  Continue wearing good shoes with good arch supports from the shoe  market [address] 6. Instructed patient regarding therapies and modalities at home to alleviate symptoms.  7. Return to clinic in 4 weeks.     Edrick Kins, DPM [facility]  Dr. Edrick Kins, DPM    [address], [place] [address]                Office [phone]  Fax [phone]

## 2021-03-26 ENCOUNTER — Encounter: Payer: Self-pay | Admitting: Family Medicine

## 2021-03-27 ENCOUNTER — Other Ambulatory Visit: Payer: Medicare HMO

## 2021-03-27 ENCOUNTER — Ambulatory Visit: Payer: Medicare HMO

## 2021-04-02 ENCOUNTER — Other Ambulatory Visit: Payer: Self-pay | Admitting: Family Medicine

## 2021-04-22 ENCOUNTER — Ambulatory Visit (INDEPENDENT_AMBULATORY_CARE_PROVIDER_SITE_OTHER): Payer: Medicare HMO

## 2021-04-22 ENCOUNTER — Other Ambulatory Visit: Payer: Self-pay

## 2021-04-22 ENCOUNTER — Ambulatory Visit: Payer: Medicare HMO | Admitting: Podiatry

## 2021-04-22 DIAGNOSIS — M722 Plantar fascial fibromatosis: Secondary | ICD-10-CM | POA: Diagnosis not present

## 2021-04-22 DIAGNOSIS — Z23 Encounter for immunization: Secondary | ICD-10-CM

## 2021-04-24 NOTE — Progress Notes (Signed)
   Subjective: 75 y.o. female presenting today for follow-up evaluation of plantar fasciitis to the right foot this been going on for few months now.  Patient states that she feels significant improvement.  She says the injection helped significantly and she only took the meloxicam as needed.  Overall there is a significant amount of improvement and she is very satisfied.  Past Medical History:  Diagnosis Date   ALLERGIC RHINITIS CAUSE UNSPECIFIED 04/30/2009   Anxiety    DEPRESSION, HX OF 04/11/2010   Dysuria 05/06/2010   Gallstones    GERD 04/30/2009   HYPERLIPIDEMIA 04/30/2009   HYPERTENSION 04/30/2009   LEG EDEMA 12/12/2009   OSTEOPENIA 04/30/2009   PVC (premature ventricular contraction)    Status post dilation of esophageal narrowing      Objective: Physical Exam General: The patient is alert and oriented x3 in no acute distress.  Dermatology: Skin is warm, dry and supple bilateral lower extremities. Negative for open lesions or macerations bilateral.   Vascular: Dorsalis Pedis and Posterior Tibial pulses palpable bilateral.  Capillary fill time is immediate to all digits.  Neurological: Epicritic and protective threshold intact bilateral.   Musculoskeletal: Minimal tenderness to palpation to the plantar aspect of the right heel along the plantar fascia. All other joints range of motion within normal limits bilateral. Strength 5/5 in all groups bilateral.    Assessment: 1. Plantar fasciitis right  Plan of Care:  1. Patient evaluated.  2.  Continue meloxicam 15 mg daily as needed 3.  Continue good supportive shoes from the shoe market on [address]. 4.  Continue OTC Tylenol as needed.  Patient states that she gets most relief from Tylenol 5.  Return to clinic as needed  Edrick Kins, DPM [facility]  Dr. Edrick Kins, DPM    [address], [place] [address]                Office [phone]  Fax  [phone]

## 2021-05-08 ENCOUNTER — Other Ambulatory Visit: Payer: Self-pay

## 2021-05-08 ENCOUNTER — Ambulatory Visit: Payer: Medicare HMO | Admitting: Podiatry

## 2021-05-08 DIAGNOSIS — M722 Plantar fascial fibromatosis: Secondary | ICD-10-CM

## 2021-05-08 DIAGNOSIS — H04123 Dry eye syndrome of bilateral lacrimal glands: Secondary | ICD-10-CM | POA: Diagnosis not present

## 2021-05-08 MED ORDER — TRAMADOL HCL 50 MG PO TABS: 50.0000 mg | ORAL_TABLET | Freq: Four times a day (QID) | ORAL | 0 refills | Status: AC | PRN

## 2021-05-09 ENCOUNTER — Encounter: Payer: Self-pay | Admitting: Podiatry

## 2021-05-17 DIAGNOSIS — M722 Plantar fascial fibromatosis: Secondary | ICD-10-CM | POA: Diagnosis not present

## 2021-05-17 MED ORDER — BETAMETHASONE SOD PHOS & ACET 6 (3-3) MG/ML IJ SUSP
3.0000 mg | Freq: Once | INTRAMUSCULAR | Status: AC
  Administered 2021-05-17: 3 mg via INTRA_ARTICULAR

## 2021-05-17 NOTE — Progress Notes (Signed)
   Subjective: 75 y.o. female presenting today for follow-up evaluation of plantar fasciitis to the right foot this been going on for few months now.  Patient states that she feels significant improvement.  She says the injection helped significantly and she only took the meloxicam as needed.  Overall there is a significant amount of improvement and she is very satisfied.  Past Medical History:  Diagnosis Date   ALLERGIC RHINITIS CAUSE UNSPECIFIED 04/30/2009   Anxiety    DEPRESSION, HX OF 04/11/2010   Dysuria 05/06/2010   Gallstones    GERD 04/30/2009   HYPERLIPIDEMIA 04/30/2009   HYPERTENSION 04/30/2009   LEG EDEMA 12/12/2009   OSTEOPENIA 04/30/2009   PVC (premature ventricular contraction)    Status post dilation of esophageal narrowing      Objective: Physical Exam General: The patient is alert and oriented x3 in no acute distress.  Dermatology: Skin is warm, dry and supple bilateral lower extremities. Negative for open lesions or macerations bilateral.   Vascular: Dorsalis Pedis and Posterior Tibial pulses palpable bilateral.  Capillary fill time is immediate to all digits.  Neurological: Epicritic and protective threshold intact bilateral.   Musculoskeletal: Minimal tenderness to palpation to the plantar aspect of the right heel along the plantar fascia. All other joints range of motion within normal limits bilateral. Strength 5/5 in all groups bilateral.    Assessment: 1. Plantar fasciitis right  Plan of Care:  1. Patient evaluated.  2.  Continue meloxicam 15 mg daily as needed 3.  Continue good supportive shoes from the shoe market on [address]. 4.  OTC power step insoles were provided for the patient to support the arches of her feet and alleviate pressure from the heels  5.  Injection of 0.5 cc Celestone Soluspan injection the plantar fascial right 6.  Prescription for tramadol 50 mg as needed 7.  Patient states that the meloxicam did not help 8.  Return to  clinic as needed  *Going to [place] w/ a group of girlfriends  Edrick Kins, DPM [facility]  Dr. Edrick Kins, DPM    [address], [place] [address]                Office [phone]  Fax [phone]

## 2021-05-22 ENCOUNTER — Encounter: Payer: Self-pay | Admitting: Family Medicine

## 2021-05-27 ENCOUNTER — Encounter: Payer: Self-pay | Admitting: Family Medicine

## 2021-06-28 ENCOUNTER — Other Ambulatory Visit: Payer: Self-pay | Admitting: Family Medicine

## 2021-07-24 ENCOUNTER — Ambulatory Visit: Payer: Medicare HMO | Admitting: Podiatry

## 2021-08-05 ENCOUNTER — Encounter: Payer: Self-pay | Admitting: Family Medicine

## 2021-08-05 ENCOUNTER — Other Ambulatory Visit: Payer: Self-pay

## 2021-08-05 ENCOUNTER — Ambulatory Visit: Payer: Medicare HMO | Admitting: Podiatry

## 2021-08-05 ENCOUNTER — Emergency Department (HOSPITAL_BASED_OUTPATIENT_CLINIC_OR_DEPARTMENT_OTHER): Payer: Medicare HMO

## 2021-08-05 ENCOUNTER — Emergency Department (HOSPITAL_BASED_OUTPATIENT_CLINIC_OR_DEPARTMENT_OTHER)
Admission: EM | Admit: 2021-08-05 | Discharge: 2021-08-05 | Disposition: A | Discharge status: Home / Self Care | Payer: Medicare HMO | Attending: Emergency Medicine | Admitting: Emergency Medicine

## 2021-08-05 ENCOUNTER — Encounter (HOSPITAL_BASED_OUTPATIENT_CLINIC_OR_DEPARTMENT_OTHER): Payer: Self-pay

## 2021-08-05 DIAGNOSIS — R519 Headache, unspecified: Secondary | ICD-10-CM

## 2021-08-05 DIAGNOSIS — I10 Essential (primary) hypertension: Secondary | ICD-10-CM | POA: Diagnosis not present

## 2021-08-05 DIAGNOSIS — Z79899 Other long term (current) drug therapy: Secondary | ICD-10-CM | POA: Diagnosis not present

## 2021-08-05 MED ORDER — DIPHENHYDRAMINE HCL 25 MG PO CAPS
25.0000 mg | ORAL_CAPSULE | Freq: Once | ORAL | Status: AC
  Administered 2021-08-05: 25 mg via ORAL
  Filled 2021-08-05: qty 1

## 2021-08-05 MED ORDER — DEXAMETHASONE SODIUM PHOSPHATE 10 MG/ML IJ SOLN
10.0000 mg | Freq: Once | INTRAMUSCULAR | Status: AC
  Administered 2021-08-05: 10 mg via INTRAMUSCULAR
  Filled 2021-08-05: qty 1

## 2021-08-05 MED ORDER — PROCHLORPERAZINE EDISYLATE 10 MG/2ML IJ SOLN
10.0000 mg | Freq: Once | INTRAMUSCULAR | Status: AC
  Administered 2021-08-05: 10 mg via INTRAMUSCULAR
  Filled 2021-08-05: qty 2

## 2021-08-05 NOTE — ED Notes (Signed)
ED Provider at bedside. 

## 2021-08-05 NOTE — ED Triage Notes (Signed)
Pt c/o HA x 5 days-states she checked her BP and it has been elevated-compliant HTN-states she was advised by PCP triage nurse to come to the ED-NAD-steady gait

## 2021-08-05 NOTE — ED Notes (Signed)
Manual NBP at rt arm 178/94 Manual NBP at lt arm 180/100  BEFAST/ VAN Negative GCS 15

## 2021-08-05 NOTE — Discharge Instructions (Signed)
Blood pressure has improved following medications for your headache.  Overall suspect sinus type headache.  Follow-up with your primary care doctor.  Continue Tylenol and ibuprofen for pain.

## 2021-08-05 NOTE — ED Provider Notes (Signed)
[facility] EMERGENCY DEPARTMENT Provider Note   CSN: [id number] Arrival date & time: 08/05/21  1223     History  Chief Complaint  Patient presents with   Hypertension    Rebecca Reid is a 76 y.o. female.  Sinus type headache over the last 5 days.  Feeling rundown.  Noticed that her blood pressure was high.  Denies any weakness or numbness.  No chest pain.  The history is provided by the patient.  Migraine This is a new problem. The current episode started more than 2 days ago. The problem occurs daily. The problem has not changed since onset.Associated symptoms include headaches. Pertinent negatives include no chest pain, no abdominal pain and no shortness of breath. Nothing aggravates the symptoms. Nothing relieves the symptoms. She has tried acetaminophen for the symptoms. The treatment provided no relief.      Home Medications Prior to Admission medications   Medication Sig Start Date End Date Taking? Authorizing Provider  azelastine (ASTELIN) 0.1 % nasal spray Place 2 sprays into both nostrils 2 (two) times daily. Use in each nostril as directed 08/13/20   Burchette, Alinda Sierras, MD  cetirizine (ZYRTEC) 5 MG tablet Take 5 mg by mouth as needed for allergies.    [provider]  diclofenac sodium (VOLTAREN) 1 % GEL Apply 2 g topically 4 (four) times daily. 01/01/18   Burchette, Alinda Sierras, MD  Ibuprofen-diphenhydrAMINE Cit (ADVIL PM PO) Take 1 tablet by mouth at bedtime.    [provider]  losartan-hydrochlorothiazide (HYZAAR) 100-25 MG tablet TAKE ONE-HALF TABLET BY MOUTH ONE TIME DAILY 02/25/21   Burchette, Alinda Sierras, MD  meloxicam (MOBIC) 15 MG tablet Take 1 tablet (15 mg total) by mouth daily. 03/20/21   Edrick Kins, DPM  metoprolol succinate (TOPROL-XL) 50 MG 24 hr tablet TAKE ONE TABLET BY MOUTH ONE TIME DAILY WITH OR IMMEDIATELY AFTER A MEAL 04/02/21   Burchette, Alinda Sierras, MD  metroNIDAZOLE (METROGEL) 0.75 % gel Apply 1 application topically 2 (two)  times daily. 02/25/21   Burchette, Alinda Sierras, MD  Molnupiravir 200 MG CAPS Take 4 capsules (800 mg total) by mouth in the morning and at bedtime. 12/07/20   Burchette, Alinda Sierras, MD  omeprazole (PRILOSEC) 40 MG capsule Take 1 capsule (40 mg total) by mouth daily. 11/22/20   Burchette, Alinda Sierras, MD  PFIZER-BIONT COVID-19 VAC-TRIS SUSP injection  11/26/20   [provider]  rosuvastatin (CRESTOR) 10 MG tablet TAKE ONE TABLET BY MOUTH ONE TIME DAILY 07/01/21   Burchette, Alinda Sierras, MD  sertraline (ZOLOFT) 50 MG tablet TAKE ONE TABLET BY MOUTH ONE TIME DAILY 04/02/21   Burchette, Alinda Sierras, MD      Allergies    Doxycycline, Erythromycin base, Naproxen, Prednisone, and Scallops [shellfish allergy]    Review of Systems   Review of Systems  Respiratory:  Negative for shortness of breath.   Cardiovascular:  Negative for chest pain.  Gastrointestinal:  Negative for abdominal pain.  Neurological:  Positive for headaches.   Physical Exam Updated Vital Signs BP (!) 167/72    Pulse 63    Temp 97.7 F (36.5 C) (Oral)    Resp 20    Ht 5\' 6"  (1.676 m)    Wt 82.6 kg    SpO2 97%    BMI 29.38 kg/m  Physical Exam Vitals and nursing note reviewed.  Constitutional:      General: She is not in acute distress.    Appearance: She is well-developed. She  is not ill-appearing.  HENT:     Head: Normocephalic and atraumatic.     Nose: Nose normal.     Mouth/Throat:     Mouth: Mucous membranes are moist.  Eyes:     Extraocular Movements: Extraocular movements intact.     Conjunctiva/sclera: Conjunctivae normal.     Pupils: Pupils are equal, round, and reactive to light.  Cardiovascular:     Rate and Rhythm: Normal rate and regular rhythm.     Pulses: Normal pulses.     Heart sounds: Normal heart sounds. No murmur heard. Pulmonary:     Effort: Pulmonary effort is normal. No respiratory distress.     Breath sounds: Normal breath sounds.  Abdominal:     Palpations: Abdomen is soft.     Tenderness: There is no  abdominal tenderness.  Musculoskeletal:        General: No swelling. Normal range of motion.     Cervical back: Normal range of motion and neck supple.  Skin:    General: Skin is warm and dry.     Capillary Refill: Capillary refill takes less than 2 seconds.  Neurological:     General: No focal deficit present.     Mental Status: She is alert and oriented to person, place, and time.     Cranial Nerves: No cranial nerve deficit.     Sensory: No sensory deficit.     Motor: No weakness.     Gait: Gait normal.     Comments: 5+ out of 5 strength throughout, normal sensation, no drift, normal finger-nose-finger, normal speech  Psychiatric:        Mood and Affect: Mood normal.    ED Results / Procedures / Treatments   Labs (all labs ordered are listed, but only abnormal results are displayed) Labs Reviewed - No data to display  EKG None  Radiology CT Head Wo Contrast  Result Date: 08/05/2021 CLINICAL DATA:  Patient with history of headache for 5 days. Elevated blood pressure. EXAM: CT HEAD WITHOUT CONTRAST TECHNIQUE: Contiguous axial images were obtained from the base of the skull through the vertex without intravenous contrast. RADIATION DOSE REDUCTION: This exam was performed according to the departmental dose-optimization program which includes automated exposure control, adjustment of the mA and/or kV according to patient size and/or use of iterative reconstruction technique. COMPARISON:  Brain CT 08/07/2017. FINDINGS: Brain: Ventricles and sulci are prominent compatible with atrophy. Periventricular and subcortical white matter hypodensities compatible with chronic microvascular ischemic changes. No evidence for acute cortically based infarct, intracranial hemorrhage, mass lesion or mass effect. Vascular: Unremarkable Skull: Intact. Sinuses/Orbits: Unremarkable. Other: None. IMPRESSION: No acute intracranial process. Atrophy and chronic microvascular ischemic changes. Electronically  Signed   By: Lovey Newcomer M.D.   On: 08/05/2021 13:17    Procedures Procedures    Medications Ordered in ED Medications  prochlorperazine (COMPAZINE) injection 10 mg (10 mg Intramuscular Given 08/05/21 1333)  diphenhydrAMINE (BENADRYL) capsule 25 mg (25 mg Oral Given 08/05/21 1332)  dexamethasone (DECADRON) injection 10 mg (10 mg Intramuscular Given 08/05/21 1333)    ED Course/ Medical Decision Making/ A&P                           Medical Decision Making  VANYA CARBERRY is here with headache, high blood pressure.  Blood pressure 638 systolic upon arrival.  She has been having sinus type headache for the last 5 days.  No fevers or chills.  Has  had some body aches.  Denies any cough or congestion.  No weakness or numbness.  No chest pain.  Overall patient appears comfortable.  Neurologic exam is normal.  She is not having any chest pain and no stroke symptoms.  Sounds like sinus type headache.  Likely viral process.  Headache cocktail was given with improvement which included Compazine, Benadryl, Decadron.   Differential diagnosis likely sinus/migraine type headache.  I have no concern for stroke, acute coronary syndrome.  CT scan of the head was ordered and interpreted and reviewed by myself that showed no acute findings.  There is no head bleed.  Overall recommend continued use of Tylenol ibuprofen at home.  Blood pressure was improved prior to discharge after her pain was improved.  Overall suspect high blood pressure secondary to headache.  Suspect headache secondary to viral process or migraine type process.  Recommend follow-up with primary care doctor.  Discharged in good condition.  This chart was dictated using voice recognition software.  Despite best efforts to proofread,  errors can occur which can change the documentation meaning.         Final Clinical Impression(s) / ED Diagnoses Final diagnoses:  Acute nonintractable headache, unspecified headache type    Rx / DC  Orders ED Discharge Orders     None         Lennice Sites, DO 08/05/21 1428

## 2021-08-07 ENCOUNTER — Ambulatory Visit (INDEPENDENT_AMBULATORY_CARE_PROVIDER_SITE_OTHER): Payer: Medicare HMO | Admitting: Family Medicine

## 2021-08-07 VITALS — BP 170/108 | HR 75 | Temp 98.0°F | Wt 180.3 lb

## 2021-08-07 DIAGNOSIS — R519 Headache, unspecified: Secondary | ICD-10-CM | POA: Diagnosis not present

## 2021-08-07 DIAGNOSIS — I10 Essential (primary) hypertension: Secondary | ICD-10-CM | POA: Diagnosis not present

## 2021-08-07 MED ORDER — AMLODIPINE BESYLATE 5 MG PO TABS: 5.0000 mg | ORAL_TABLET | Freq: Every day | ORAL | 1 refills | Status: DC

## 2021-08-07 NOTE — Progress Notes (Signed)
Established Patient Office Visit  Subjective:  Patient ID: Rebecca Reid, female    DOB: 03/28/1946  Age: 76 y.o. MRN: [id number]  CC: No chief complaint on file.   HPI LETONYA MANGELS presents for concerns for recent elevated blood pressure and bilateral headaches.  She states that around July she went back to work.  She has had some stress related to that.  Last Thursday she noted onset of headache.  She checked her blood pressure and on Friday had reading of 185/111.  She had multiple elevated readings over the weekend.  She then went to local ER on Monday and had reading they are 167/72.  Her initial reading apparently was 269 systolic there.  She had head CT without contrast which was unremarkable.  Was felt she may have had some acute sinus disease although her CAT scan did not show any evidence for sinusitis.  She also denied any typical sinusitis symptoms.  She has had some recent neck stiffness and soreness and has significant arthritis pains.  She has been taking some Tylenol and occasional ibuprofen for her headaches and this does help somewhat.  Headache slightly improved today.  After ER visit she started taking a full tablet of losartan HCTZ.  Had been taking a half of a 100/25 mg tablet.  No recent dietary changes other than she is eating sometimes on the run.  Perhaps a little more processed foods such as canned soup.  No recent fever.  Past Medical History:  Diagnosis Date   ALLERGIC RHINITIS CAUSE UNSPECIFIED 04/30/2009   Anxiety    DEPRESSION, HX OF 04/11/2010   Dysuria 05/06/2010   Gallstones    GERD 04/30/2009   HYPERLIPIDEMIA 04/30/2009   HYPERTENSION 04/30/2009   LEG EDEMA 12/12/2009   OSTEOPENIA 04/30/2009   PVC (premature ventricular contraction)    Status post dilation of esophageal narrowing     Past Surgical History:  Procedure Laterality Date   ABDOMINAL HYSTERECTOMY  1986   AUGMENTATION MAMMAPLASTY Bilateral    BREAST ENHANCEMENT SURGERY  1987    BREAST SURGERY  1974   biopsy   CARDIAC CATHETERIZATION N/A 11/02/2015   Procedure: Left Heart Cath and Coronary Angiography;  Surgeon: Belva Crome, MD;  Location: [facility];  Service: Cardiovascular;  Laterality: N/A;   Carpel tunel Right    CATARACT EXTRACTION, BILATERAL Bilateral    one in feb and one in March Dr. Tommy Rainwater   CHOLECYSTECTOMY  1974   ectopic pregnancy     OTHER SURGICAL HISTORY     Stomach was not developed at birth    Family History  Problem Relation Age of Onset   Emphysema Mother        heavy smoker   Heart disease Mother 31   Hyperlipidemia Mother    Hypertension Mother    Skin cancer Mother        melanomia, spread to her spine   Arthritis Mother    Heart disease Father 90   Prostate cancer Father    Hyperlipidemia Father    Hypertension Father    Colon cancer Neg Hx    Esophageal cancer Neg Hx    Rectal cancer Neg Hx    Breast cancer Neg Hx     Social History   Socioeconomic History   Marital status: Married    Spouse name: Not on file   Number of children: 3   Years of education: 12    Highest education level: Some college, no  degree  Occupational History   Occupation: retired  Tobacco Use   Smoking status: Never   Smokeless tobacco: Never  Vaping Use   Vaping Use: Never used  Substance and Sexual Activity   Alcohol use: Yes    Comment: social   Drug use: Never   Sexual activity: Not on file  Other Topics Concern   Not on file  Social History Narrative   Married   [place] 2   Retired    Investment banker, operational of Radio broadcast assistant Strain: Low Risk    Difficulty of Paying Living Expenses: Not hard at all  Food Insecurity: No Food Insecurity   Worried About Charity fundraiser in the Last Year: Never true   Arboriculturist in the Last Year: Never true  Transportation Needs: No Transportation Needs   Lack of Transportation (Medical): No   Lack of Transportation (Non-Medical): No  Physical Activity: Insufficiently  Active   Days of Exercise per Week: 3 days   Minutes of Exercise per Session: 30 min  Stress: No Stress Concern Present   Feeling of Stress : Not at all  Social Connections: Moderately Integrated   Frequency of Communication with Friends and Family: More than three times a week   Frequency of Social Gatherings with Friends and Family: Twice a week   Attends Religious Services: More than 4 times per year   Active Member of [employer] or Organizations: No   Attends Music therapist: Never   Marital Status: Married  Human resources officer Violence: Not At Risk   Fear of Current or Ex-Partner: No   Emotionally Abused: No   Physically Abused: No   Sexually Abused: No    Outpatient Medications Prior to Visit  Medication Sig Dispense Refill   azelastine (ASTELIN) 0.1 % nasal spray Place 2 sprays into both nostrils 2 (two) times daily. Use in each nostril as directed 30 mL 3   cetirizine (ZYRTEC) 5 MG tablet Take 5 mg by mouth as needed for allergies.     losartan-hydrochlorothiazide (HYZAAR) 100-25 MG tablet TAKE ONE-HALF TABLET BY MOUTH ONE TIME DAILY 45 tablet 1   meloxicam (MOBIC) 15 MG tablet Take 1 tablet (15 mg total) by mouth daily. 30 tablet 1   metoprolol succinate (TOPROL-XL) 50 MG 24 hr tablet TAKE ONE TABLET BY MOUTH ONE TIME DAILY WITH OR IMMEDIATELY AFTER A MEAL 90 tablet 1   PFIZER-BIONT COVID-19 VAC-TRIS SUSP injection      rosuvastatin (CRESTOR) 10 MG tablet TAKE ONE TABLET BY MOUTH ONE TIME DAILY 90 tablet 0   sertraline (ZOLOFT) 50 MG tablet TAKE ONE TABLET BY MOUTH ONE TIME DAILY 90 tablet 3   diclofenac sodium (VOLTAREN) 1 % GEL Apply 2 g topically 4 (four) times daily. 100 g 1   Ibuprofen-diphenhydrAMINE Cit (ADVIL PM PO) Take 1 tablet by mouth at bedtime.     metroNIDAZOLE (METROGEL) 0.75 % gel Apply 1 application topically 2 (two) times daily. 45 g 2   Molnupiravir 200 MG CAPS Take 4 capsules (800 mg total) by mouth in the morning and at bedtime. 40 capsule 0    omeprazole (PRILOSEC) 40 MG capsule Take 1 capsule (40 mg total) by mouth daily. 90 capsule 3   No facility-administered medications prior to visit.    Allergies  Allergen Reactions   Doxycycline Nausea And Vomiting   Erythromycin Base Other (See Comments)    yeast infection, GI upset   Naproxen Other (See Comments)  GI upset   Prednisone Hives    REACTION: hives, anxiety, insomnia   Scallops [Shellfish Allergy] Nausea And Vomiting    ROS Review of Systems  Constitutional:  Negative for chills and fever.  HENT:  Negative for sinus pressure and sinus pain.   Eyes:  Negative for visual disturbance.  Respiratory:  Negative for cough and shortness of breath.   Cardiovascular:  Negative for chest pain.  Genitourinary:  Negative for dysuria.  Neurological:  Positive for headaches.     Objective:    Physical Exam Vitals reviewed.  Constitutional:      Appearance: Normal appearance.  Neck:     Comments: She does have some paracervical and trapezius muscle tenderness bilaterally Cardiovascular:     Rate and Rhythm: Normal rate and regular rhythm.  Pulmonary:     Effort: Pulmonary effort is normal.     Breath sounds: Normal breath sounds.  Musculoskeletal:     Cervical back: Neck supple.  Neurological:     General: No focal deficit present.     Mental Status: She is alert.     Cranial Nerves: No cranial nerve deficit.    BP 140/80 (BP Location: Right Arm, Patient Position: Sitting, Cuff Size: Normal)    Pulse 75    Temp 98 F (36.7 C) (Oral)    Wt 180 lb 4.8 oz (81.8 kg)    SpO2 96%    BMI 29.10 kg/m  Wt Readings from Last 3 Encounters:  08/07/21 180 lb 4.8 oz (81.8 kg)  08/05/21 182 lb (82.6 kg)  11/28/20 183 lb 12.8 oz (83.4 kg)     Health Maintenance Due  Topic Date Due   COVID-19 Vaccine (5 - Booster for Pfizer series) 01/20/2021    There are no preventive care reminders to display for this patient.  Lab Results  Component Value Date   TSH 4.09  04/13/2020   Lab Results  Component Value Date   WBC 5.8 11/28/2020   HGB 13.3 11/28/2020   HCT 38.9 11/28/2020   MCV 83.1 11/28/2020   PLT 209.0 11/28/2020   Lab Results  Component Value Date   NA 140 11/28/2020   K 3.5 11/28/2020   CO2 29 11/28/2020   GLUCOSE 111 (H) 11/28/2020   BUN 17 11/28/2020   CREATININE 0.87 11/28/2020   BILITOT 0.9 02/04/2021   ALKPHOS 69 02/04/2021   AST 17 02/04/2021   ALT 13 02/04/2021   PROT 6.6 02/04/2021   ALBUMIN 4.4 02/04/2021   CALCIUM 9.4 11/28/2020   GFR 65.92 11/28/2020   Lab Results  Component Value Date   CHOL 147 02/04/2021   Lab Results  Component Value Date   HDL 60.90 02/04/2021   Lab Results  Component Value Date   LDLCALC 66 02/04/2021   Lab Results  Component Value Date   TRIG 103.0 02/04/2021   Lab Results  Component Value Date   CHOLHDL 2 02/04/2021   No results found for: HGBA1C    Assessment & Plan:   #1 hypertension poorly controlled.  We obtain repeat reading after rest left arm 168/108 and right arm 170/108.  -Try to keep daily of sodium intake less than 2500 mg.  Handout on DASH diet given -Continue 1 full tablet of losartan HCTZ. -Add amlodipine 5 mg daily -Avoid regular nonsteroidals much as possible -Set up follow-up in 1 week to reassess  #2 non-intractable bilateral headache.  Doubt related to sinusitis.  May have some cervicogenic component with her chronic neck arthritis.  Suspect this could be related to her blood pressure as well.  -Avoid regular use of non-steroidals -Conservative measures for her neck and upper back pain with moist heat, sports creams, muscle massage -Considers possible short-term use of muscle relaxer at follow-up if not improved   Meds ordered this encounter  Medications   amLODipine (NORVASC) 5 MG tablet    Sig: Take 1 tablet (5 mg total) by mouth daily.    Dispense:  30 tablet    Refill:  1    Follow-up: Return in about 1 week (around 08/14/2021).     Carolann Littler, MD

## 2021-08-07 NOTE — Patient Instructions (Signed)
Set up follow up in one week  Continue with one full tablet of the Losartan  Add Amlodipine 5 mg daily

## 2021-08-14 ENCOUNTER — Ambulatory Visit (INDEPENDENT_AMBULATORY_CARE_PROVIDER_SITE_OTHER): Payer: Medicare HMO | Admitting: Family Medicine

## 2021-08-14 VITALS — BP 140/80 | HR 75 | Temp 98.0°F | Wt 181.3 lb

## 2021-08-14 DIAGNOSIS — I10 Essential (primary) hypertension: Secondary | ICD-10-CM | POA: Diagnosis not present

## 2021-08-14 NOTE — Progress Notes (Signed)
Established Patient Office Visit  Subjective:  Patient ID: Rebecca Reid, female    DOB: March 30, 1946  Age: 76 y.o. MRN: [id number]  CC:  Chief Complaint  Patient presents with   Follow-up    Has home BP readings with her    HPI Rebecca Reid presents for follow-up hypertension.  Refer to note from 1 week ago.  She had been recently to the ER with very high blood pressure reading also confirmed by several home readings that were.  They increased her losartan HCTZ to 1 full tablet daily.  Her blood pressure was still elevated here 170/108 and we added amlodipine 5 mg daily.  She is tolerating well with no side effects.  Her blood pressures have improved substantially.  She brings in a log today and these have been fairly consistently 169I and 503U systolic and 88K to 80K diastolic.  She is trying to watch her salt intake.  She does think the work stress may have been contributing some to her blood pressures.  Her headaches are improving.  She thinks there is some musculoskeletal component.  She took last week out of work and may take off another week.  Past Medical History:  Diagnosis Date   ALLERGIC RHINITIS CAUSE UNSPECIFIED 04/30/2009   Anxiety    DEPRESSION, HX OF 04/11/2010   Dysuria 05/06/2010   Gallstones    GERD 04/30/2009   HYPERLIPIDEMIA 04/30/2009   HYPERTENSION 04/30/2009   LEG EDEMA 12/12/2009   OSTEOPENIA 04/30/2009   PVC (premature ventricular contraction)    Status post dilation of esophageal narrowing     Past Surgical History:  Procedure Laterality Date   ABDOMINAL HYSTERECTOMY  1986   AUGMENTATION MAMMAPLASTY Bilateral    BREAST ENHANCEMENT SURGERY  1987   BREAST SURGERY  1974   biopsy   CARDIAC CATHETERIZATION N/A 11/02/2015   Procedure: Left Heart Cath and Coronary Angiography;  Surgeon: Belva Crome, MD;  Location: [facility];  Service: Cardiovascular;  Laterality: N/A;   Carpel tunel Right    CATARACT EXTRACTION, BILATERAL Bilateral    one  in feb and one in March Dr. Tommy Rainwater   CHOLECYSTECTOMY  1974   ectopic pregnancy     OTHER SURGICAL HISTORY     Stomach was not developed at birth    Family History  Problem Relation Age of Onset   Emphysema Mother        heavy smoker   Heart disease Mother 45   Hyperlipidemia Mother    Hypertension Mother    Skin cancer Mother        melanomia, spread to her spine   Arthritis Mother    Heart disease Father 40   Prostate cancer Father    Hyperlipidemia Father    Hypertension Father    Colon cancer Neg Hx    Esophageal cancer Neg Hx    Rectal cancer Neg Hx    Breast cancer Neg Hx     Social History   Socioeconomic History   Marital status: Married    Spouse name: Not on file   Number of children: 3   Years of education: 12    Highest education level: Some college, no degree  Occupational History   Occupation: retired  Tobacco Use   Smoking status: Never   Smokeless tobacco: Never  Vaping Use   Vaping Use: Never used  Substance and Sexual Activity   Alcohol use: Yes    Comment: social   Drug use: Never  Sexual activity: Not on file  Other Topics Concern   Not on file  Social History Narrative   Married   [place] 2   Retired    Investment banker, operational of Radio broadcast assistant Strain: Low Risk    Difficulty of Paying Living Expenses: Not hard at all  Food Insecurity: No Food Insecurity   Worried About Charity fundraiser in the Last Year: Never true   Arboriculturist in the Last Year: Never true  Transportation Needs: No Transportation Needs   Lack of Transportation (Medical): No   Lack of Transportation (Non-Medical): No  Physical Activity: Sufficiently Active   Days of Exercise per Week: 4 days   Minutes of Exercise per Session: 150+ min  Stress: Stress Concern Present   Feeling of Stress : To some extent  Social Connections: Engineer, building services of Communication with Friends and Family: Once a week   Frequency of Social Gatherings with  Friends and Family: Twice a week   Attends Religious Services: More than 4 times per year   Active Member of [employer] or Organizations: Yes   Attends Music therapist: More than 4 times per year   Marital Status: Married  Human resources officer Violence: Not At Risk   Fear of Current or Ex-Partner: No   Emotionally Abused: No   Physically Abused: No   Sexually Abused: No    Outpatient Medications Prior to Visit  Medication Sig Dispense Refill   amLODipine (NORVASC) 5 MG tablet Take 1 tablet (5 mg total) by mouth daily. 30 tablet 1   azelastine (ASTELIN) 0.1 % nasal spray Place 2 sprays into both nostrils 2 (two) times daily. Use in each nostril as directed 30 mL 3   cetirizine (ZYRTEC) 5 MG tablet Take 5 mg by mouth as needed for allergies.     losartan-hydrochlorothiazide (HYZAAR) 100-25 MG tablet TAKE ONE-HALF TABLET BY MOUTH ONE TIME DAILY 45 tablet 1   meloxicam (MOBIC) 15 MG tablet Take 1 tablet (15 mg total) by mouth daily. 30 tablet 1   metoprolol succinate (TOPROL-XL) 50 MG 24 hr tablet TAKE ONE TABLET BY MOUTH ONE TIME DAILY WITH OR IMMEDIATELY AFTER A MEAL 90 tablet 1   PFIZER-BIONT COVID-19 VAC-TRIS SUSP injection      rosuvastatin (CRESTOR) 10 MG tablet TAKE ONE TABLET BY MOUTH ONE TIME DAILY 90 tablet 0   sertraline (ZOLOFT) 50 MG tablet TAKE ONE TABLET BY MOUTH ONE TIME DAILY 90 tablet 3   No facility-administered medications prior to visit.    Allergies  Allergen Reactions   Doxycycline Nausea And Vomiting   Erythromycin Base Other (See Comments)    yeast infection, GI upset   Naproxen Other (See Comments)    GI upset   Prednisone Hives    REACTION: hives, anxiety, insomnia   Scallops [Shellfish Allergy] Nausea And Vomiting    ROS Review of Systems  Constitutional:  Negative for fatigue.  Eyes:  Negative for visual disturbance.  Respiratory:  Negative for cough, chest tightness, shortness of breath and wheezing.   Cardiovascular:  Negative for chest  pain, palpitations and leg swelling.  Neurological:  Negative for dizziness, seizures, syncope, weakness and light-headedness.     Objective:    Physical Exam Vitals reviewed.  Constitutional:      Appearance: Normal appearance.  Cardiovascular:     Rate and Rhythm: Normal rate and regular rhythm.  Musculoskeletal:     Right lower leg: No edema.  Left lower leg: No edema.  Neurological:     Mental Status: She is alert.    BP 140/80 (BP Location: Left Arm, Patient Position: Sitting, Cuff Size: Normal)    Pulse 75    Temp 98 F (36.7 C) (Oral)    Wt 181 lb 4.8 oz (82.2 kg)    SpO2 99%    BMI 29.26 kg/m  Wt Readings from Last 3 Encounters:  08/14/21 181 lb 4.8 oz (82.2 kg)  08/07/21 180 lb 4.8 oz (81.8 kg)  08/05/21 182 lb (82.6 kg)     Health Maintenance Due  Topic Date Due   COVID-19 Vaccine (5 - Booster for Pfizer series) 01/20/2021    There are no preventive care reminders to display for this patient.  Lab Results  Component Value Date   TSH 4.09 04/13/2020   Lab Results  Component Value Date   WBC 5.8 11/28/2020   HGB 13.3 11/28/2020   HCT 38.9 11/28/2020   MCV 83.1 11/28/2020   PLT 209.0 11/28/2020   Lab Results  Component Value Date   NA 140 11/28/2020   K 3.5 11/28/2020   CO2 29 11/28/2020   GLUCOSE 111 (H) 11/28/2020   BUN 17 11/28/2020   CREATININE 0.87 11/28/2020   BILITOT 0.9 02/04/2021   ALKPHOS 69 02/04/2021   AST 17 02/04/2021   ALT 13 02/04/2021   PROT 6.6 02/04/2021   ALBUMIN 4.4 02/04/2021   CALCIUM 9.4 11/28/2020   GFR 65.92 11/28/2020   Lab Results  Component Value Date   CHOL 147 02/04/2021   Lab Results  Component Value Date   HDL 60.90 02/04/2021   Lab Results  Component Value Date   LDLCALC 66 02/04/2021   Lab Results  Component Value Date   TRIG 103.0 02/04/2021   Lab Results  Component Value Date   CHOLHDL 2 02/04/2021   No results found for: HGBA1C    Assessment & Plan:   Essential hypertension  improved.  Repeat left arm seated after rest 146/78 but she has had multiple home readings that have been consistently better.  -We recommended continued monitoring.  Be in touch if blood pressure consistently greater than 140/90. -Discussed nonpharmacologic management of hypertension. -Consider follow-up in about 3 months to reassess   No orders of the defined types were placed in this encounter.   Follow-up: No follow-ups on file.    Carolann Littler, MD

## 2021-08-14 NOTE — Patient Instructions (Signed)
Continue to monitor blood pressure and be in touch if consistently > 140/90 

## 2021-08-19 ENCOUNTER — Other Ambulatory Visit: Payer: Self-pay

## 2021-08-19 ENCOUNTER — Ambulatory Visit: Payer: Medicare HMO | Admitting: Podiatry

## 2021-08-19 DIAGNOSIS — M898X7 Other specified disorders of bone, ankle and foot: Secondary | ICD-10-CM | POA: Diagnosis not present

## 2021-08-19 DIAGNOSIS — M722 Plantar fascial fibromatosis: Secondary | ICD-10-CM

## 2021-08-19 DIAGNOSIS — L84 Corns and callosities: Secondary | ICD-10-CM | POA: Diagnosis not present

## 2021-08-19 MED ORDER — BETAMETHASONE SOD PHOS & ACET 6 (3-3) MG/ML IJ SUSP
3.0000 mg | Freq: Once | INTRAMUSCULAR | Status: AC
  Administered 2021-08-19: 3 mg via INTRA_ARTICULAR

## 2021-08-19 MED ORDER — DICLOFENAC SODIUM 75 MG PO TBEC: 75.0000 mg | DELAYED_RELEASE_TABLET | Freq: Two times a day (BID) | ORAL | 1 refills | Status: DC

## 2021-08-19 MED ORDER — TRAMADOL HCL 50 MG PO TABS: 50.0000 mg | ORAL_TABLET | Freq: Four times a day (QID) | ORAL | 0 refills | Status: DC | PRN

## 2021-08-19 NOTE — Progress Notes (Signed)
Subjective: 76 y.o. female presenting today for follow-up evaluation of plantar fasciitis to the right foot as well as a symptomatic corn/callus to the left fifth toe.  Patient states that she works on her feet for several hours throughout the day which is very demanding and she has been getting significant foot pain.  She says the injections do help temporarily.  She has tried multiple different shoes and arch supports with no significant improvement. Patient also has a new complaint today regarding a symptomatic hammertoe deformity to the left fifth digit with overlying callus.  She has been shaving the callus down herself.  She would like to have it evaluated and discussed different treatment options  Past Medical History:  Diagnosis Date   ALLERGIC RHINITIS CAUSE UNSPECIFIED 04/30/2009   Anxiety    DEPRESSION, HX OF 04/11/2010   Dysuria 05/06/2010   Gallstones    GERD 04/30/2009   HYPERLIPIDEMIA 04/30/2009   HYPERTENSION 04/30/2009   LEG EDEMA 12/12/2009   OSTEOPENIA 04/30/2009   PVC (premature ventricular contraction)    Status post dilation of esophageal narrowing    Past Surgical History:  Procedure Laterality Date   ABDOMINAL HYSTERECTOMY  1986   AUGMENTATION MAMMAPLASTY Bilateral    BREAST ENHANCEMENT SURGERY  1987   BREAST SURGERY  1974   biopsy   CARDIAC CATHETERIZATION N/A 11/02/2015   Procedure: Left Heart Cath and Coronary Angiography;  Surgeon: Belva Crome, MD;  Location: [facility];  Service: Cardiovascular;  Laterality: N/A;   Carpel tunel Right    CATARACT EXTRACTION, BILATERAL Bilateral    one in feb and one in March Dr. Tommy Rainwater   CHOLECYSTECTOMY  1974   ectopic pregnancy     OTHER SURGICAL HISTORY     Stomach was not developed at birth   Allergies  Allergen Reactions   Doxycycline Nausea And Vomiting   Erythromycin Base Other (See Comments)    yeast infection, GI upset   Naproxen Other (See Comments)    GI upset   Prednisone Hives    REACTION:  hives, anxiety, insomnia   Scallops [Shellfish Allergy] Nausea And Vomiting     Objective: Physical Exam General: The patient is alert and oriented x3 in no acute distress.  Dermatology: Skin is warm, dry and supple bilateral lower extremities. Negative for open lesions or macerations bilateral.  Hyperkeratotic symptomatic corn noted to the dorsal aspect of the left fifth toe overlying the area of the PIPJ  Vascular: Dorsalis Pedis and Posterior Tibial pulses palpable bilateral.  Capillary fill time is immediate to all digits.  Neurological: Epicritic and protective threshold intact bilateral.   Musculoskeletal: There continues to be tenderness to palpation to the plantar aspect of the right heel along the plantar fascia. All other joints range of motion within normal limits bilateral.  Hammertoe contracture deformity noted to the fifth digit of the left foot with hard osseous bone growth around the area of the PIPJ.  Overlying symptomatic corn also noted  Strength 5/5 in all groups bilateral.    Assessment: 1. Plantar fasciitis right 2.  Symptomatic hammertoe with overlying callus/corn fifth digit left 3. H/o arthroplasty fifth digit left 20+ years ago  Plan of Care:  1. Patient evaluated.  2.  Injection of 0.5 cc Celestone Soluspan injected into the right plantar fascia 3.  Prescription for diclofenac 75 mg 2 times daily.  Patient has not had good success with meloxicam 4.  No steroid prescribed.  Patient states she breaks out in hives  and it makes her "crazy" 5.  Prescription for tramadol 50 mg every 6 hours as needed pain 6.  Continue wearing OTC power steps and good supportive shoes.  Patient presented today with all of her shoes and they all seem to have good arch supports to be supportive to the feet 7.  In regards to the hammertoe with overlying corn to the fifth digit of the left foot, recommend wide fitting shoes that do not constrict the toebox area  8.  Recommend OTC corn  and callus remover with routine debridement of the callus using a pumice stone  None.  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 Edrick Kins, DPM    [address], [place] [address]                Office [phone]  Fax [phone]

## 2021-08-22 IMAGING — DX DG HIP (WITH OR WITHOUT PELVIS) 2-3V*L*
2 series · 2 of 2 positions shown · non-contrast
Comparison: CT abdomen pelvis dated 12/19/2013.

CLINICAL DATA: 73-year-old female with left hip pain. Fall.

EXAM:
DG HIP (WITH OR WITHOUT PELVIS) 2-3V LEFT

[pelvis ap]
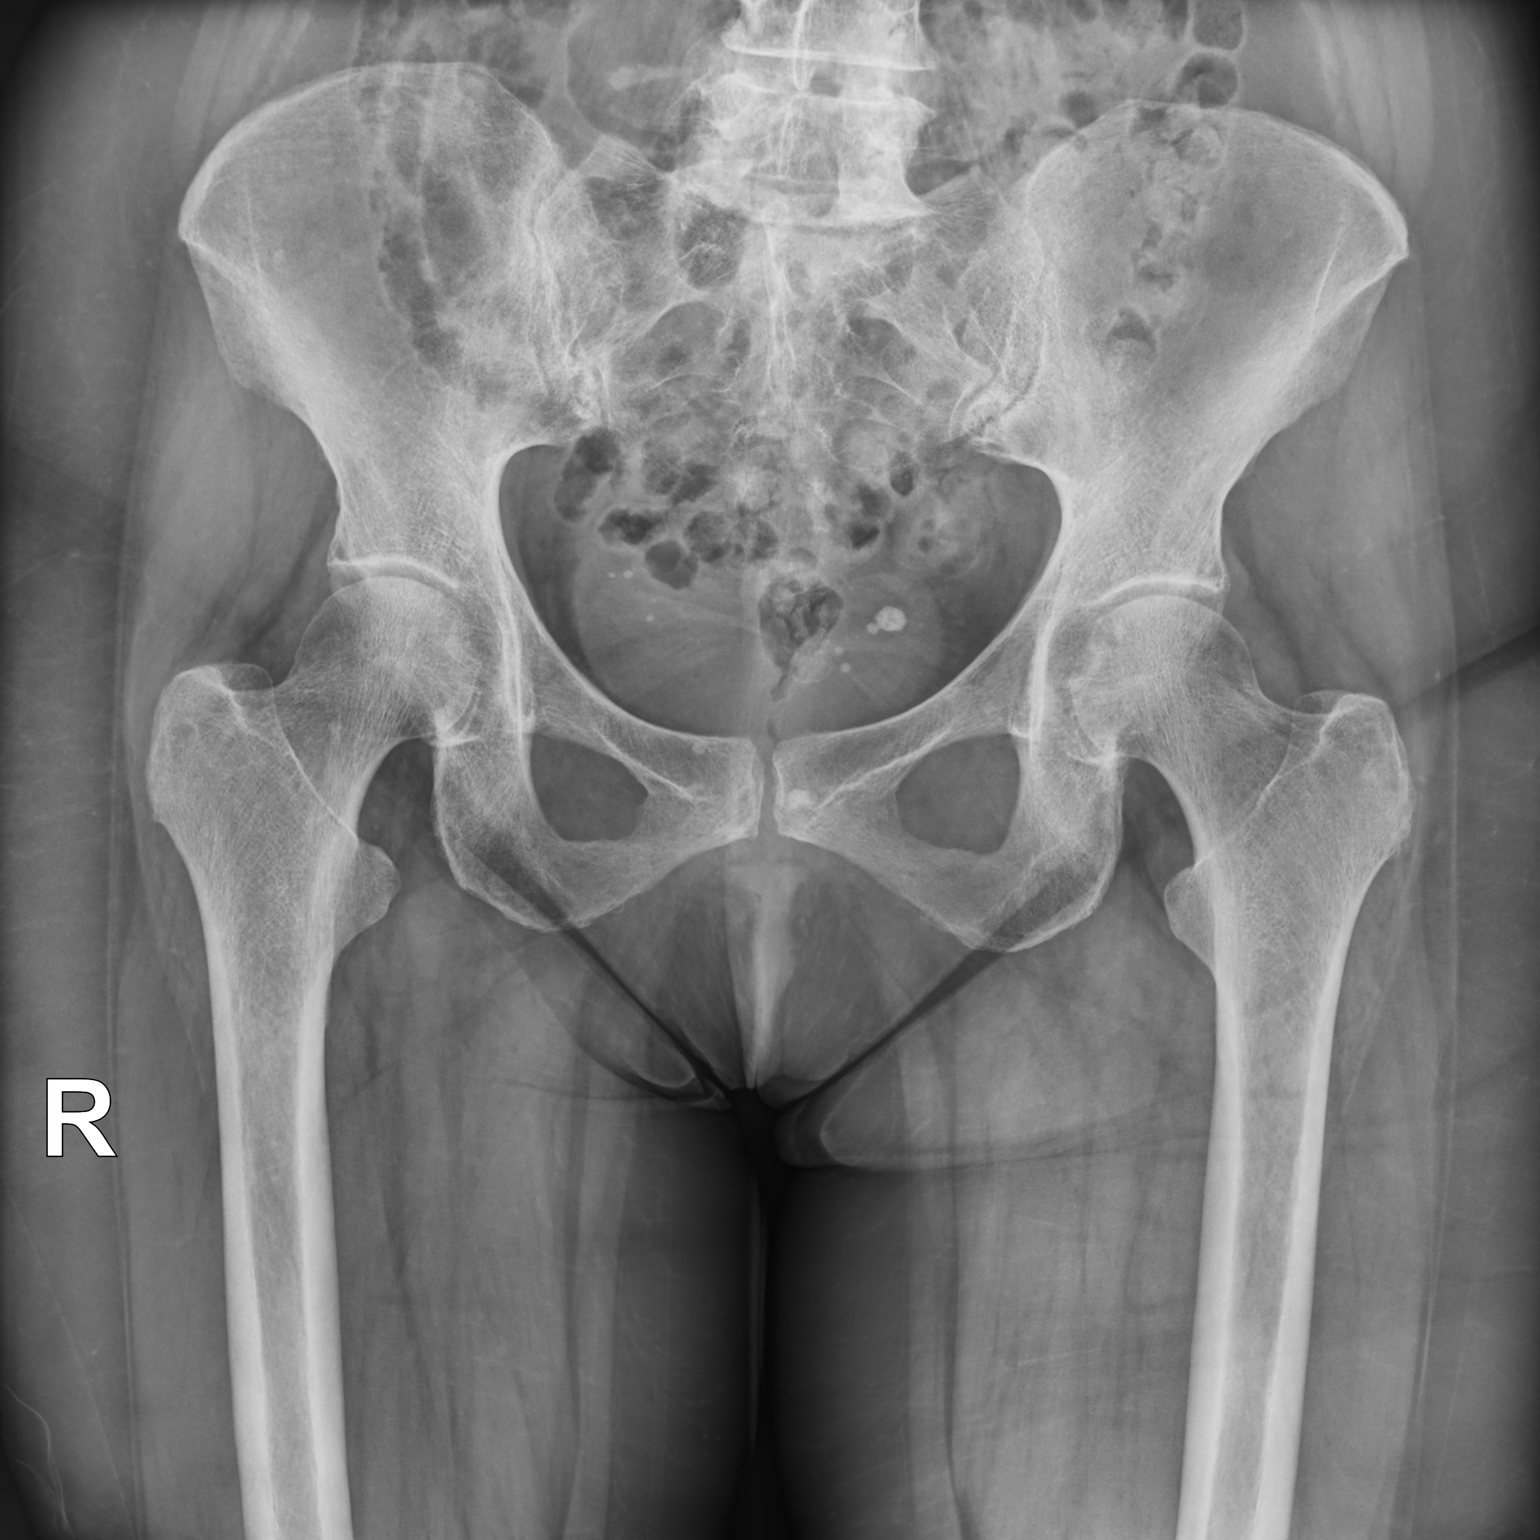

[hip (frog leg) lat]
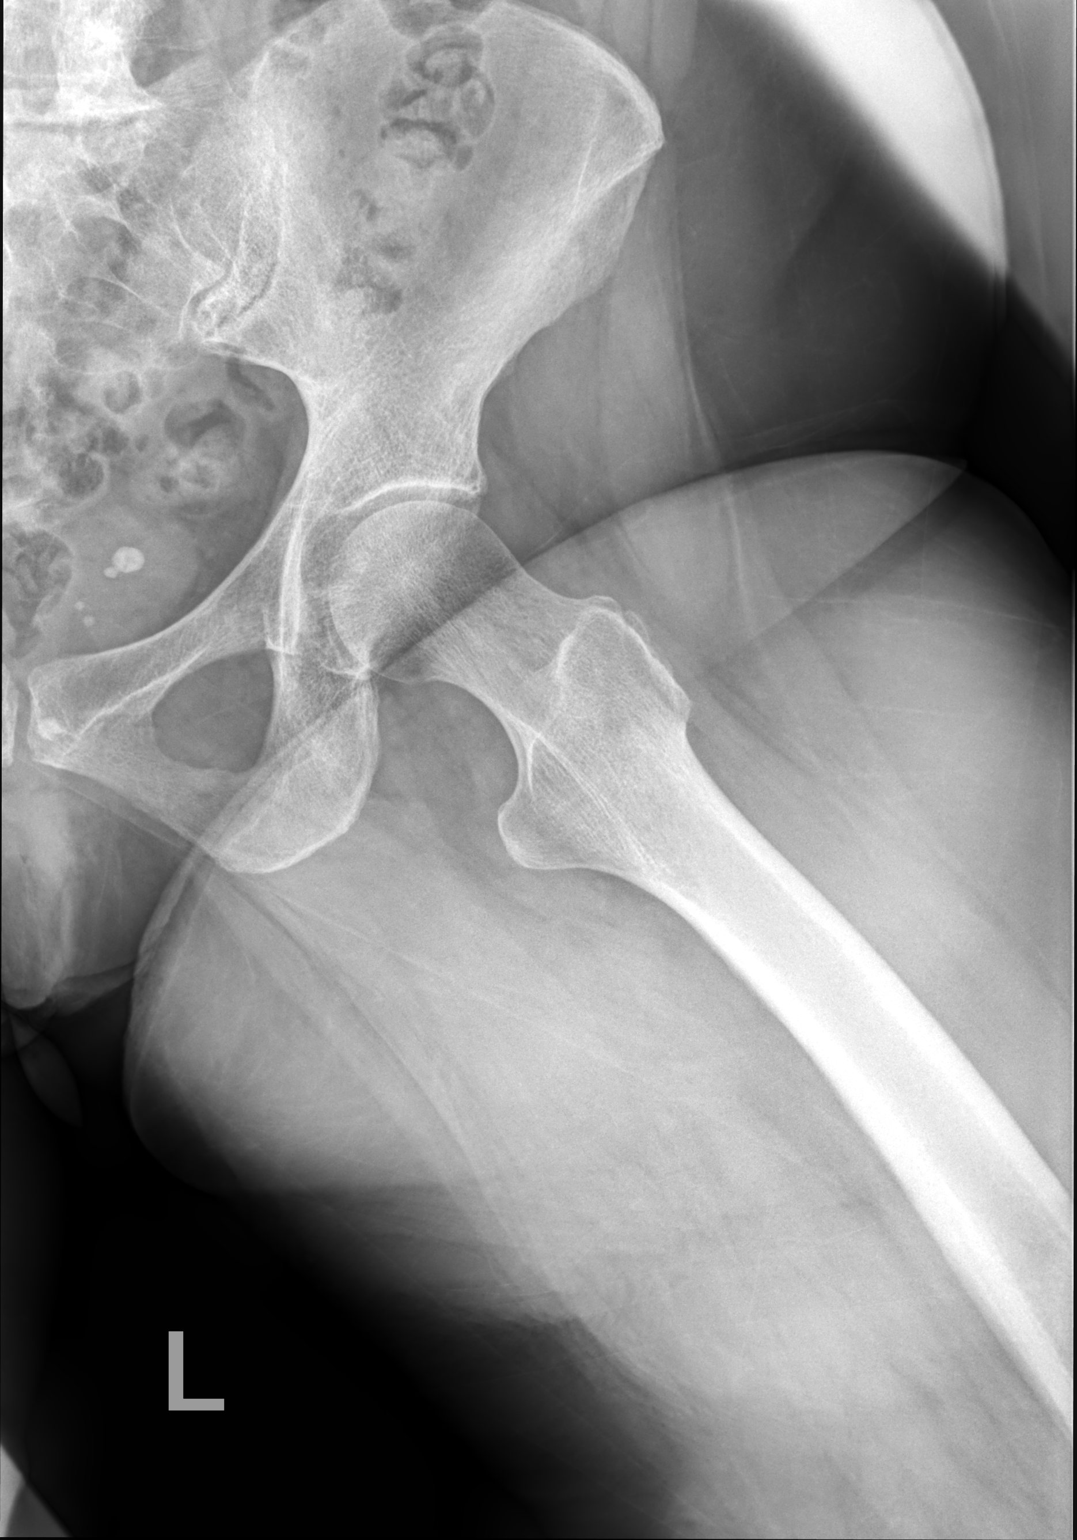

[2 of 2 positions shown; findings below may reference images not displayed]

FINDINGS: There is no acute fracture or dislocation. The bones are mildly
osteopenic. No significant arthritic changes. Pelvic phleboliths
noted. The soft tissues are unremarkable.
IMPRESSION: No acute fracture or dislocation.

## 2021-08-22 IMAGING — DX DG SHOULDER 2+V*L*
3 series · 3 of 3 positions shown · non-contrast
Comparison: None.

CLINICAL DATA: Fell 1 week ago, left shoulder pain

EXAM:
LEFT SHOULDER - 2+ VIEW

[shoulder internal rotation ap]
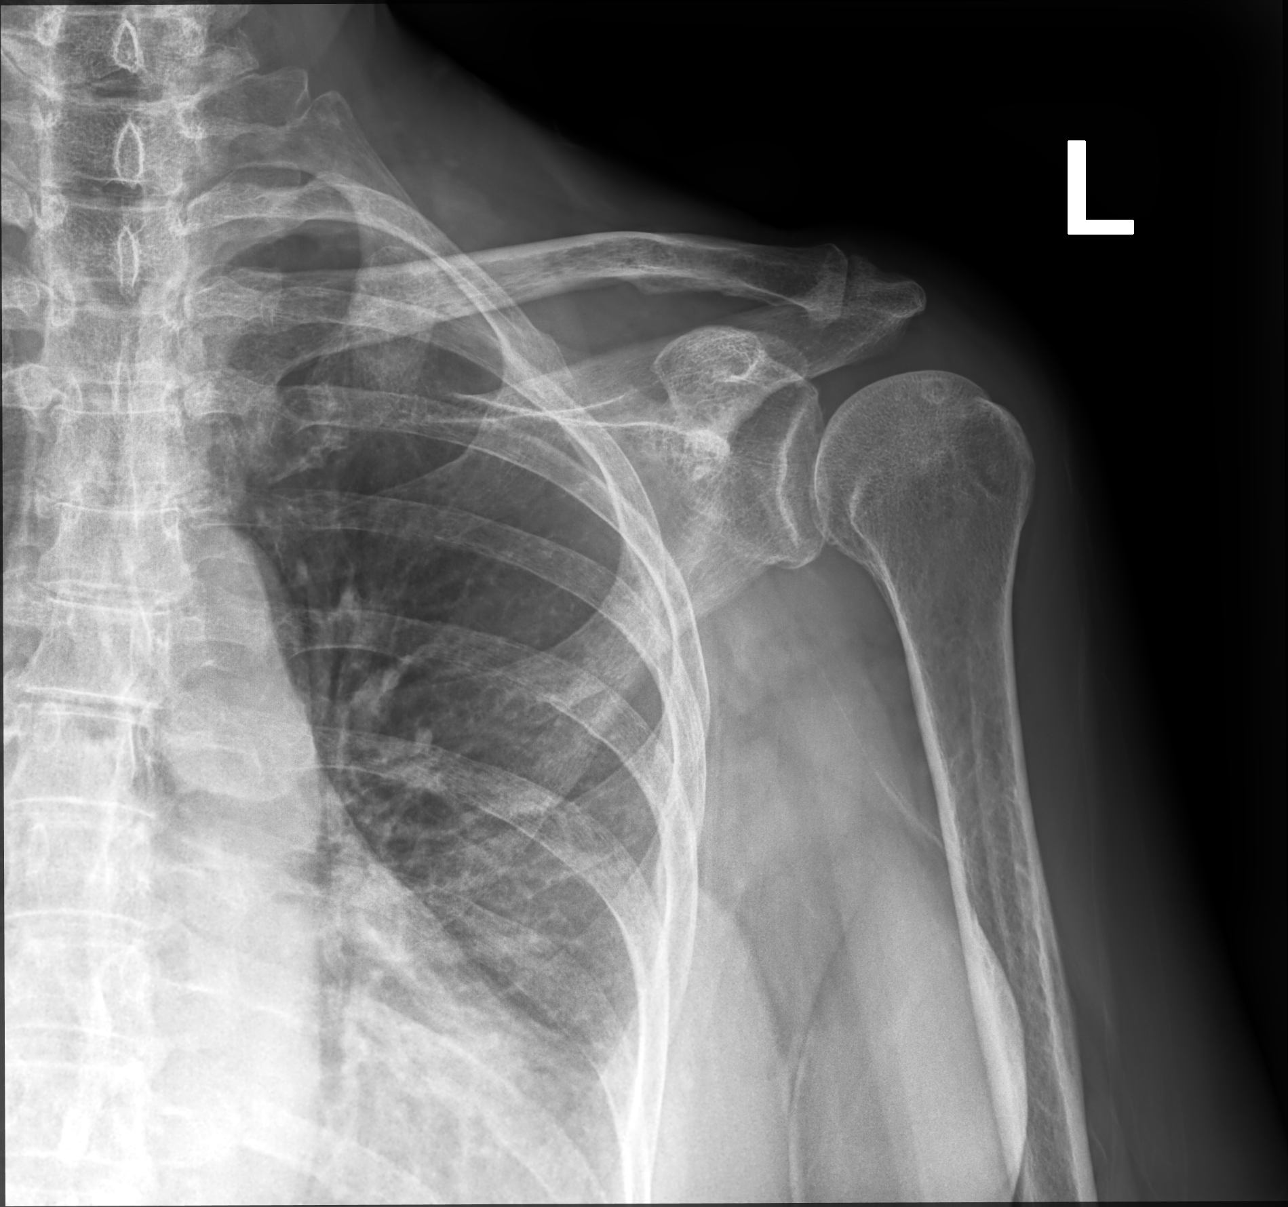

[shoulder external rotation ap]
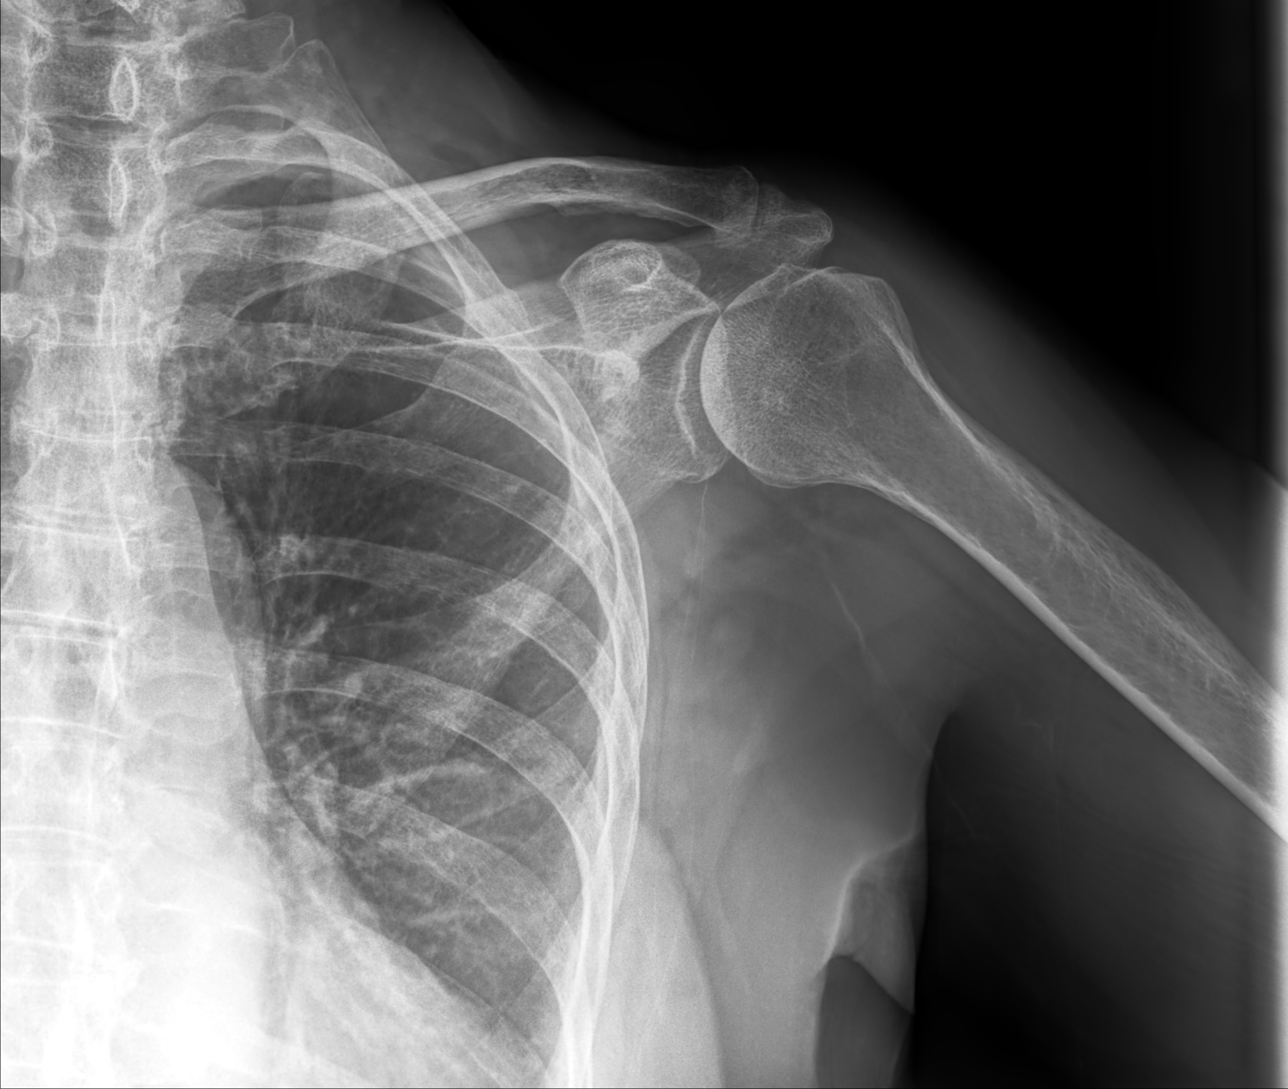

[shoulder (y view)]
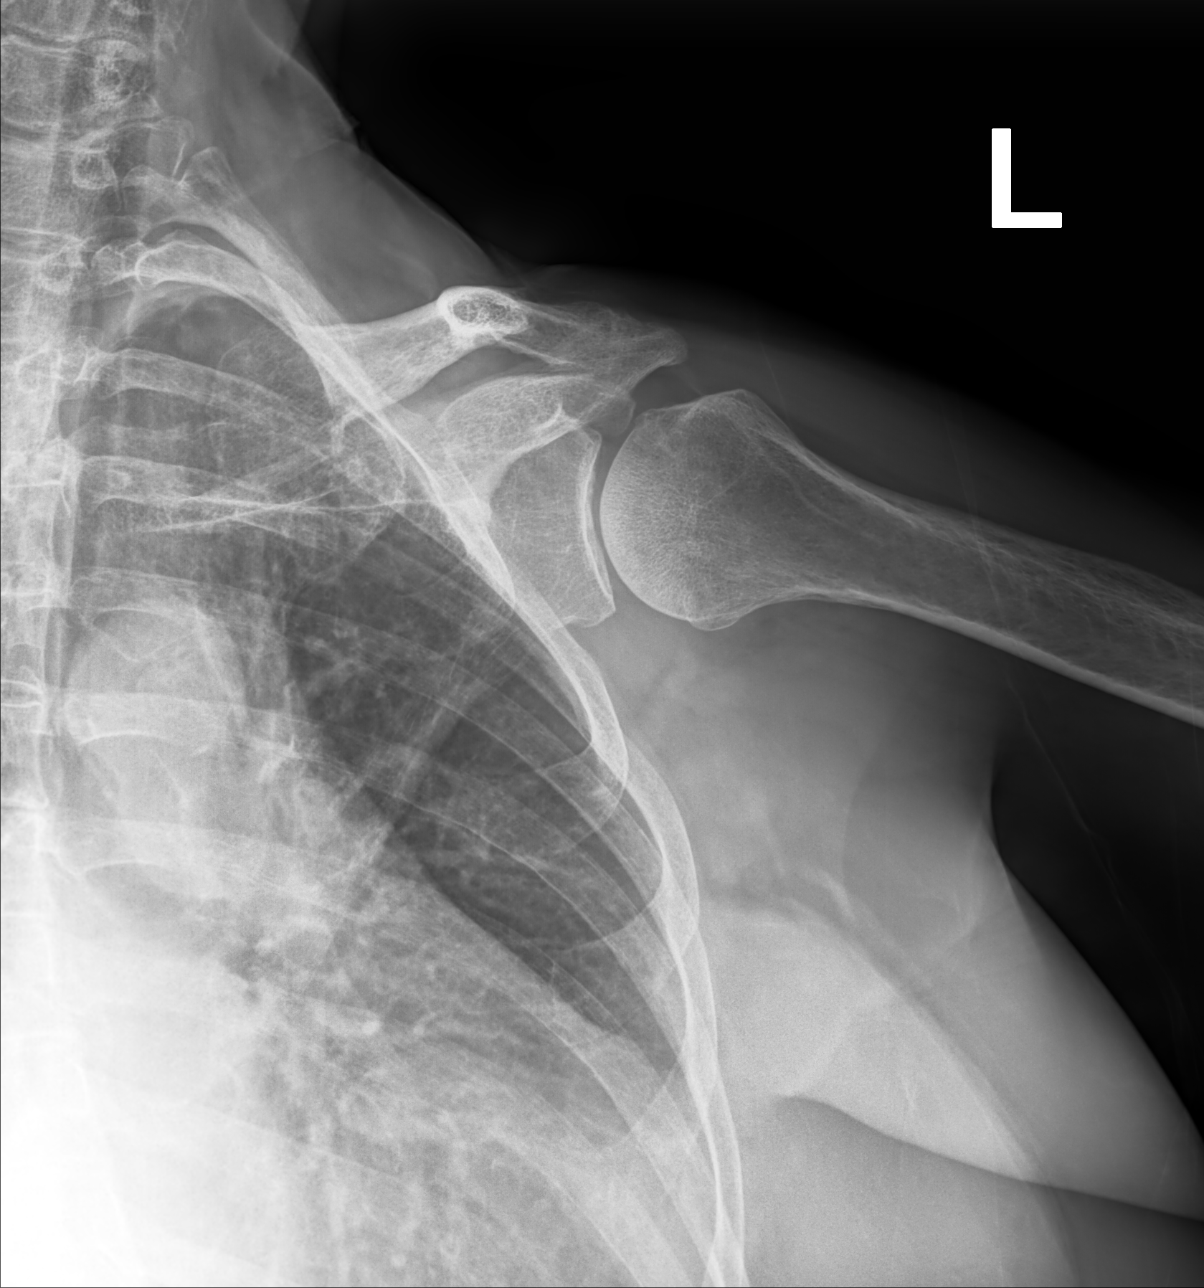

[3 of 3 positions shown; findings below may reference images not displayed]

FINDINGS: Internal rotation, external rotation, and axillary views of the left
shoulder are obtained. There is moderate glenohumeral and
acromioclavicular joint osteoarthritis with joint space narrowing
and osteophyte formation. No fracture, subluxation, or dislocation.
Left chest is clear.
IMPRESSION: 1. Osteoarthritis, no acute fracture.

## 2021-09-23 ENCOUNTER — Ambulatory Visit (INDEPENDENT_AMBULATORY_CARE_PROVIDER_SITE_OTHER): Payer: Medicare HMO

## 2021-09-23 ENCOUNTER — Other Ambulatory Visit: Payer: Self-pay | Admitting: Family Medicine

## 2021-09-23 VITALS — BP 122/73 | Ht 66.0 in | Wt 177.0 lb

## 2021-09-23 DIAGNOSIS — Z0000 Encounter for general adult medical examination without abnormal findings: Secondary | ICD-10-CM

## 2021-09-23 NOTE — Progress Notes (Signed)
Subjective:   Rebecca Reid is a 76 y.o. female who presents for Medicare Annual (Subsequent) preventive examination.  Review of Systems    Virtual Visit via Telephone Note  I connected with  Rebecca BRENTS on 09/23/21 at  3:30 PM EST by telephone and verified that I am speaking with the correct person using two identifiers.  Location: Patient: Home  Provider: Office Persons participating in the virtual visit: patient/Nurse Health Advisor   I discussed the limitations, risks, security and privacy concerns of performing an evaluation and management service by telephone and the availability of in person appointments. The patient expressed understanding and agreed to proceed.  Interactive audio and video telecommunications were attempted between this nurse and patient, however failed, due to patient having technical difficulties OR patient did not have access to video capability.  We continued and completed visit with audio only.  Some vital signs may be absent or patient reported.   Criselda Peaches, LPN  Cardiac Risk Factors include: advanced age (>38mn, >>80women);hypertension     Objective:    Today's Vitals   09/23/21 1530  BP: 122/73  Weight: 177 lb (80.3 kg)  Height: '5\' 6"'$  (1.676 m)   Body mass index is 28.57 kg/m.  Advanced Directives 09/23/2021 08/05/2021 10/16/2020 08/15/2019 04/19/2018 04/15/2018 11/02/2015  Does Patient Have a Medical Advance Directive? Yes Yes Yes Yes Yes Yes Yes  Type of AParamed[place]of [place]orest [place] Village[place] will - - [place] - [place] Living will;Healthcare Power of Attorney  Does patient want to make changes to medical advance directive? No - Patient declined - No - Patient declined No - Patient declined - - No - Patient declined  Copy of HY[place] Chart? No - copy requested - - No - copy requested - No - copy requested No - copy requested     Current Medications (verified) Outpatient Encounter Medications as of 09/23/2021  Medication Sig   amLODipine (NORVASC) 5 MG tablet TAKE ONE TABLET BY MOUTH ONE TIME DAILY   azelastine (ASTELIN) 0.1 % nasal spray Place 2 sprays into both nostrils 2 (two) times daily. Use in each nostril as directed   cetirizine (ZYRTEC) 5 MG tablet Take 5 mg by mouth as needed for allergies.   diclofenac (VOLTAREN) 75 MG EC tablet Take 1 tablet (75 mg total) by mouth 2 (two) times daily.   losartan-hydrochlorothiazide (HYZAAR) 100-25 MG tablet TAKE ONE-HALF TABLET BY MOUTH ONE TIME DAILY   meloxicam (MOBIC) 15 MG tablet Take 1 tablet (15 mg total) by mouth daily.   metoprolol succinate (TOPROL-XL) 50 MG 24 hr tablet TAKE ONE TABLET BY MOUTH ONE TIME DAILY WITH OR IMMEDIATELY AFTER A MEAL   PFIZER-BIONT COVID-19 VAC-TRIS SUSP injection    rosuvastatin (CRESTOR) 10 MG tablet TAKE ONE TABLET BY MOUTH ONE TIME DAILY   sertraline (ZOLOFT) 50 MG tablet TAKE ONE TABLET BY MOUTH ONE TIME DAILY   [DISCONTINUED] amLODipine (NORVASC) 5 MG tablet Take 1 tablet (5 mg total) by mouth daily.   No facility-administered encounter medications on file as of 09/23/2021.    Allergies (verified) Doxycycline, Erythromycin base, Naproxen, Prednisone, and Scallops [shellfish allergy]   History: Past Medical History:  Diagnosis Date   ALLERGIC RHINITIS CAUSE UNSPECIFIED 04/30/2009   Anxiety    DEPRESSION, HX OF 04/11/2010   Dysuria 05/06/2010   Gallstones    GERD 04/30/2009   HYPERLIPIDEMIA 04/30/2009   HYPERTENSION 04/30/2009  LEG EDEMA 12/12/2009   OSTEOPENIA 04/30/2009   PVC (premature ventricular contraction)    Status post dilation of esophageal narrowing    Past Surgical History:  Procedure Laterality Date   ABDOMINAL HYSTERECTOMY  1986   AUGMENTATION MAMMAPLASTY Bilateral    BREAST ENHANCEMENT SURGERY  1987   BREAST SURGERY  1974   biopsy   CARDIAC CATHETERIZATION N/A 11/02/2015   Procedure: Left Heart Cath  and Coronary Angiography;  Surgeon: Belva Crome, MD;  Location: [facility];  Service: Cardiovascular;  Laterality: N/A;   Carpel tunel Right    CATARACT EXTRACTION, BILATERAL Bilateral    one in feb and one in March Dr. Tommy Rainwater   CHOLECYSTECTOMY  1974   ectopic pregnancy     OTHER SURGICAL HISTORY     Stomach was not developed at birth   Family History  Problem Relation Age of Onset   Emphysema Mother        heavy smoker   Heart disease Mother 79   Hyperlipidemia Mother    Hypertension Mother    Skin cancer Mother        melanomia, spread to her spine   Arthritis Mother    Heart disease Father 18   Prostate cancer Father    Hyperlipidemia Father    Hypertension Father    Colon cancer Neg Hx    Esophageal cancer Neg Hx    Rectal cancer Neg Hx    Breast cancer Neg Hx    Social History   Socioeconomic History   Marital status: Married    Spouse name: Not on file   Number of children: 3   Years of education: 12    Highest education level: Some college, no degree  Occupational History   Occupation: retired  Tobacco Use   Smoking status: Never   Smokeless tobacco: Never  Scientific laboratory technician Use: Never used  Substance and Sexual Activity   Alcohol use: Yes    Comment: social   Drug use: Never   Sexual activity: Not on file  Other Topics Concern   Not on file  Social History Narrative   Married   [place] 2   Retired    Investment banker, operational of Radio broadcast assistant Strain: Low Risk    Difficulty of Paying Living Expenses: Not hard at all  Food Insecurity: No Food Insecurity   Worried About Charity fundraiser in the Last Year: Never true   Arboriculturist in the Last Year: Never true  Transportation Needs: No Transportation Needs   Lack of Transportation (Medical): No   Lack of Transportation (Non-Medical): No  Physical Activity: Sufficiently Active   Days of Exercise per Week: 6 days   Minutes of Exercise per Session: 120 min  Stress: No Stress  Concern Present   Feeling of Stress : Not at all  Social Connections: Socially Integrated   Frequency of Communication with Friends and Family: More than three times a week   Frequency of Social Gatherings with Friends and Family: Once a week   Attends Religious Services: More than 4 times per year   Active Member of [employer] or Organizations: Yes   Attends Archivist Meetings: Never   Marital Status: Married    Tobacco Counseling Counseling given: Not Answered   Clinical Intake:  Pre-visit preparation completed: Yes  Pain : No/denies pain     BMI - recorded: 29.28 Nutritional Status: BMI 25 -29 Overweight Nutritional Risks: None  Diabetes: No  How often do you need to have someone help you when you read instructions, pamphlets, or other written materials from your doctor or pharmacy?: 1 - Never  Diabetic?  No   Activities of Daily Living In your present state of health, do you have any difficulty performing the following activities: 09/23/2021 09/22/2021  Hearing? N N  Vision? N N  Difficulty concentrating or making decisions? N N  Walking or climbing stairs? N N  Dressing or bathing? N N  Doing errands, shopping? N N  Preparing Food and eating ? N N  Using the Toilet? N N  In the past six months, have you accidently leaked urine? N N  Do you have problems with loss of bowel control? N N  Managing your Medications? N N  Managing your Finances? N N  Housekeeping or managing your Housekeeping? - N  Some recent data might be hidden    Patient Care Team: [name], MD as PCP - General Viona Gilmore, [facility] as Pharmacist (Pharmacist)  Indicate any recent Medical Services you may have received from other than [facility] providers in the past year (date may be approximate).     Assessment:   This is a routine wellness examination for Milda.  Hearing/Vision screen Hearing Screening - Comments:: No hearing difficulty Vision Screening - Comments:: Wears  reading glasses. Followed by [place] issues and exercise activities discussed: Exercise limited by: None identified   Goals Addressed             This Visit's Progress    DIET - INCREASE LEAN PROTEINS       Low cholesterol.        Depression Screen PHQ 2/9 Scores 09/23/2021 08/07/2021 10/16/2020 08/15/2019 04/19/2018 04/06/2018 03/25/2016  PHQ - 2 Score 0 0 0 0 0 0 0  PHQ- 9 Score - - - - - 1 -    Fall Risk Fall Risk  09/23/2021 09/22/2021 08/09/2021 08/07/2021 10/16/2020  Falls in the past year? 0 '1 1 1 '$ 0  Number falls in past yr: 0 0 1 0 0  Injury with Fall? 0 0 1 1 0  Comment - - - - -  Risk for fall due to : No Fall Risks - - - -  Follow up - - - - Falls evaluation completed    FALL RISK PREVENTION PERTAINING TO THE HOME:  Any stairs in or around the home? No  If so, are there any without handrails? No  Home free of loose throw rugs in walkways, pet beds, electrical cords, etc? Yes  Adequate lighting in your home to reduce risk of falls? Yes   ASSISTIVE DEVICES UTILIZED TO PREVENT FALLS:  Life alert? No  Use of a cane, walker or w/c? No Grab bars in the bathroom? Yes Shower chair or bench in shower? Yes Elevated toilet seat or a handicapped toilet? No   TIMED UP AND GO:  Was the test performed? No . Audio Visit  Cognitive Function: MMSE - Mini Mental State Exam 04/19/2018  Not completed: (No Data)     6CIT Screen 09/23/2021 08/15/2019  What Year? 0 points 0 points  What month? 0 points 0 points  What time? 0 points 0 points  Count back from 20 0 points 0 points  Months in reverse 0 points 0 points  Repeat phrase 0 points 0 points  Total Score 0 0    Immunizations Immunization History  Administered Date(s) Administered  Fluad Quad(high Dose 65+) 03/29/2019, 04/02/2020, 04/22/2021   Influenza Split 05/16/2011, 03/31/2012   Influenza Whole 04/11/2010   Influenza, High Dose Seasonal PF 03/25/2016, 03/24/2017, 04/06/2018   Influenza,inj,Quad  PF,6+ Mos 03/31/2013, 04/16/2015   Influenza-Unspecified 05/04/2014   PFIZER(Purple Top)SARS-COV-2 Vaccination 08/28/2019, 09/21/2019, 04/20/2020, 11/25/2020   Pneumococcal Conjugate-13 03/31/2012, 03/25/2016   Pneumococcal Polysaccharide-23 04/06/2018   Td 04/20/1989, 04/30/2009   Tdap 03/31/2019   Zoster Recombinat (Shingrix) 08/10/2018, 02/07/2019   Zoster, Live 11/04/2012    TDAP status: Up to date  Flu Vaccine status: Up to date  Pneumococcal vaccine status: Up to date  Covid-19 vaccine status: Completed vaccines  Qualifies for Shingles Vaccine? Yes   Zostavax completed Yes   Shingrix Completed?: Yes  Screening Tests Health Maintenance  Topic Date Due   COVID-19 Vaccine (5 - Booster for Pfizer series) 01/20/2021   Fecal DNA (Cologuard)  06/27/2022   MAMMOGRAM  12/26/2022   TETANUS/TDAP  03/30/2029   Pneumonia Vaccine 19+ Years old  Completed   INFLUENZA VACCINE  Completed   DEXA SCAN  Completed   Hepatitis C Screening  Completed   Zoster Vaccines- Shingrix  Completed   HPV VACCINES  Aged Out    Health Maintenance  Health Maintenance Due  Topic Date Due   COVID-19 Vaccine (5 - Booster for [place] series) 01/20/2021    Colorectal cancer screening: Type of screening: Cologuard. Completed 06/28/19. Repeat every 3 years  Mammogram status: Completed 12/25/20. Repeat every year   Lung Cancer Screening: (Low Dose CT Chest recommended if Age 16-80 years, 30 pack-year currently smoking OR have quit w/in 15years.) does not qualify.    Additional Screening:  Hepatitis C Screening: does qualify; Completed 04/06/18  Vision Screening: Recommended annual ophthalmology exams for early detection of glaucoma and other disorders of the eye. Is the patient up to date with their annual eye exam?  Yes  Who is the provider or what is the name of the office in which the patient attends annual eye exams? [place] [facility] If pt is not established with a provider, would they like to be  referred to a provider to establish care? No .   Dental Screening: Recommended annual dental exams for proper oral hygiene  Community Resource Referral / Chronic Care Management:  CRR required this visit?  No   CCM required this visit?  No      Plan:     I have personally reviewed and noted the following in the patients chart:   Medical and social history Use of alcohol, tobacco or illicit drugs  Current medications and supplements including opioid prescriptions.  Functional ability and status Nutritional status Physical activity Advanced directives List of other physicians Hospitalizations, surgeries, and ER visits in previous 12 months Vitals Screenings to include cognitive, depression, and falls Referrals and appointments  In addition, I have reviewed and discussed with patient certain preventive protocols, quality metrics, and best practice recommendations. A written personalized care plan for preventive services as well as general preventive health recommendations were provided to patient.     Criselda Peaches, LPN   08/30/5619   Nurse Notes: None

## 2021-09-23 NOTE — Patient Instructions (Addendum)
Rebecca Reid , Thank you for taking time to come for your Medicare Wellness Visit. I appreciate your ongoing commitment to your health goals. Please review the following plan we discussed and let me know if I can assist you in the future.   These are the goals we discussed:  Goals      Avoid Covid     DIET - INCREASE LEAN PROTEINS     Low cholesterol.      Exercise 150 min/wk Moderate Activity        This is a list of the screening recommended for you and due dates:  Health Maintenance  Topic Date Due   COVID-19 Vaccine (5 - Booster for Pfizer series) 01/20/2021   Cologuard (Stool DNA test)  06/27/2022   Mammogram  12/26/2022   Tetanus Vaccine  03/30/2029   Pneumonia Vaccine  Completed   Flu Shot  Completed   DEXA scan (bone density measurement)  Completed   Hepatitis C Screening: USPSTF Recommendation to screen - Ages 69-79 yo.  Completed   Zoster (Shingles) Vaccine  Completed   HPV Vaccine  Aged Out     Advanced directives: Yes Patient will bring copy  Conditions/risks identified: None  Next appointment: Follow up in one year for your annual wellness visit    Preventive Care 65 Years and Older, Female Preventive care refers to lifestyle choices and visits with your health care provider that can promote health and wellness. What does preventive care include? A yearly physical exam. This is also called an annual well check. Dental exams once or twice a year. Routine eye exams. Ask your health care provider how often you should have your eyes checked. Personal lifestyle choices, including: Daily care of your teeth and gums. Regular physical activity. Eating a healthy diet. Avoiding tobacco and drug use. Limiting alcohol use. Practicing safe sex. Taking low-dose aspirin every day. Taking vitamin and mineral supplements as recommended by your health care provider. What happens during an annual well check? The services and screenings done by your health care provider  during your annual well check will depend on your age, overall health, lifestyle risk factors, and family history of disease. Counseling  Your health care provider may ask you questions about your: Alcohol use. Tobacco use. Drug use. Emotional well-being. Home and relationship well-being. Sexual activity. Eating habits. History of falls. Memory and ability to understand (cognition). Work and work Statistician. Reproductive health. Screening  You may have the following tests or measurements: Height, weight, and BMI. Blood pressure. Lipid and cholesterol levels. These may be checked every 5 years, or more frequently if you are over 34 years old. Skin check. Lung cancer screening. You may have this screening every year starting at age 86 if you have a 30-pack-year history of smoking and currently smoke or have quit within the past 15 years. Fecal occult blood test (FOBT) of the stool. You may have this test every year starting at age 34. Flexible sigmoidoscopy or colonoscopy. You may have a sigmoidoscopy every 5 years or a colonoscopy every 10 years starting at age 59. Hepatitis C blood test. Hepatitis B blood test. Sexually transmitted disease (STD) testing. Diabetes screening. This is done by checking your blood sugar (glucose) after you have not eaten for a while (fasting). You may have this done every 1-3 years. Bone density scan. This is done to screen for osteoporosis. You may have this done starting at age 39. Mammogram. This may be done every 1-2 years. Talk to your  health care provider about how often you should have regular mammograms. Talk with your health care provider about your test results, treatment options, and if necessary, the need for more tests. Vaccines  Your health care provider may recommend certain vaccines, such as: Influenza vaccine. This is recommended every year. Tetanus, diphtheria, and acellular pertussis (Tdap, Td) vaccine. You may need a Td booster every  10 years. Zoster vaccine. You may need this after age 64. Pneumococcal 13-valent conjugate (PCV13) vaccine. One dose is recommended after age 4. Pneumococcal polysaccharide (PPSV23) vaccine. One dose is recommended after age 57. Talk to your health care provider about which screenings and vaccines you need and how often you need them. This information is not intended to replace advice given to you by your health care provider. Make sure you discuss any questions you have with your health care provider. Document Released: 08/03/2015 Document Revised: 03/26/2016 Document Reviewed: 05/08/2015 Elsevier Interactive Patient Education  2017 [place] Prevention in the Home Falls can cause injuries. They can happen to people of all ages. There are many things you can do to make your home safe and to help prevent falls. What can I do on the outside of my home? Regularly fix the edges of walkways and driveways and fix any cracks. Remove anything that might make you trip as you walk through a door, such as a raised step or threshold. Trim any bushes or trees on the path to your home. Use bright outdoor lighting. Clear any walking paths of anything that might make someone trip, such as rocks or tools. Regularly check to see if handrails are loose or broken. Make sure that both sides of any steps have handrails. Any raised decks and porches should have guardrails on the edges. Have any leaves, snow, or ice cleared regularly. Use sand or salt on walking paths during winter. Clean up any spills in your garage right away. This includes oil or grease spills. What can I do in the bathroom? Use night lights. Install grab bars by the toilet and in the tub and shower. Do not use towel bars as grab bars. Use non-skid mats or decals in the tub or shower. If you need to sit down in the shower, use a plastic, non-slip stool. Keep the floor dry. Clean up any water that spills on the floor as soon as it  happens. Remove soap buildup in the tub or shower regularly. Attach bath mats securely with double-sided non-slip rug tape. Do not have throw rugs and other things on the floor that can make you trip. What can I do in the bedroom? Use night lights. Make sure that you have a light by your bed that is easy to reach. Do not use any sheets or blankets that are too big for your bed. They should not hang down onto the floor. Have a firm chair that has side arms. You can use this for support while you get dressed. Do not have throw rugs and other things on the floor that can make you trip. What can I do in the kitchen? Clean up any spills right away. Avoid walking on wet floors. Keep items that you use a lot in easy-to-reach places. If you need to reach something above you, use a strong step stool that has a grab bar. Keep electrical cords out of the way. Do not use floor polish or wax that makes floors slippery. If you must use wax, use non-skid floor wax. Do not have  throw rugs and other things on the floor that can make you trip. What can I do with my stairs? Do not leave any items on the stairs. Make sure that there are handrails on both sides of the stairs and use them. Fix handrails that are broken or loose. Make sure that handrails are as long as the stairways. Check any carpeting to make sure that it is firmly attached to the stairs. Fix any carpet that is loose or worn. Avoid having throw rugs at the top or bottom of the stairs. If you do have throw rugs, attach them to the floor with carpet tape. Make sure that you have a light switch at the top of the stairs and the bottom of the stairs. If you do not have them, ask someone to add them for you. What else can I do to help prevent falls? Wear shoes that: Do not have high heels. Have rubber bottoms. Are comfortable and fit you well. Are closed at the toe. Do not wear sandals. If you use a stepladder: Make sure that it is fully opened.  Do not climb a closed stepladder. Make sure that both sides of the stepladder are locked into place. Ask someone to hold it for you, if possible. Clearly mark and make sure that you can see: Any grab bars or handrails. First and last steps. Where the edge of each step is. Use tools that help you move around (mobility aids) if they are needed. These include: Canes. Walkers. Scooters. Crutches. Turn on the lights when you go into a dark area. Replace any light bulbs as soon as they burn out. Set up your furniture so you have a clear path. Avoid moving your furniture around. If any of your floors are uneven, fix them. If there are any pets around you, be aware of where they are. Review your medicines with your doctor. Some medicines can make you feel dizzy. This can increase your chance of falling. Ask your doctor what other things that you can do to help prevent falls. This information is not intended to replace advice given to you by your health care provider. Make sure you discuss any questions you have with your health care provider. Document Released: 05/03/2009 Document Revised: 12/13/2015 Document Reviewed: 08/11/2014 Elsevier Interactive Patient Education  2017 [employer].

## 2021-09-27 IMAGING — DX DG HAND COMPLETE 3+V*R*
3 series · 3 of 3 positions shown · non-contrast
Comparison: No prior.

CLINICAL DATA: Right hand pain after fall.

EXAM:
RIGHT HAND - COMPLETE 3+ VIEW

[hand pa]
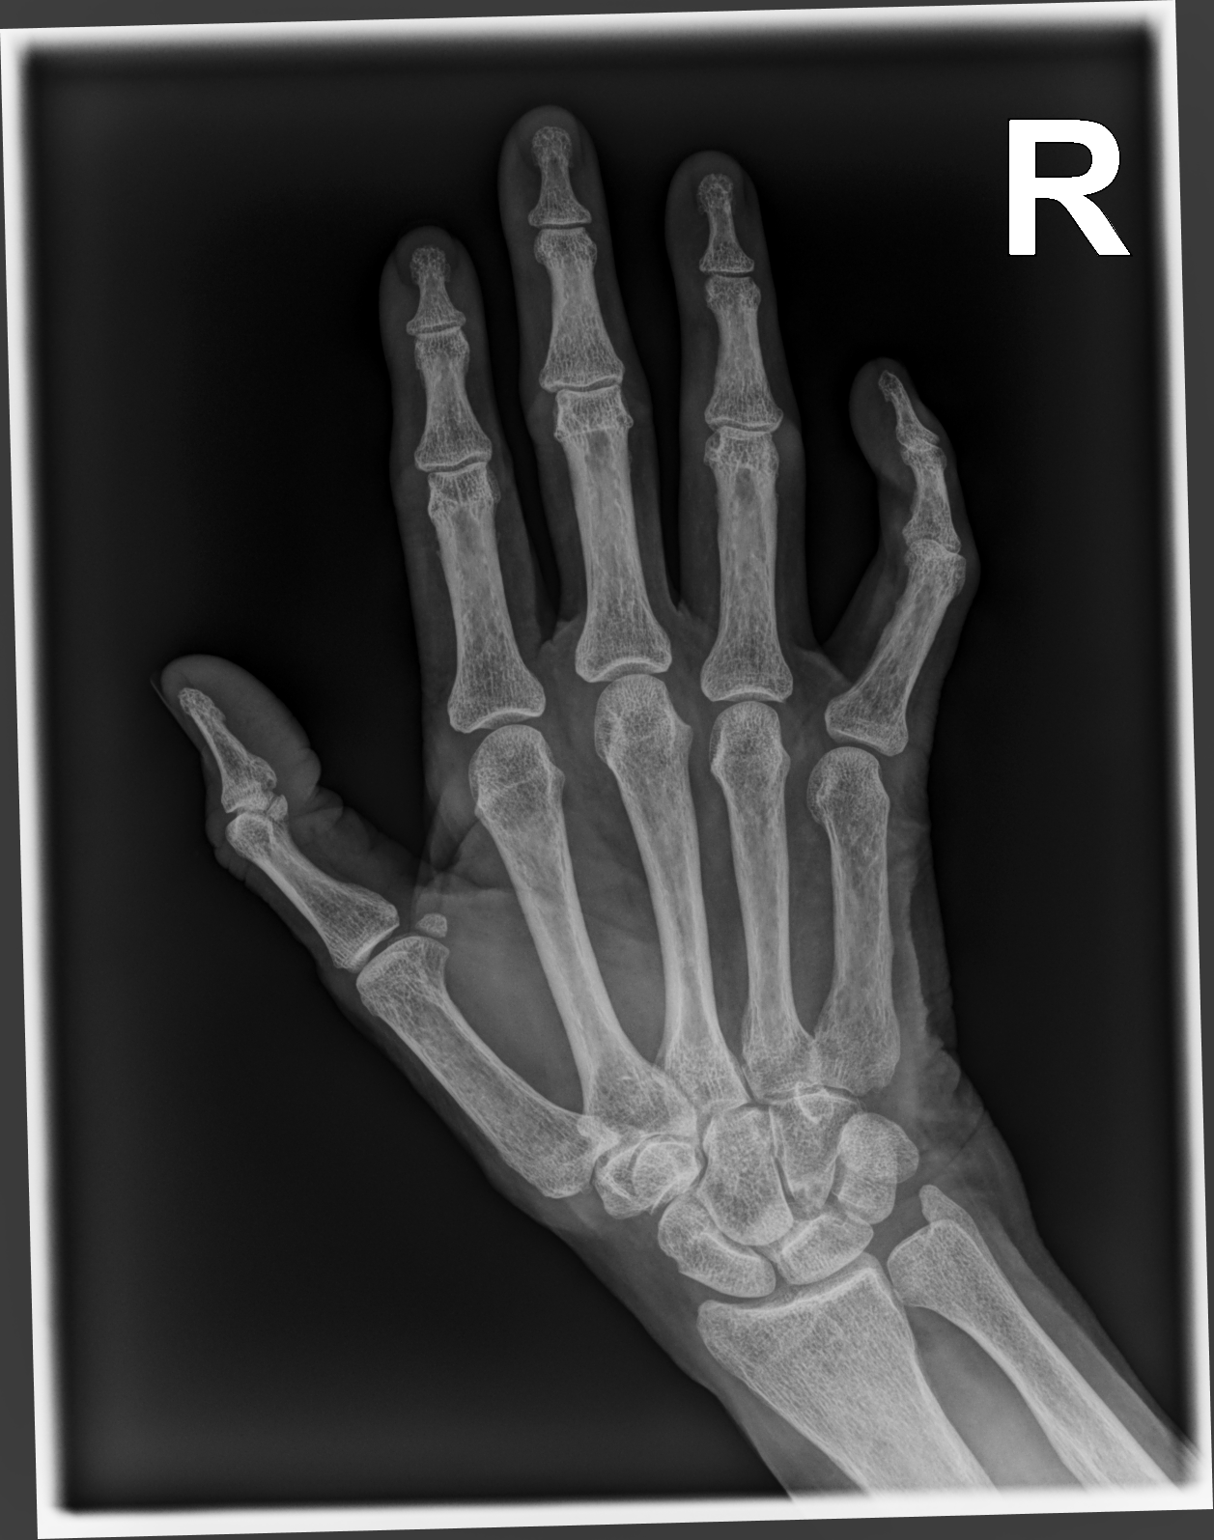

[hand mlo]
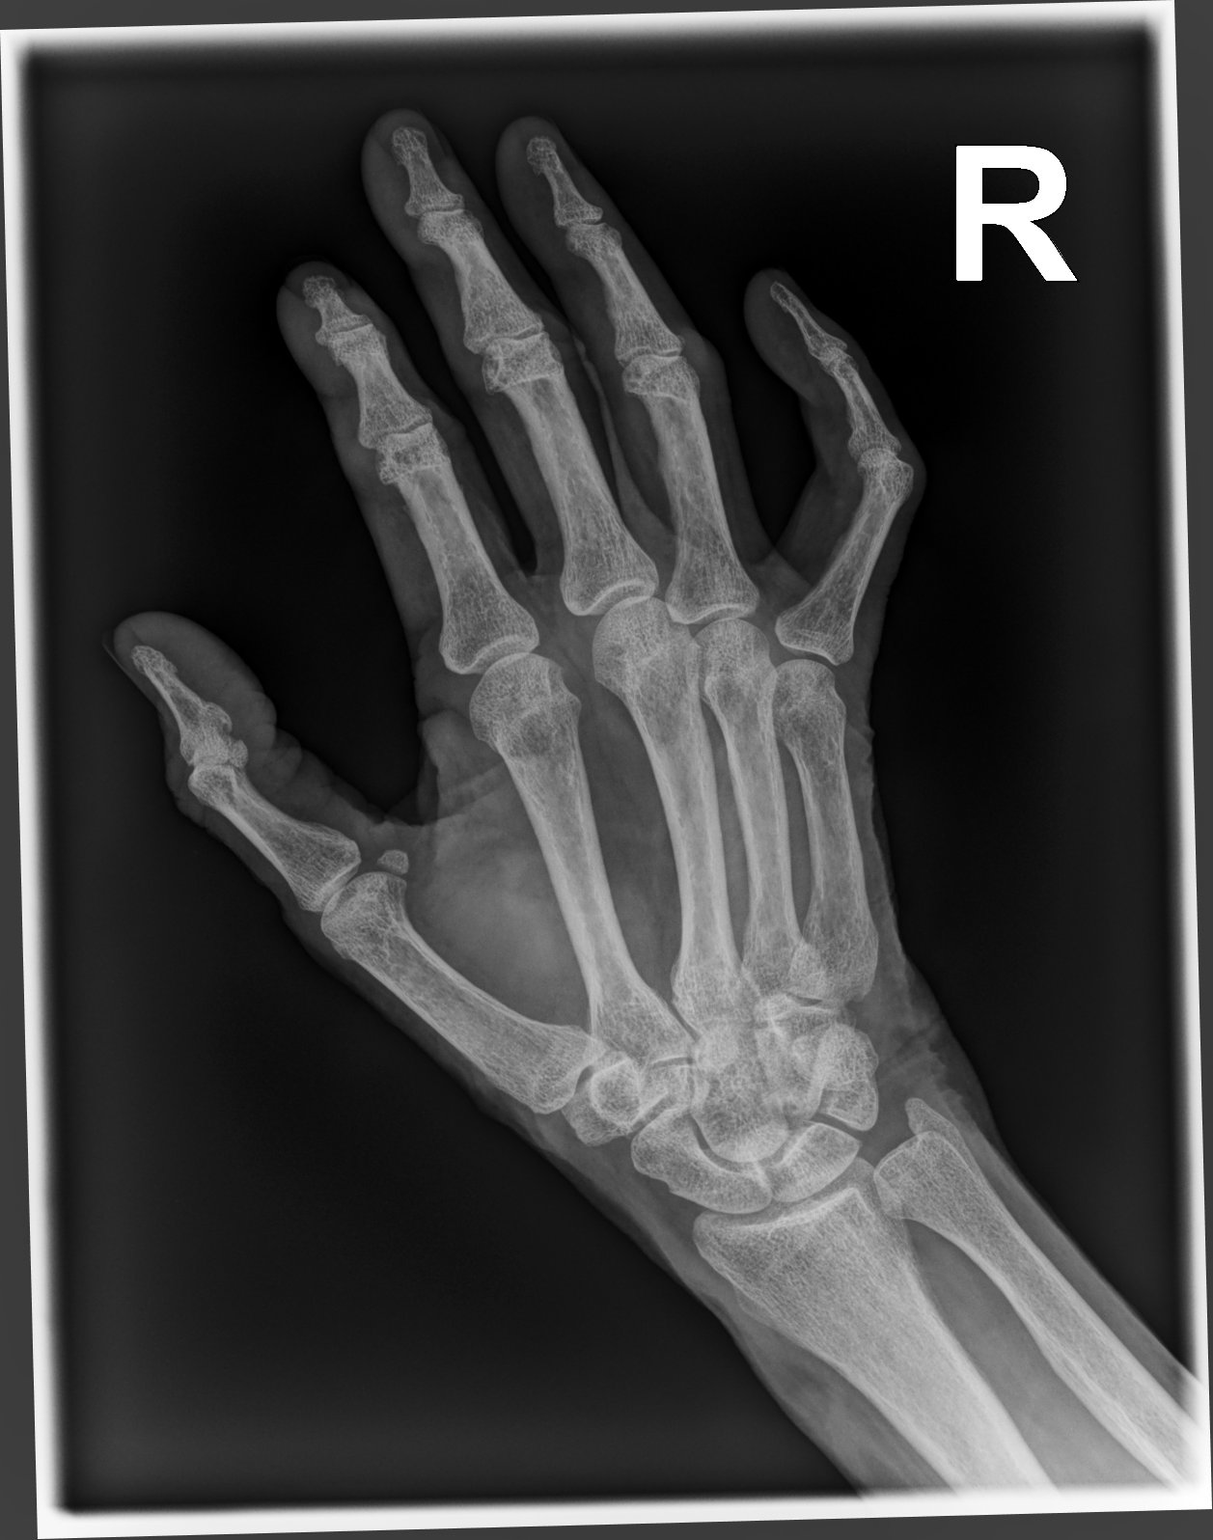

[hand lat]
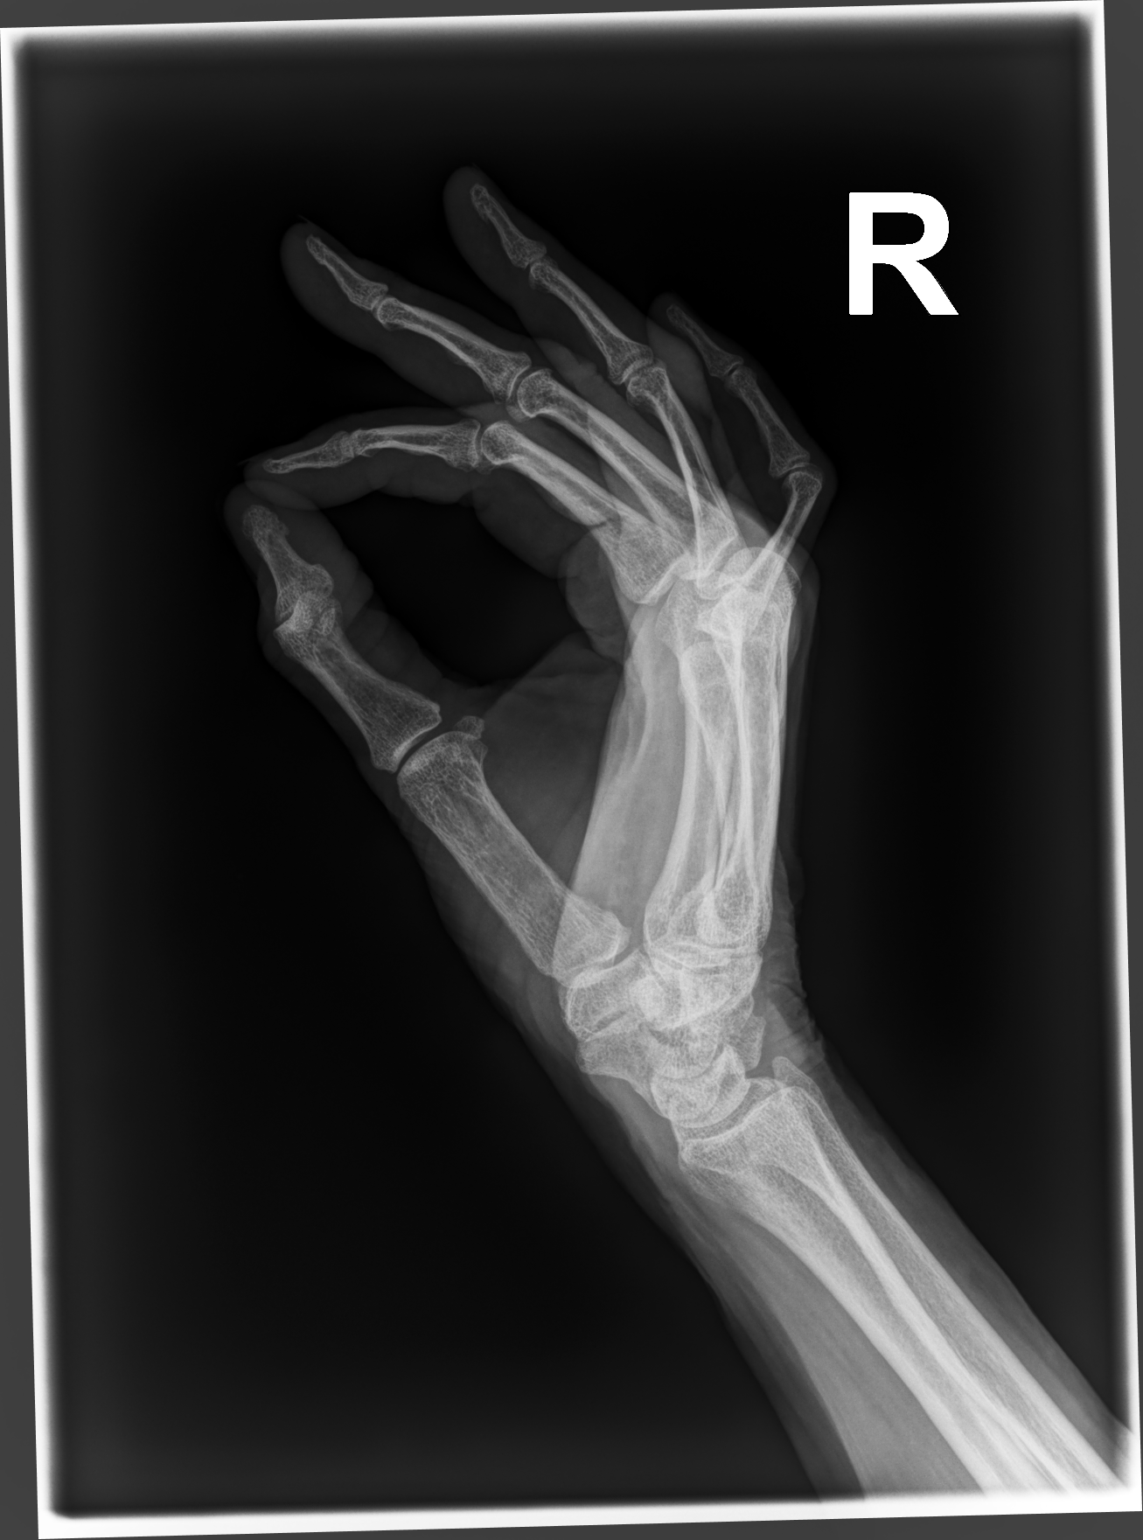

[3 of 3 positions shown; findings below may reference images not displayed]

FINDINGS: No acute bony or joint abnormality identified. No evidence of
fracture dislocation. Diffuse degenerative change.
IMPRESSION: Diffuse degenerative change.  No acute abnormality identified.

## 2021-09-29 ENCOUNTER — Other Ambulatory Visit: Payer: Self-pay | Admitting: Family Medicine

## 2021-10-21 ENCOUNTER — Encounter: Payer: Self-pay | Admitting: Family Medicine

## 2021-10-22 ENCOUNTER — Other Ambulatory Visit: Payer: Self-pay

## 2021-10-22 DIAGNOSIS — I10 Essential (primary) hypertension: Secondary | ICD-10-CM

## 2021-10-22 MED ORDER — AMLODIPINE BESYLATE 5 MG PO TABS: 5.0000 mg | ORAL_TABLET | Freq: Every day | ORAL | 1 refills | Status: DC

## 2021-10-27 ENCOUNTER — Other Ambulatory Visit: Payer: Self-pay | Admitting: Family Medicine

## 2021-11-11 ENCOUNTER — Ambulatory Visit: Payer: Medicare HMO | Admitting: Podiatry

## 2021-11-11 DIAGNOSIS — M722 Plantar fascial fibromatosis: Secondary | ICD-10-CM

## 2021-11-11 MED ORDER — BETAMETHASONE SOD PHOS & ACET 6 (3-3) MG/ML IJ SUSP
3.0000 mg | Freq: Once | INTRAMUSCULAR | Status: AC
  Administered 2021-11-11: 3 mg via INTRA_ARTICULAR

## 2021-11-11 NOTE — Progress Notes (Signed)
? ?Subjective: ?76 y.o. female presenting today for follow-up evaluation of plantar fasciitis to the right foot as well as a symptomatic corn/callus to the left fifth toe.  Patient states that she works on her feet for several hours throughout the day which is very demanding and she has been getting significant foot pain.  She says the injections do help temporarily.  She has tried multiple different shoes and arch supports with no significant improvement ? ?Past Medical History:  ?Diagnosis Date  ? ALLERGIC RHINITIS CAUSE UNSPECIFIED 04/30/2009  ? Anxiety   ? DEPRESSION, HX OF 04/11/2010  ? Dysuria 05/06/2010  ? Gallstones   ? GERD 04/30/2009  ? HYPERLIPIDEMIA 04/30/2009  ? HYPERTENSION 04/30/2009  ? LEG EDEMA 12/12/2009  ? OSTEOPENIA 04/30/2009  ? PVC (premature ventricular contraction)   ? Status post dilation of esophageal narrowing   ? ?Past Surgical History:  ?Procedure Laterality Date  ? ABDOMINAL HYSTERECTOMY  1986  ? AUGMENTATION MAMMAPLASTY Bilateral   ? BREAST ENHANCEMENT SURGERY  1987  ? BREAST SURGERY  1974  ? biopsy  ? CARDIAC CATHETERIZATION N/A 11/02/2015  ? Procedure: Left Heart Cath and Coronary Angiography;  Surgeon: Belva Crome, MD;  Location: [facility];  Service: Cardiovascular;  Laterality: N/A;  ? Carpel tunel Right   ? CATARACT EXTRACTION, BILATERAL Bilateral   ? one in feb and one in March Dr. Tommy Rainwater  ? CHOLECYSTECTOMY  1974  ? ectopic pregnancy    ? OTHER SURGICAL HISTORY    ? Stomach was not developed at birth  ? ?Allergies  ?Allergen Reactions  ? Doxycycline Nausea And Vomiting  ? Erythromycin Base Other (See Comments)  ?  yeast infection, GI upset  ? Naproxen Other (See Comments)  ?  GI upset  ? Prednisone Hives  ?  REACTION: hives, anxiety, insomnia  ? Scallops [Shellfish Allergy] Nausea And Vomiting  ? ? ? ?Objective: ?Physical Exam ?General: The patient is alert and oriented x3 in no acute distress. ? ?Dermatology: Skin is warm, dry and supple bilateral lower extremities.  Negative for open lesions or macerations bilateral.  Hyperkeratotic symptomatic corn noted to the dorsal aspect of the left fifth toe overlying the area of the PIPJ ? ?Vascular: Dorsalis Pedis and Posterior Tibial pulses palpable bilateral.  Capillary fill time is immediate to all digits. ? ?Neurological: Epicritic and protective threshold intact bilateral.  ? ?Musculoskeletal: There continues to be tenderness to palpation to the plantar aspect of the right heel along the plantar fascia. All other joints range of motion within normal limits bilateral.  Hammertoe contracture deformity noted to the fifth digit of the left foot with hard osseous bone growth around the area of the PIPJ.  Overlying symptomatic corn also noted ? Strength 5/5 in all groups bilateral.  ? ? ?Assessment: ?1. Plantar fasciitis right ?2.  Symptomatic hammertoe with overlying callus/corn fifth digit left ?3. H/o arthroplasty fifth digit left 20+ years ago ? ?Plan of Care:  ?1. Patient evaluated.  ?2.  Injection of 0.5 cc Celestone Soluspan injected into the right plantar fascia ?3.  No NSAIDs prescribed.  Patient has experienced GI sensitivity with diclofenac and meloxicam  ?4.  No steroid prescribed.  Patient states she breaks out in hives and it makes her "crazy" ?5.  Prescription for tramadol 50 mg every 6 hours as needed pain ?6.  Continue wearing OTC power steps and good supportive shoes.  Patient presented today with all of her shoes and they all seem to have good  arch supports to be supportive to the feet ?7.  Return to clinic as needed ? 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 ?Edrick Kins, DPM ?[facility] ? ?Dr. Edrick Kins, DPM  ?  ?[address]                                        ?[place], [place] [address]                ?Office [phone]  ?Fax [phone] ? ? ? ? ?

## 2021-11-13 ENCOUNTER — Other Ambulatory Visit: Payer: Self-pay | Admitting: Podiatry

## 2021-11-16 ENCOUNTER — Encounter: Payer: Self-pay | Admitting: Podiatry

## 2021-11-18 ENCOUNTER — Other Ambulatory Visit: Payer: Self-pay | Admitting: Podiatry

## 2021-11-18 NOTE — Telephone Encounter (Signed)
Please advise 

## 2021-11-20 ENCOUNTER — Other Ambulatory Visit: Payer: Self-pay | Admitting: Podiatry

## 2021-11-22 ENCOUNTER — Other Ambulatory Visit: Payer: Self-pay | Admitting: Podiatry

## 2021-11-22 MED ORDER — TRAMADOL HCL 50 MG PO TABS: ORAL_TABLET | ORAL | 0 refills | Status: DC

## 2022-01-22 ENCOUNTER — Other Ambulatory Visit: Payer: Self-pay | Admitting: Family Medicine

## 2022-02-23 ENCOUNTER — Encounter: Payer: Self-pay | Admitting: Family Medicine

## 2022-03-03 ENCOUNTER — Other Ambulatory Visit: Payer: Self-pay | Admitting: Family Medicine

## 2022-03-28 ENCOUNTER — Other Ambulatory Visit: Payer: Self-pay | Admitting: Family Medicine

## 2022-04-07 ENCOUNTER — Ambulatory Visit (INDEPENDENT_AMBULATORY_CARE_PROVIDER_SITE_OTHER): Payer: Medicare HMO | Admitting: Family Medicine

## 2022-04-07 VITALS — BP 136/82 | HR 59 | Temp 97.7°F | Wt 181.9 lb

## 2022-04-07 DIAGNOSIS — I10 Essential (primary) hypertension: Secondary | ICD-10-CM

## 2022-04-07 DIAGNOSIS — M159 Polyosteoarthritis, unspecified: Secondary | ICD-10-CM | POA: Insufficient documentation

## 2022-04-07 DIAGNOSIS — Z23 Encounter for immunization: Secondary | ICD-10-CM | POA: Diagnosis not present

## 2022-04-07 DIAGNOSIS — E785 Hyperlipidemia, unspecified: Secondary | ICD-10-CM | POA: Diagnosis not present

## 2022-04-07 LAB — HEPATIC FUNCTION PANEL
ALT: 17 U/L (ref 0–35)
AST: 21 U/L (ref 0–37)
Albumin: 4.1 g/dL (ref 3.5–5.2)
Alkaline Phosphatase: 77 U/L (ref 39–117)
Bilirubin, Direct: 0.2 mg/dL (ref 0.0–0.3)
Total Bilirubin: 0.9 mg/dL (ref 0.2–1.2)
Total Protein: 6.8 g/dL (ref 6.0–8.3)

## 2022-04-07 LAB — BASIC METABOLIC PANEL
BUN: 18 mg/dL (ref 6–23)
CO2: 29 mEq/L (ref 19–32)
Calcium: 9.8 mg/dL (ref 8.4–10.5)
Chloride: 103 mEq/L (ref 96–112)
Creatinine, Ser: 0.77 mg/dL (ref 0.40–1.20)
GFR: 75.59 mL/min (ref >60.00)
Glucose, Bld: 97 mg/dL (ref 70–99)
Potassium: 4 mEq/L (ref 3.5–5.1)
Sodium: 140 mEq/L (ref 135–145)

## 2022-04-07 LAB — LIPID PANEL
Cholesterol: 143 mg/dL (ref 0–200)
HDL: 69.2 mg/dL (ref >39.00)
LDL Cholesterol: 63 mg/dL (ref 0–99)
NonHDL: 73.43
Total CHOL/HDL Ratio: 2
Triglycerides: 51 mg/dL (ref 0.0–149.0)
VLDL: 10.2 mg/dL (ref 0.0–40.0)

## 2022-04-07 MED ORDER — ROSUVASTATIN CALCIUM 10 MG PO TABS: 10.0000 mg | ORAL_TABLET | Freq: Every day | ORAL | 3 refills | Status: DC

## 2022-04-07 MED ORDER — TRAMADOL HCL 50 MG PO TABS: ORAL_TABLET | ORAL | 1 refills | Status: DC

## 2022-04-07 MED ORDER — AMLODIPINE BESYLATE 5 MG PO TABS: 5.0000 mg | ORAL_TABLET | Freq: Every day | ORAL | 3 refills | Status: DC

## 2022-04-07 NOTE — Progress Notes (Signed)
Established Patient Office Visit  Subjective   Patient ID: Rebecca Reid, female    DOB: [birth year]  Age: 76 y.o. MRN: [id number]  Chief Complaint  Patient presents with   Medication Refill    Thinks labs for statins   Knee Pain    Right knee "tender & sore" x couple months. Has taken OTC meds that have not been effective.    HPI   Here to discuss several issues as follows  Osteoarthritis history.  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  She plans to transition out of this sometime in October.  She has had significant hip, knee, and ankle pain bilaterally.  Frequently has to go up and down the ladder.  She has taken Tylenol in the past without much relief.  Cannot tolerate Aleve secondary to upset stomach.  Has taken tramadol sporadically which helps and usually just 1 and sometimes 2/day.  Would like to discuss refills.  She has hyperlipidemia treated with Crestor.  Overdue for labs.  No myalgias.  She has hypertension treated with losartan HCTZ and amlodipine.  No recent dizziness.  Blood pressure well controlled by home readings.  Past Medical History:  Diagnosis Date   ALLERGIC RHINITIS CAUSE UNSPECIFIED 04/30/2009   Anxiety    DEPRESSION, HX OF 04/11/2010   Dysuria 05/06/2010   Gallstones    GERD 04/30/2009   HYPERLIPIDEMIA 04/30/2009   HYPERTENSION 04/30/2009   LEG EDEMA 12/12/2009   OSTEOPENIA 04/30/2009   PVC (premature ventricular contraction)    Status post dilation of esophageal narrowing    Past Surgical History:  Procedure Laterality Date   ABDOMINAL HYSTERECTOMY  1986   AUGMENTATION MAMMAPLASTY Bilateral    BREAST ENHANCEMENT SURGERY  1987   BREAST SURGERY  1974   biopsy   CARDIAC CATHETERIZATION N/A 11/02/2015   Procedure: Left Heart Cath and Coronary Angiography;  Surgeon: Belva Crome, MD;  Location: [facility];  Service: Cardiovascular;  Laterality: N/A;   Carpel tunel Right    CATARACT EXTRACTION,  BILATERAL Bilateral    one in feb and one in March Dr. Tommy Rainwater   CHOLECYSTECTOMY  1974   ectopic pregnancy     OTHER SURGICAL HISTORY     Stomach was not developed at birth    reports that she has never smoked. She has never used smokeless tobacco. She reports current alcohol use. She reports that she does not use drugs. family history includes Arthritis in her mother; Emphysema in her mother; Heart disease (age of onset: 100) in her father; Heart disease (age of onset: 78) in her mother; Hyperlipidemia in her father and mother; Hypertension in her father and mother; Prostate cancer in her father; Skin cancer in her mother. Allergies  Allergen Reactions   Doxycycline Nausea And Vomiting   Erythromycin Base Other (See Comments)    yeast infection, GI upset   Naproxen Other (See Comments)    GI upset   Prednisone Hives    REACTION: hives, anxiety, insomnia   Scallops [Shellfish Allergy] Nausea And Vomiting    Review of Systems  Constitutional:  Negative for malaise/fatigue.  Eyes:  Negative for blurred vision.  Respiratory:  Negative for shortness of breath.   Cardiovascular:  Negative for chest pain.  Musculoskeletal:  Positive for joint pain. Negative for myalgias.  Neurological:  Negative for dizziness, weakness and headaches.      Objective:     BP 136/82 (BP Location: Right Arm, Patient  Position: Sitting, Cuff Size: Normal)   Pulse (!) 59   Temp 97.7 F (36.5 C) (Oral)   Wt 181 lb 14.4 oz (82.5 kg)   SpO2 97%   BMI 29.36 kg/m    Physical Exam Vitals reviewed.  Constitutional:      Appearance: She is well-developed.  Eyes:     Pupils: Pupils are equal, round, and reactive to light.  Neck:     Thyroid: No thyromegaly.     Vascular: No JVD.  Cardiovascular:     Rate and Rhythm: Normal rate and regular rhythm.     Heart sounds:     No gallop.  Pulmonary:     Effort: Pulmonary effort is normal. No respiratory distress.     Breath sounds: Normal breath sounds. No  wheezing or rales.  Musculoskeletal:     Cervical back: Neck supple.     Comments: Right knee reveals no effusion.  Good range of motion.  She does have some mild medial joint line tenderness.  No warmth.  No ecchymosis.  Neurological:     Mental Status: She is alert.      No results found for any visits on 04/07/22.    The 10-year ASCVD risk score (Arnett DK, et al., 2019) is: 22.9%    Assessment & Plan:   Problem List Items Addressed This Visit       Unprioritized   Essential hypertension   Relevant Medications   losartan-hydrochlorothiazide (HYZAAR) 100-25 MG tablet   amLODipine (NORVASC) 5 MG tablet   rosuvastatin (CRESTOR) 10 MG tablet   Other Relevant Orders   Basic metabolic panel   Dyslipidemia   Relevant Medications   rosuvastatin (CRESTOR) 10 MG tablet   Other Relevant Orders   Lipid panel   Hepatic function panel   Other Visit Diagnoses     Flu vaccine need    -  Primary   Relevant Orders   Flu Vaccine QUAD High Dose(Fluad) (Completed)     Patient also has osteoarthritis involving multiple joints.  She has tried over-the-counter medication such as Aleve but cannot tolerate.  Also not a good candidate for chronic long-term nonsteroidals.  Has also previously taken medication such as meloxicam and Voltaren without improvement.  No relief with Tylenol.  We agreed to write for Ultram 50 mg number 60-  one tablet every 12 hours as needed for osteoarthritis pain -Also discussed importance of regular physical exercise.  [facility] [employer] [facility]  -Refills of medications as above with lab work as above  No follow-ups on file.    Carolann Littler, MD

## 2022-04-21 ENCOUNTER — Encounter: Payer: Self-pay | Admitting: Family Medicine

## 2022-04-21 ENCOUNTER — Other Ambulatory Visit: Payer: Self-pay | Admitting: Family Medicine

## 2022-04-21 DIAGNOSIS — I10 Essential (primary) hypertension: Secondary | ICD-10-CM

## 2022-04-25 ENCOUNTER — Encounter: Payer: Self-pay | Admitting: Family Medicine

## 2022-04-25 ENCOUNTER — Ambulatory Visit (INDEPENDENT_AMBULATORY_CARE_PROVIDER_SITE_OTHER): Payer: Medicare HMO | Admitting: Family Medicine

## 2022-04-25 VITALS — BP 126/70 | HR 70 | Temp 97.5°F | Ht 66.0 in | Wt 183.6 lb

## 2022-04-25 DIAGNOSIS — R42 Dizziness and giddiness: Secondary | ICD-10-CM | POA: Diagnosis not present

## 2022-04-25 DIAGNOSIS — R5383 Other fatigue: Secondary | ICD-10-CM

## 2022-04-25 DIAGNOSIS — E559 Vitamin D deficiency, unspecified: Secondary | ICD-10-CM | POA: Diagnosis not present

## 2022-04-25 DIAGNOSIS — I10 Essential (primary) hypertension: Secondary | ICD-10-CM

## 2022-04-25 NOTE — Progress Notes (Signed)
Established Patient Office Visit  Subjective   Patient ID: Rebecca Reid, female    DOB: Jun 27, 1946  Age: 76 y.o. MRN: [id number]  Chief Complaint  Patient presents with   Dizziness    Patient complains of dizziness, x3 days    Fatigue    Patient complains of fatigue, x2 weeks     HPI    Rebecca Reid is seen with complaints of some dizziness and fatigue.  Her fatigue has been somewhat more chronic really for months but especially past couple weeks.  She quit her job this past Wednesday.  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  This past week on Tuesday she felt particularly weak and dizzy.  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  She has been sleeping longer and still feels fatigue during the day.  Occasional lightheadedness.  No syncope.  No chest pain.  No palpitations.  No recent medication changes.  She had full cardiac work-up about 6 years ago with cardiac cath with normal coronaries.  She has hypertension treated with amlodipine, metoprolol succinate, and losartan HCTZ.  Denies any fever or chills.  She does have history of low vitamin D.  Had some recent myalgias.  Currently not taking vitamin D supplement.  Past Medical History:  Diagnosis Date   ALLERGIC RHINITIS CAUSE UNSPECIFIED 04/30/2009   Anxiety    DEPRESSION, HX OF 04/11/2010   Dysuria 05/06/2010   Gallstones    GERD 04/30/2009   HYPERLIPIDEMIA 04/30/2009   HYPERTENSION 04/30/2009   LEG EDEMA 12/12/2009   OSTEOPENIA 04/30/2009   PVC (premature ventricular contraction)    Status post dilation of esophageal narrowing    Past Surgical History:  Procedure Laterality Date   ABDOMINAL HYSTERECTOMY  1986   AUGMENTATION MAMMAPLASTY Bilateral    BREAST ENHANCEMENT SURGERY  1987   BREAST SURGERY  1974   biopsy   CARDIAC CATHETERIZATION N/A 11/02/2015    Procedure: Left Heart Cath and Coronary Angiography;  Surgeon: Belva Crome, MD;  Location: [facility];  Service: Cardiovascular;  Laterality: N/A;   Carpel tunel Right    CATARACT EXTRACTION, BILATERAL Bilateral    one in feb and one in March Dr. Tommy Rainwater   CHOLECYSTECTOMY  1974   ectopic pregnancy     OTHER SURGICAL HISTORY     Stomach was not developed at birth    reports that she has never smoked. She has never used smokeless tobacco. She reports current alcohol use. She reports that she does not use drugs. family history includes Arthritis in her mother; Emphysema in her mother; Heart disease (age of onset: 7) in her father; Heart disease (age of onset: 58) in her mother; Hyperlipidemia in her father and mother; Hypertension in her father and mother; Prostate cancer in her father; Skin cancer in her mother. Allergies  Allergen Reactions   Doxycycline Nausea And Vomiting   Erythromycin Base Other (See Comments)    yeast infection, GI upset   Naproxen Other (See Comments)    GI upset   Prednisone Hives    REACTION: hives, anxiety, insomnia   Scallops [Shellfish Allergy] Nausea And Vomiting    Review of Systems  Constitutional:  Positive for malaise/fatigue. Negative for chills, fever and weight loss.  Respiratory:  Negative for shortness of breath.   Cardiovascular:  Negative  for chest pain and palpitations.  Genitourinary:  Negative for dysuria.  Neurological:  Positive for dizziness. Negative for focal weakness and headaches.      Objective:     BP 126/70 (BP Location: Right Arm, Patient Position: Sitting, Cuff Size: Normal)   Pulse 70   Temp (!) 97.5 F (36.4 C) (Oral)   Ht '5\' 6"'$  (1.676 m)   Wt 183 lb 9.6 oz (83.3 kg)   SpO2 98%   BMI 29.63 kg/m  BP Readings from Last 3 Encounters:  04/25/22 126/70  04/07/22 136/82  09/23/21 122/73   Wt Readings from Last 3 Encounters:  04/25/22 183 lb 9.6 oz (83.3 kg)  04/07/22 181 lb 14.4 oz (82.5 kg)  09/23/21 177  lb (80.3 kg)      Physical Exam Vitals reviewed.  Constitutional:      Appearance: Normal appearance.  Cardiovascular:     Rate and Rhythm: Normal rate and regular rhythm.  Pulmonary:     Effort: Pulmonary effort is normal.     Breath sounds: Normal breath sounds. No wheezing or rales.  Musculoskeletal:     Cervical back: Neck supple.  Lymphadenopathy:     Cervical: No cervical adenopathy.  Neurological:     Mental Status: She is alert.      No results found for any visits on 04/25/22.    The 10-year ASCVD risk score (Arnett DK, et al., 2019) is: 20%    Assessment & Plan:   #1 nonspecific fatigue.  Patient's had progressive fatigue for weeks if not months.  She wonders if some of this may be over work.  She did quit her job couple days ago.  She has also had some intermittent lightheadedness.  No consistent orthostatic symptoms. -Check further labs with CBC, CMP, TSH -Get plenty of sleep -Try to start regular exercise program with minimum 150 minutes of moderate aerobic activity per week  #2 hypertension stable and well-controlled on medications including amlodipine, losartan HCTZ, and metoprolol.  #3 history of vitamin D deficiency.  Recheck 25-hydroxy vitamin D level.   No follow-ups on file.    Carolann Littler, MD

## 2022-04-27 ENCOUNTER — Encounter: Payer: Self-pay | Admitting: Family Medicine

## 2022-04-28 ENCOUNTER — Other Ambulatory Visit (INDEPENDENT_AMBULATORY_CARE_PROVIDER_SITE_OTHER): Payer: Medicare HMO

## 2022-04-28 DIAGNOSIS — R5383 Other fatigue: Secondary | ICD-10-CM

## 2022-04-28 DIAGNOSIS — E559 Vitamin D deficiency, unspecified: Secondary | ICD-10-CM

## 2022-04-28 LAB — COMPREHENSIVE METABOLIC PANEL
ALT: 15 U/L (ref 0–35)
AST: 19 U/L (ref 0–37)
Albumin: 4.1 g/dL (ref 3.5–5.2)
Alkaline Phosphatase: 70 U/L (ref 39–117)
BUN: 22 mg/dL (ref 6–23)
CO2: 29 mEq/L (ref 19–32)
Calcium: 9.5 mg/dL (ref 8.4–10.5)
Chloride: 102 mEq/L (ref 96–112)
Creatinine, Ser: 0.76 mg/dL (ref 0.40–1.20)
GFR: 76.76 mL/min (ref >60.00)
Glucose, Bld: 94 mg/dL (ref 70–99)
Potassium: 3.5 mEq/L (ref 3.5–5.1)
Sodium: 138 mEq/L (ref 135–145)
Total Bilirubin: 0.7 mg/dL (ref 0.2–1.2)
Total Protein: 6.8 g/dL (ref 6.0–8.3)

## 2022-04-28 LAB — CBC WITH DIFFERENTIAL/PLATELET
Basophils Absolute: 0.1 10*3/uL (ref 0.0–0.1)
Basophils Relative: 0.9 % (ref 0.0–3.0)
Eosinophils Absolute: 0.2 10*3/uL (ref 0.0–0.7)
Eosinophils Relative: 2 % (ref 0.0–5.0)
HCT: 40.5 % (ref 36.0–46.0)
Hemoglobin: 13.5 g/dL (ref 12.0–15.0)
Lymphocytes Relative: 22.5 % (ref 12.0–46.0)
Lymphs Abs: 1.7 10*3/uL (ref 0.7–4.0)
MCHC: 33.3 g/dL (ref 30.0–36.0)
MCV: 84.7 fl (ref 78.0–100.0)
Monocytes Absolute: 0.6 10*3/uL (ref 0.1–1.0)
Monocytes Relative: 7.8 % (ref 3.0–12.0)
Neutro Abs: 5.2 10*3/uL (ref 1.4–7.7)
Neutrophils Relative %: 66.8 % (ref 43.0–77.0)
Platelets: 217 10*3/uL (ref 150.0–400.0)
RBC: 4.77 Mil/uL (ref 3.87–5.11)
RDW: 14.7 % (ref 11.5–15.5)
WBC: 7.8 10*3/uL (ref 4.0–10.5)

## 2022-04-28 LAB — TSH: TSH: 4.4 u[IU]/mL (ref 0.35–5.50)

## 2022-04-28 LAB — VITAMIN D 25 HYDROXY (VIT D DEFICIENCY, FRACTURES): VITD: 48.41 ng/mL (ref 30.00–100.00)

## 2022-05-17 ENCOUNTER — Encounter: Payer: Self-pay | Admitting: Family Medicine

## 2022-05-19 ENCOUNTER — Other Ambulatory Visit: Payer: Self-pay

## 2022-05-19 MED ORDER — AZELASTINE HCL 0.1 % NA SOLN: 2.0000 | Freq: Two times a day (BID) | NASAL | 1 refills | Status: DC

## 2022-06-06 ENCOUNTER — Ambulatory Visit (INDEPENDENT_AMBULATORY_CARE_PROVIDER_SITE_OTHER): Payer: Medicare HMO | Admitting: Family Medicine

## 2022-06-06 VITALS — BP 140/76 | HR 65 | Temp 97.8°F | Wt 184.7 lb

## 2022-06-06 DIAGNOSIS — R600 Localized edema: Secondary | ICD-10-CM

## 2022-06-06 DIAGNOSIS — M79604 Pain in right leg: Secondary | ICD-10-CM | POA: Diagnosis not present

## 2022-06-06 DIAGNOSIS — M79605 Pain in left leg: Secondary | ICD-10-CM

## 2022-06-06 DIAGNOSIS — I10 Essential (primary) hypertension: Secondary | ICD-10-CM

## 2022-06-06 NOTE — Progress Notes (Signed)
Established Patient Office Visit  Subjective   Patient ID: Rebecca Reid, female    DOB: 1946/03/18  Age: 76 y.o. MRN: [id number]  Chief Complaint  Patient presents with   Follow-up    HPI   Rebecca Reid is seen with bilateral lower extremity pain basically from her thigh all way down toward her lower leg for several weeks.  No cramps.  She has soreness and achiness which she feels like is more muscle than joint.  She has noticed some improvement in her overall fatigue since quitting work several weeks ago.  Denies any restless leg symptoms.  She had recent labs and vitamin D level was improved at 48.  TSH was normal.  Denies any claudication symptoms.  Non-smoker.  She does feel like her legs are somewhat edematous as well but no pitting edema.  She does take amlodipine 5 mg daily.  No orthopnea.  No dizziness.  Past Medical History:  Diagnosis Date   ALLERGIC RHINITIS CAUSE UNSPECIFIED 04/30/2009   Anxiety    DEPRESSION, HX OF 04/11/2010   Dysuria 05/06/2010   Gallstones    GERD 04/30/2009   HYPERLIPIDEMIA 04/30/2009   HYPERTENSION 04/30/2009   LEG EDEMA 12/12/2009   OSTEOPENIA 04/30/2009   PVC (premature ventricular contraction)    Status post dilation of esophageal narrowing    Past Surgical History:  Procedure Laterality Date   ABDOMINAL HYSTERECTOMY  1986   AUGMENTATION MAMMAPLASTY Bilateral    BREAST ENHANCEMENT SURGERY  1987   BREAST SURGERY  1974   biopsy   CARDIAC CATHETERIZATION N/A 11/02/2015   Procedure: Left Heart Cath and Coronary Angiography;  Surgeon: Belva Crome, MD;  Location: [facility];  Service: Cardiovascular;  Laterality: N/A;   Carpel tunel Right    CATARACT EXTRACTION, BILATERAL Bilateral    one in feb and one in March Dr. Tommy Rainwater   CHOLECYSTECTOMY  1974   ectopic pregnancy     OTHER SURGICAL HISTORY     Stomach was not developed at birth    reports that she has never smoked. She has never used smokeless tobacco. She reports current  alcohol use. She reports that she does not use drugs. family history includes Arthritis in her mother; Emphysema in her mother; Heart disease (age of onset: 18) in her father; Heart disease (age of onset: 54) in her mother; Hyperlipidemia in her father and mother; Hypertension in her father and mother; Prostate cancer in her father; Skin cancer in her mother. Allergies  Allergen Reactions   Doxycycline Nausea And Vomiting   Erythromycin Base Other (See Comments)    yeast infection, GI upset   Naproxen Other (See Comments)    GI upset   Prednisone Hives    REACTION: hives, anxiety, insomnia   Scallops [Shellfish Allergy] Nausea And Vomiting     Review of Systems  Constitutional:  Negative for chills, fever and malaise/fatigue.  Eyes:  Negative for blurred vision.  Respiratory:  Negative for shortness of breath.   Cardiovascular:  Positive for leg swelling. Negative for chest pain.  Neurological:  Negative for dizziness, weakness and headaches.      Objective:     BP (!) 140/76 (BP Location: Left Arm, Patient Position: Sitting, Cuff Size: Normal)   Pulse 65   Temp 97.8 F (36.6 C) (Oral)   Wt 184 lb 11.2 oz (83.8 kg)   SpO2 94%   BMI 29.81 kg/m    Physical Exam Constitutional:      Appearance: She is  well-developed.  Eyes:     Pupils: Pupils are equal, round, and reactive to light.  Neck:     Thyroid: No thyromegaly.     Vascular: No JVD.  Cardiovascular:     Rate and Rhythm: Normal rate and regular rhythm.     Heart sounds:     No gallop.  Pulmonary:     Effort: Pulmonary effort is normal. No respiratory distress.     Breath sounds: Normal breath sounds. No wheezing or rales.  Musculoskeletal:     Cervical back: Neck supple.     Comments: Lower extremities reveals no pitting edema.  She has some varicosities bilaterally.  Fairly large thighs and calves but no localized edema.  No erythema.  No warmth. She has good distal pulses.  Neurological:     Mental  Status: She is alert.      No results found for any visits on 06/06/22.    The 10-year ASCVD risk score (Arnett DK, et al., 2019) is: 24.1%    Assessment & Plan:   #1 bilateral lower extremity pains.  Question statin related.  Her pain to be more muscular and very diffuse.  Recent TSH normal.  Not describing claudication symptoms.  We recommend leaving off rosuvastatin for the next few weeks and be in touch if this is not improving in 3 to 4 weeks  #2 mild edema lower extremities.  Nonpitting.  Suspect she has some venous stasis.  Also on amlodipine.  Edema though is really mild at this time and doubt related to #1 -Elevate legs frequently  #3 hypertension.  Generally well controlled.  Blood pressure up a little bit today.  Monitor closely next few weeks and be in touch if consistently up above 366 systolic.  She is currently on amlodipine, metoprolol, and losartan HCTZ.  No follow-ups on file.    Carolann Littler, MD

## 2022-06-06 NOTE — Patient Instructions (Signed)
HOLD the Rosuvastatin (Crestor) for now.    Let me know in 3-4 weeks if leg/lower extremity pain not improving  Could also consider trial of coenzyme Q 10 100 to 200 mg once daily

## 2022-07-02 ENCOUNTER — Encounter: Payer: Self-pay | Admitting: Family Medicine

## 2022-07-31 ENCOUNTER — Encounter: Payer: Self-pay | Admitting: Family Medicine

## 2022-08-01 MED ORDER — LOSARTAN POTASSIUM-HCTZ 100-25 MG PO TABS: 1.0000 | ORAL_TABLET | Freq: Every day | ORAL | 0 refills | Status: DC

## 2022-08-29 ENCOUNTER — Other Ambulatory Visit: Payer: Self-pay

## 2022-08-29 ENCOUNTER — Encounter (HOSPITAL_BASED_OUTPATIENT_CLINIC_OR_DEPARTMENT_OTHER): Payer: Self-pay

## 2022-08-29 ENCOUNTER — Emergency Department (HOSPITAL_BASED_OUTPATIENT_CLINIC_OR_DEPARTMENT_OTHER): Payer: Medicare HMO

## 2022-08-29 ENCOUNTER — Emergency Department (HOSPITAL_BASED_OUTPATIENT_CLINIC_OR_DEPARTMENT_OTHER)
Admission: EM | Admit: 2022-08-29 | Discharge: 2022-08-29 | Disposition: A | Discharge status: Home / Self Care | Payer: Medicare HMO | Attending: Emergency Medicine | Admitting: Emergency Medicine

## 2022-08-29 DIAGNOSIS — R1011 Right upper quadrant pain: Secondary | ICD-10-CM | POA: Diagnosis present

## 2022-08-29 DIAGNOSIS — K529 Noninfective gastroenteritis and colitis, unspecified: Secondary | ICD-10-CM | POA: Insufficient documentation

## 2022-08-29 DIAGNOSIS — K760 Fatty (change of) liver, not elsewhere classified: Secondary | ICD-10-CM | POA: Diagnosis not present

## 2022-08-29 DIAGNOSIS — D7389 Other diseases of spleen: Secondary | ICD-10-CM | POA: Diagnosis not present

## 2022-08-29 DIAGNOSIS — K862 Cyst of pancreas: Secondary | ICD-10-CM | POA: Insufficient documentation

## 2022-08-29 LAB — CBC WITH DIFFERENTIAL/PLATELET
Abs Immature Granulocytes: 0.04 10*3/uL (ref 0.00–0.07)
Basophils Absolute: 0 10*3/uL (ref 0.0–0.1)
Basophils Relative: 0 %
Eosinophils Absolute: 0.1 10*3/uL (ref 0.0–0.5)
Eosinophils Relative: 1 %
HCT: 38.7 % (ref 36.0–46.0)
Hemoglobin: 13.1 g/dL (ref 12.0–15.0)
Immature Granulocytes: 0 %
Lymphocytes Relative: 12 %
Lymphs Abs: 1.2 10*3/uL (ref 0.7–4.0)
MCH: 28.1 pg (ref 26.0–34.0)
MCHC: 33.9 g/dL (ref 30.0–36.0)
MCV: 82.9 fL (ref 80.0–100.0)
Monocytes Absolute: 0.6 10*3/uL (ref 0.1–1.0)
Monocytes Relative: 6 %
Neutro Abs: 8 10*3/uL — ABNORMAL HIGH (ref 1.7–7.7)
Neutrophils Relative %: 81 %
Platelets: 208 10*3/uL (ref 150–400)
RBC: 4.67 MIL/uL (ref 3.87–5.11)
RDW: 14 % (ref 11.5–15.5)
WBC: 10 10*3/uL (ref 4.0–10.5)
nRBC: 0 % (ref 0.0–0.2)

## 2022-08-29 LAB — COMPREHENSIVE METABOLIC PANEL
ALT: 22 U/L (ref 0–44)
AST: 32 U/L (ref 15–41)
Albumin: 3.9 g/dL (ref 3.5–5.0)
Alkaline Phosphatase: 78 U/L (ref 38–126)
Anion gap: 10 (ref 5–15)
BUN: 22 mg/dL (ref 8–23)
CO2: 25 mmol/L (ref 22–32)
Calcium: 8.8 mg/dL — ABNORMAL LOW (ref 8.9–10.3)
Chloride: 100 mmol/L (ref 98–111)
Creatinine, Ser: 0.92 mg/dL (ref 0.44–1.00)
GFR, Estimated: >60 mL/min (ref >60)
Glucose, Bld: 119 mg/dL — ABNORMAL HIGH (ref 70–99)
Potassium: 3.9 mmol/L (ref 3.5–5.1)
Sodium: 135 mmol/L (ref 135–145)
Total Bilirubin: 1.3 mg/dL — ABNORMAL HIGH (ref 0.3–1.2)
Total Protein: 7 g/dL (ref 6.5–8.1)

## 2022-08-29 LAB — URINALYSIS, ROUTINE W REFLEX MICROSCOPIC
Bilirubin Urine: NEGATIVE
Glucose, UA: NEGATIVE mg/dL
Hgb urine dipstick: NEGATIVE
Ketones, ur: NEGATIVE mg/dL
Leukocytes,Ua: NEGATIVE
Nitrite: NEGATIVE
Protein, ur: NEGATIVE mg/dL
Specific Gravity, Urine: 1.01 (ref 1.005–1.030)
pH: 7 (ref 5.0–8.0)

## 2022-08-29 LAB — LIPASE, BLOOD: Lipase: 33 U/L (ref 11–51)

## 2022-08-29 MED ORDER — ONDANSETRON 4 MG PO TBDP: 4.0000 mg | ORAL_TABLET | Freq: Three times a day (TID) | ORAL | 0 refills | Status: DC | PRN

## 2022-08-29 MED ORDER — IOHEXOL 300 MG/ML  SOLN
100.0000 mL | Freq: Once | INTRAMUSCULAR | Status: AC | PRN
  Administered 2022-08-29: 100 mL via INTRAVENOUS

## 2022-08-29 MED ORDER — CIPROFLOXACIN HCL 500 MG PO TABS: 500.0000 mg | ORAL_TABLET | Freq: Two times a day (BID) | ORAL | 0 refills | Status: DC

## 2022-08-29 MED ORDER — ONDANSETRON HCL 4 MG/2ML IJ SOLN
4.0000 mg | Freq: Once | INTRAMUSCULAR | Status: AC
  Administered 2022-08-29: 4 mg via INTRAVENOUS
  Filled 2022-08-29: qty 2

## 2022-08-29 MED ORDER — MORPHINE SULFATE (PF) 2 MG/ML IV SOLN
2.0000 mg | INTRAVENOUS | Status: AC
  Administered 2022-08-29: 2 mg via INTRAVENOUS
  Filled 2022-08-29: qty 1

## 2022-08-29 MED ORDER — METRONIDAZOLE 500 MG PO TABS: 500.0000 mg | ORAL_TABLET | Freq: Two times a day (BID) | ORAL | 0 refills | Status: DC

## 2022-08-29 MED ORDER — OXYCODONE HCL 5 MG PO TABS: 5.0000 mg | ORAL_TABLET | ORAL | 0 refills | Status: DC | PRN

## 2022-08-29 MED ORDER — CIPROFLOXACIN HCL 500 MG PO TABS
500.0000 mg | ORAL_TABLET | Freq: Once | ORAL | Status: AC
  Administered 2022-08-29: 500 mg via ORAL
  Filled 2022-08-29: qty 1

## 2022-08-29 MED ORDER — METRONIDAZOLE 500 MG PO TABS
500.0000 mg | ORAL_TABLET | ORAL | Status: AC
  Administered 2022-08-29: 500 mg via ORAL
  Filled 2022-08-29: qty 1

## 2022-08-29 NOTE — Discharge Instructions (Addendum)
You were seen for your abdominal pain in the emergency department.  You were found to have colitis.  At home, please take the antibiotics we have prescribed you (ciprofloxacin and metronidazole).  You may take the Zofran as needed for any nausea that you have.  Take Tylenol for the pain. You may also take the oxycodone we have prescribed you for any breakthrough pain that may have.  Do not take this before driving or operating heavy machinery.  Do not take this medication with alcohol.   Check your MyChart online for the results of any tests that had not resulted by the time you left the emergency department.   Follow-up with your primary doctor in 2-3 days regarding your visit.  Follow-up with GI if your symptoms do not improve.  Return immediately to the emergency department if you experience any of the following: Worsening pain, high fevers, vomiting despite medications, or any other concerning symptoms.    Thank you for visiting our Emergency Department. It was a pleasure taking care of you today.

## 2022-08-29 NOTE — ED Notes (Signed)
ED Provider at bedside. 

## 2022-08-29 NOTE — ED Provider Notes (Signed)
[facility] Provider Note   CSN: [id number] Arrival date & time: 08/29/22  1832     History {Add pertinent medical, surgical, social history, OB history to HPI:1} Chief Complaint  Patient presents with   Abdominal Pain    Rebecca Reid is a 77 y.o. female.  Throbbing R flank last night Worsening Worse with coughing or deep breath Cholecyst, surg when younger No n/v Diarrhea Back pain       Home Medications Prior to Admission medications   Medication Sig Start Date End Date Taking? Authorizing Provider  amLODipine (NORVASC) 5 MG tablet Take 1 tablet (5 mg total) by mouth daily. 04/07/22   Burchette, Alinda Sierras, MD  azelastine (ASTELIN) 0.1 % nasal spray Place 2 sprays into both nostrils 2 (two) times daily. Use in each nostril as directed 05/19/22   Burchette, Alinda Sierras, MD  B Complex Vitamins (B COMPLEX PO) Take by mouth.    [provider]  Biotin 2500 MCG CAPS Take by mouth.    [provider]  cetirizine (ZYRTEC) 5 MG tablet Take 5 mg by mouth as needed for allergies.    [provider]  Cholecalciferol (VITAMIN D3) 125 MCG (5000 UT) CAPS Take by mouth.    [provider]  losartan-hydrochlorothiazide (HYZAAR) 100-25 MG tablet Take 1 tablet by mouth daily. 08/01/22   Burchette, Alinda Sierras, MD  metoprolol succinate (TOPROL-XL) 50 MG 24 hr tablet TAKE ONE TABLET BY MOUTH ONE TIME DAILY WITH OR IMMEDIATELY AFTER A MEAL 03/28/22   Burchette, Alinda Sierras, MD  Multiple Vitamins-Minerals (MULTIVITAMIN WOMEN 50+ PO) Take by mouth.    [provider]  rosuvastatin (CRESTOR) 10 MG tablet Take 1 tablet (10 mg total) by mouth daily. 04/07/22   Burchette, Alinda Sierras, MD  sertraline (ZOLOFT) 50 MG tablet TAKE ONE TABLET BY MOUTH ONE TIME DAILY 03/28/22   Burchette, Alinda Sierras, MD  traMADol (ULTRAM) 50 MG tablet TAKE ONE TABLET BY MOUTH EVERY 4 HOURS AS NEEDED FOR UP TO 5 DAYS 04/07/22   Burchette, Alinda Sierras, MD       Allergies    Doxycycline, Erythromycin base, Naproxen, Prednisone, and Scallops [shellfish allergy]    Review of Systems   Review of Systems  Physical Exam Updated Vital Signs BP 122/78   Pulse 80   Temp 98.9 F (37.2 C) (Oral)   Resp 17   Ht '5\' 6"'$  (1.676 m)   Wt 81.2 kg   SpO2 97%   BMI 28.89 kg/m  Physical Exam  ED Results / Procedures / Treatments   Labs (all labs ordered are listed, but only abnormal results are displayed) Labs Reviewed  CBC WITH DIFFERENTIAL/PLATELET  COMPREHENSIVE METABOLIC PANEL  LIPASE, BLOOD  URINALYSIS, ROUTINE W REFLEX MICROSCOPIC    EKG None  Radiology No results found.  Procedures Procedures  {Document cardiac monitor, telemetry assessment procedure when appropriate:1}  Medications Ordered in ED Medications - No data to display  ED Course/ Medical Decision Making/ A&P   {   Click here for ABCD2, HEART and other calculatorsREFRESH Note before signing :1}                          Medical Decision Making  ***  {Document critical care time when appropriate:1} {Document review of labs and clinical decision tools ie heart score, Chads2Vasc2 etc:1}  {Document your independent review of radiology images, and any outside records:1} {Document your discussion with  family members, caretakers, and with consultants:1} {Document social determinants of health affecting pt's care:1} {Document your decision making why or why not admission, treatments were needed:1} Final Clinical Impression(s) / ED Diagnoses Final diagnoses:  None    Rx / DC Orders ED Discharge Orders     None

## 2022-08-29 NOTE — ED Triage Notes (Signed)
Patient presents to ED via POV from home. Here with right sided abdominal pain, nausea and diarrhea that began last night.

## 2022-08-30 ENCOUNTER — Encounter: Payer: Self-pay | Admitting: Family Medicine

## 2022-09-02 ENCOUNTER — Telehealth: Payer: Self-pay

## 2022-09-02 NOTE — Telephone Encounter (Signed)
I received a triage note in regards to the patient. Patient was seen in ED on 08/29/2022 and was prescribed Flagyl and Ciprofloxacin and stated she has been experiencing  intermittent Diarrhea for 2 days since starting Antibiotics. Patient inquired if there is anything she can do in regards to diarrhea? Patient has appointment scheduled for tomorrow for hospital follow up.

## 2022-09-02 NOTE — Telephone Encounter (Signed)
Patient informed of the message below and verbalized understanding.  

## 2022-09-03 ENCOUNTER — Encounter: Payer: Self-pay | Admitting: Family Medicine

## 2022-09-03 ENCOUNTER — Ambulatory Visit (INDEPENDENT_AMBULATORY_CARE_PROVIDER_SITE_OTHER): Payer: Medicare HMO | Admitting: Family Medicine

## 2022-09-03 VITALS — BP 118/64 | HR 70 | Temp 97.5°F | Ht 66.0 in | Wt 181.3 lb

## 2022-09-03 DIAGNOSIS — K8689 Other specified diseases of pancreas: Secondary | ICD-10-CM | POA: Diagnosis not present

## 2022-09-03 DIAGNOSIS — R1900 Intra-abdominal and pelvic swelling, mass and lump, unspecified site: Secondary | ICD-10-CM

## 2022-09-03 DIAGNOSIS — R109 Unspecified abdominal pain: Secondary | ICD-10-CM | POA: Diagnosis not present

## 2022-09-03 DIAGNOSIS — K529 Noninfective gastroenteritis and colitis, unspecified: Secondary | ICD-10-CM

## 2022-09-03 NOTE — Patient Instructions (Signed)
I will set up abdominal MRI to further assess the pancreas mass/cystic.    Follow up for any fever or recurrent abdominal pain.

## 2022-09-03 NOTE — Progress Notes (Unsigned)
Established Patient Office Visit  Subjective   Patient ID: Rebecca Reid, female    DOB: 01-04-1946  Age: 77 y.o. MRN: [id number]  Chief Complaint  Patient presents with   Hospitalization Follow-up    HPI  {History (Optional):23778} Rebecca Reid is seen following recent episode of abdominal pain with nausea and CT scan showing question of colitis.  She has prior history of cholecystectomy and apparently presented with some right mid to lower quadrant abdominal pain which started the night before she went into the ED on the ninth.  She had increased pain with certain movements.  No fever.  No vomiting.  Labs including chemistries and CBC as well as lipase were unremarkable.  Her white count was 10,000.  CT scan showed some mild wall thickening involving the splenic flexure and descending colon consistent with colitis of infectious, inflammatory, or ischemic etiology.  She denied any recent bloody stools.  There is also comment of increased size of posterior pancreatic head cyst or cystic mass measuring 5 and half centimeters with previous 2.7 cm.  Recommendation for outpatient MRI.  There is also comment that her common bile duct was 2.1 cm and previously 1.7 cm.  She states she is having some fatigue and nausea without vomiting.  She thinks a lot of this is coming from the medication especially Flagyl.  She still has some loose stools but no bloody stools.  No fever.  Abdominal pain somewhat improved.  Past Medical History:  Diagnosis Date   ALLERGIC RHINITIS CAUSE UNSPECIFIED 04/30/2009   Anxiety    DEPRESSION, HX OF 04/11/2010   Dysuria 05/06/2010   Gallstones    GERD 04/30/2009   HYPERLIPIDEMIA 04/30/2009   HYPERTENSION 04/30/2009   LEG EDEMA 12/12/2009   OSTEOPENIA 04/30/2009   PVC (premature ventricular contraction)    Status post dilation of esophageal narrowing    Past Surgical History:  Procedure Laterality Date   ABDOMINAL HYSTERECTOMY  1986   AUGMENTATION MAMMAPLASTY  Bilateral    BREAST ENHANCEMENT SURGERY  1987   BREAST SURGERY  1974   biopsy   CARDIAC CATHETERIZATION N/A 11/02/2015   Procedure: Left Heart Cath and Coronary Angiography;  Surgeon: Belva Crome, MD;  Location: [facility];  Service: Cardiovascular;  Laterality: N/A;   Carpel tunel Right    CATARACT EXTRACTION, BILATERAL Bilateral    one in feb and one in March Dr. Tommy Rainwater   CHOLECYSTECTOMY  1974   ectopic pregnancy     OTHER SURGICAL HISTORY     Stomach was not developed at birth    reports that she has never smoked. She has never used smokeless tobacco. She reports current alcohol use. She reports that she does not use drugs. family history includes Arthritis in her mother; Emphysema in her mother; Heart disease (age of onset: 19) in her father; Heart disease (age of onset: 18) in her mother; Hyperlipidemia in her father and mother; Hypertension in her father and mother; Prostate cancer in her father; Skin cancer in her mother. Allergies  Allergen Reactions   Doxycycline Nausea And Vomiting   Erythromycin Base Other (See Comments)    yeast infection, GI upset   Naproxen Other (See Comments)    GI upset   Prednisone Hives    REACTION: hives, anxiety, insomnia   Scallops [Shellfish Allergy] Nausea And Vomiting    Review of Systems  Constitutional:  Negative for chills and fever.  Respiratory:  Negative for cough.   Cardiovascular:  Negative for chest pain.  Gastrointestinal:  Positive for diarrhea and nausea. Negative for blood in stool, constipation and vomiting.  Genitourinary:  Negative for dysuria.      Objective:     BP 118/64 (BP Location: Left Arm, Patient Position: Sitting, Cuff Size: Large)   Pulse 70   Temp (!) 97.5 F (36.4 C) (Oral)   Ht 5' 6"$  (1.676 m)   Wt 181 lb 4.8 oz (82.2 kg)   SpO2 99%   BMI 29.26 kg/m  {Vitals History (Optional):23777}  Physical Exam Vitals reviewed.  Constitutional:      General: She is not in acute distress.     Appearance: Normal appearance.  Cardiovascular:     Rate and Rhythm: Normal rate and regular rhythm.  Pulmonary:     Effort: Pulmonary effort is normal.     Breath sounds: Normal breath sounds.  Abdominal:     General: Bowel sounds are normal. There is no distension.     Palpations: Abdomen is soft.     Tenderness: There is no abdominal tenderness. There is no guarding or rebound.  Neurological:     Mental Status: She is alert.      No results found for any visits on 09/03/22.  {Labs (Optional):23779}  The 10-year ASCVD risk score (Arnett DK, et al., 2019) is: 17.7%    Assessment & Plan:   Recent abdominal pain with question of descending: Colitis.  She actually had pain on the right lower abdominal region.  She was treated nonetheless with antibiotics with Flagyl and Cipro and is having increased nausea and aftertaste which is likely related to Flagyl.  She finishes her last dose today.  -She also had increased pancreas cyst/mass as above with recommended outpatient MRI.  Will set this up for further evaluation.  Also her common bile duct was slightly increased 2.1 cm versus 1.7 cm previously.  Recent liver enzymes were normal.  Rebecca Littler, MD

## 2022-09-05 ENCOUNTER — Ambulatory Visit: Payer: Medicare HMO | Admitting: Podiatry

## 2022-09-25 ENCOUNTER — Other Ambulatory Visit: Payer: Self-pay

## 2022-09-25 ENCOUNTER — Telehealth (INDEPENDENT_AMBULATORY_CARE_PROVIDER_SITE_OTHER): Payer: Medicare HMO | Admitting: Family Medicine

## 2022-09-25 ENCOUNTER — Ambulatory Visit
Admission: RE | Admit: 2022-09-25 | Discharge: 2022-09-25 | Disposition: A | Discharge status: Home / Self Care | Payer: Medicare HMO | Source: Ambulatory Visit | Attending: Family Medicine | Admitting: Family Medicine

## 2022-09-25 DIAGNOSIS — Z0000 Encounter for general adult medical examination without abnormal findings: Secondary | ICD-10-CM

## 2022-09-25 DIAGNOSIS — K838 Other specified diseases of biliary tract: Secondary | ICD-10-CM | POA: Diagnosis not present

## 2022-09-25 DIAGNOSIS — K862 Cyst of pancreas: Secondary | ICD-10-CM | POA: Diagnosis not present

## 2022-09-25 DIAGNOSIS — D3502 Benign neoplasm of left adrenal gland: Secondary | ICD-10-CM | POA: Diagnosis not present

## 2022-09-25 DIAGNOSIS — R1900 Intra-abdominal and pelvic swelling, mass and lump, unspecified site: Secondary | ICD-10-CM

## 2022-09-25 DIAGNOSIS — Z8719 Personal history of other diseases of the digestive system: Secondary | ICD-10-CM

## 2022-09-25 MED ORDER — GADOPICLENOL 0.5 MMOL/ML IV SOLN
8.0000 mL | Freq: Once | INTRAVENOUS | Status: AC | PRN
  Administered 2022-09-25: 8 mL via INTRAVENOUS

## 2022-09-25 NOTE — Patient Instructions (Addendum)
I really enjoyed getting to talk with you today! I am available on Tuesdays and Thursdays for virtual visits if you have any questions or concerns, or if I can be of any further assistance.   CHECKLIST FROM ANNUAL WELLNESS VISIT:  -Follow up (please call to schedule if not scheduled after visit):   -yearly for annual wellness visit with primary care office  Here is a list of your preventive care/health maintenance measures and the plan for each if any are due:  Health Maintenance  Topic Date Due   Fecal DNA (Cologuard)  06/27/2022   Medicare Annual Wellness (AWV)  09/25/2023   DTaP/Tdap/Td (4 - Td or Tdap) 03/30/2029   Pneumonia Vaccine 97+ Years old  Completed   INFLUENZA VACCINE  Completed   DEXA SCAN  Completed   COVID-19 Vaccine  Completed   Hepatitis C Screening  Completed   Zoster Vaccines- Shingrix  Completed   HPV VACCINES  Aged Out    -See a dentist at least yearly  -Get your eyes checked and then per your eye specialist's recommendations  -Other issues addressed today: -mammograms: different guidelines say different things - some say stop at age 52, some say to continue if health is goo (life expectancy potentially > 10 years)  -I have included below further information regarding a healthy whole foods based diet, physical activity guidelines for adults, stress management and opportunities for social connections. I hope you find this information useful.   -----------------------------------------------------------------------------------------------------------------------------------------------------------------------------------------------------------------------------------------------------------  NUTRITION: -eat real food: lots of colorful vegetables (half the plate) and fruits -5-7 servings of vegetables and fruits per day (fresh or steamed is best), exp. 2 servings of vegetables with lunch and dinner and 2 servings of fruit per day. Berries and greens such as  kale and collards are great choices.  -consume on a regular basis: whole grains (make sure first ingredient on label contains the word "whole"), fresh fruits, fish, nuts, seeds, healthy oils (such as olive oil, avocado oil, grape seed oil) -may eat small amounts of dairy and lean meat on occasion, but avoid processed meats such as ham, bacon, lunch meat, etc. -drink water -try to avoid fast food and pre-packaged foods, processed meat -most experts advise limiting sodium to < '2300mg'$  per day, should limit further is any chronic conditions such as high blood pressure, heart disease, diabetes, etc. The American Heart Association advised that < '1500mg'$  is is ideal -try to avoid foods that contain any ingredients with names you do not recognize  -try to avoid sugar/sweets (except for the natural sugar that occurs in fresh fruit) -try to avoid sweet drinks -try to avoid white rice, white bread, pasta (unless whole grain), white or yellow potatoes  EXERCISE GUIDELINES FOR ADULTS: -if you wish to increase your physical activity, do so gradually and with the approval of your doctor -STOP and seek medical care immediately if you have any chest pain, chest discomfort or trouble breathing when starting or increasing exercise  -move and stretch your body, legs, feet and arms when sitting for long periods -Physical activity guidelines for optimal health in adults: -least 150 minutes per week of aerobic exercise (can talk, but not sing) once approved by your doctor, 20-30 minutes of sustained activity or two 10 minute episodes of sustained activity every day.  -resistance training at least 2 days per week if approved by your doctor -balance exercises 3+ days per week:   Stand somewhere where you have something sturdy to hold onto if you lose balance.  1) lift up on toes, start with 5x per day and work up to 20x   2) stand and lift on leg straight out to the side so that foot is a few inches of the floor, start  with 5x each side and work up to 20x each side   3) stand on one foot, start with 5 seconds each side and work up to 20 seconds on each side  If you need ideas or help with getting more active:  -Silver sneakers https://tools.silversneakers.com  -Walk with a Doc: http://stephens-thompson.biz/  -try to include resistance (weight lifting/strength building) and balance exercises twice per week: or the following link for ideas: ChessContest.fr  UpdateClothing.com.cy  STRESS MANAGEMENT: -can try meditating, or just sitting quietly with deep breathing while intentionally relaxing all parts of your body for 5 minutes daily -if you need further help with stress, anxiety or depression please follow up with your primary doctor or contact the wonderful folks at [facility]: [place]: -options in [place] if you wish to engage in more social and exercise related activities:  -Silver sneakers https://tools.silversneakers.com  -Walk with a Doc: http://stephens-thompson.biz/  -Check out the [place] 50+ section on the [place] [employer] (hiking clubs, book clubs, cards and games, chess, exercise classes, aquatic classes and much more) - see the website for details: https://www.[place].gov/departments/parks-recreation/active-adults50  -YouTube has lots of exercise videos for different ages and abilities as well  -[place] (a variety of indoor and outdoor inperson activities for adults). [phone]. [address].  -Virtual Online Classes (a variety of topics): see seniorplanet.org or call [phone]  -consider volunteering at a school, hospice center, church, senior center or elsewhere

## 2022-09-25 NOTE — Progress Notes (Signed)
PATIENT CHECK-IN and HEALTH RISK ASSESSMENT QUESTIONNAIRE:  -completed by phone/video for upcoming Medicare Preventive Visit  Pre-Visit Check-in: 1)Vitals (height, wt, BP, etc) - record in vitals section for visit on day of visit 2)Review and Update Medications, Allergies PMH, Surgeries, Social history in Epic 3)Hospitalizations in the last year with date/reason? No   4)Review and Update Care Team (patient's specialists) in Epic 5) Complete PHQ9 in Epic  6) Complete Fall Screening in Epic 7)Review all Health Maintenance Due and order under PCP if not done.  8)Medicare Wellness Questionnaire: Answer theses question about your habits: Do you drink alcohol? Yes  If yes, how many drinks do you have a day? only a few times per year Have you ever smoked?No  Quit date if applicable?  No  How many packs a day do/did you smoke? No  Do you use smokeless tobacco? No Do you use an illicit drugs?No  Do you exercises? No, but reports is super active around the house - always on her feet Are you sexually active? No Number of partners?No Typical breakfast: [place] sausage, muffin, toast or english muffin Typical lunch: yogurt dairy free Typical dinner: fish,chicken pork, with vegetable Typical snacks:fruit  Beverages: coffee, decaf tea, water  Answer theses question about you: Can you perform most household chores?Yes Do you find it hard to follow a conversation in a noisy room?Yes, have noticed a change in hearing Do you often ask people to speak up or repeat themselves? Yes, to have people speak up Do you feel that you have a problem with memory?No  Do you balance your checkbook and or bank acounts?Yes Do you feel safe at home?Yes Last dentist visit?4 months ago  Do you need assistance with any of the following: Please note if so No assistance needed with below  Driving?  Feeding yourself?  Getting from bed to chair?  Getting to the toilet?  Bathing or showering?  Dressing  yourself?  Managing money?  Climbing a flight of stairs  Preparing meals?  Do you have Advanced Directives in place (Living Will, Healthcare Power or Attorney)? Yes   Last eye Exam and location?09/29/22, [facility]   Do you currently use prescribed or non-prescribed narcotic or opioid pain medications?Yes, Tramadol only   Do you have a history or close family history of breast, ovarian, tubal or peritoneal cancer or a family member with BRCA (breast cancer susceptibility 1 and 2) gene mutations?  Nurse/Assistant Credentials/time stamp: St    ----------------------------------------------------------------------------------------------------------------------------------------------------------------------------------------------------------------------   MEDICARE ANNUAL PREVENTIVE VISIT WITH PROVIDER: (Welcome to [employer], initial annual wellness or annual wellness exam)  Virtual Visit via Video Note  I connected with BRIANNAH BRENDEN on 09/25/22 by a video enabled telemedicine application and verified that I am speaking with the correct person using two identifiers.  Location patient: home Location provider:work or home office Persons participating in the virtual visit: patient, provider  Concerns and/or follow up today: reports stable, getting MRI ordered recently - but feeling well.    See HM section in Epic for other details of completed HM.    ROS: negative for report of fevers, unintentional weight loss, vision changes, vision loss, hearing loss or change, chest pain, sob, hemoptysis, melena, hematochezia, hematuria, falls, bleeding or bruising, loc, thoughts of suicide or self harm, memory loss  Patient-completed extensive health risk assessment - reviewed and discussed with the patient: See Health Risk Assessment completed with patient prior to the visit either above or in recent phone note. This was  reviewed in detailed with the patient today and  appropriate recommendations, orders and referrals were placed as needed per Summary below and patient instructions.   Review of Medical History: -PMH, PSH, Family History and current specialty and care providers reviewed and updated and listed below   Patient Care Team: [name], MD as PCP - General Viona Gilmore, [facility] (Inactive) as Pharmacist (Pharmacist)   Past Medical History:  Diagnosis Date   ALLERGIC RHINITIS CAUSE UNSPECIFIED 04/30/2009   Anxiety    DEPRESSION, HX OF 04/11/2010   Dysuria 05/06/2010   Gallstones    GERD 04/30/2009   HYPERLIPIDEMIA 04/30/2009   HYPERTENSION 04/30/2009   LEG EDEMA 12/12/2009   OSTEOPENIA 04/30/2009   PVC (premature ventricular contraction)    Status post dilation of esophageal narrowing     Past Surgical History:  Procedure Laterality Date   ABDOMINAL HYSTERECTOMY  1986   AUGMENTATION MAMMAPLASTY Bilateral    BREAST ENHANCEMENT [place]   biopsy   CARDIAC CATHETERIZATION N/A 11/02/2015   Procedure: Left Heart Cath and Coronary Angiography;  Surgeon: Belva Crome, MD;  Location: [place] [facility];  Service: Cardiovascular;  Laterality: N/A;   Carpel tunel Right    CATARACT EXTRACTION, BILATERAL Bilateral    one in feb and one in March Dr. Tommy Rainwater   CHOLECYSTECTOMY  1974   ectopic pregnancy     OTHER SURGICAL HISTORY     Stomach was not developed at birth    Social History   Socioeconomic History   Marital status: Married    Spouse name: Not on file   Number of children: 3   Years of education: 12    Highest education level: Some college, no degree  Occupational History   Occupation: retired  Tobacco Use   Smoking status: Never   Smokeless tobacco: Never  Vaping Use   Vaping Use: Never used  Substance and Sexual Activity   Alcohol use: Yes    Comment: social   Drug use: Never   Sexual activity: Not on file  Other Topics Concern   Not on file  Social History Narrative   Married    [place] 2   Retired    Science writer Determinants of Ruch Strain: Unknown (09/02/2022)   Overall Financial Resource Strain (CARDIA)    Difficulty of Paying Living Expenses: Patient refused  Food Insecurity: Unknown (09/02/2022)   Hunger Vital Sign    Worried About Running Out of Food in the Last Year: Patient refused    [employer] in the Last Year: Patient refused  Transportation Needs: Unknown (09/02/2022)   PRAPARE - Transportation    Lack of Transportation (Medical): Patient refused    Lack of Transportation (Non-Medical): Patient refused  Physical Activity: Unknown (09/02/2022)   Exercise Vital Sign    Days of Exercise per Week: Patient refused    Minutes of Exercise per Session: Not on file  Stress: Unknown (09/02/2022)   [employer] [place] of Stress : Patient refused  Social Connections: Unknown (09/02/2022)   Social Connection and Isolation Panel [NHANES]    Frequency of Communication with Friends and Family: Patient refused    Frequency of Social Gatherings with Friends and Family: Patient refused    Attends Religious Services: Patient refused    Active Member of Clubs or Organizations: Patient refused    Attends Archivist Meetings: Not  on file    Marital Status: Patient refused  Intimate Partner Violence: Not At Risk (09/23/2021)   Humiliation, Afraid, Rape, and Kick questionnaire    Fear of Current or Ex-Partner: No    Emotionally Abused: No    Physically Abused: No    Sexually Abused: No    Family History  Problem Relation Age of Onset   Emphysema Mother        heavy smoker   Heart disease Mother 72   Hyperlipidemia Mother    Hypertension Mother    Skin cancer Mother        melanomia, spread to her spine   Arthritis Mother    Heart disease Father 78   Prostate cancer Father    Hyperlipidemia Father    Hypertension Father    Colon cancer Neg Hx    Esophageal  cancer Neg Hx    Rectal cancer Neg Hx    Breast cancer Neg Hx     Current Outpatient Medications on File Prior to Visit  Medication Sig Dispense Refill   amLODipine (NORVASC) 5 MG tablet Take 1 tablet (5 mg total) by mouth daily. 90 tablet 3   azelastine (ASTELIN) 0.1 % nasal spray Place 2 sprays into both nostrils 2 (two) times daily. Use in each nostril as directed 30 mL 1   B Complex Vitamins (B COMPLEX PO) Take by mouth.     Biotin 2500 MCG CAPS Take by mouth.     cetirizine (ZYRTEC) 5 MG tablet Take 5 mg by mouth as needed for allergies.     Cholecalciferol (VITAMIN D3) 125 MCG (5000 UT) CAPS Take by mouth.     ciprofloxacin (CIPRO) 500 MG tablet Take 1 tablet (500 mg total) by mouth every 12 (twelve) hours. 10 tablet 0   losartan-hydrochlorothiazide (HYZAAR) 100-25 MG tablet Take 1 tablet by mouth daily. 90 tablet 0   metoprolol succinate (TOPROL-XL) 50 MG 24 hr tablet TAKE ONE TABLET BY MOUTH ONE TIME DAILY WITH OR IMMEDIATELY AFTER A MEAL 90 tablet 1   metroNIDAZOLE (FLAGYL) 500 MG tablet Take 1 tablet (500 mg total) by mouth 2 (two) times daily. 10 tablet 0   Multiple Vitamins-Minerals (MULTIVITAMIN WOMEN 50+ PO) Take by mouth.     ondansetron (ZOFRAN-ODT) 4 MG disintegrating tablet Take 1 tablet (4 mg total) by mouth every 8 (eight) hours as needed for nausea or vomiting. 20 tablet 0   oxyCODONE (ROXICODONE) 5 MG immediate release tablet Take 1 tablet (5 mg total) by mouth every 4 (four) hours as needed for severe pain. 12 tablet 0   rosuvastatin (CRESTOR) 10 MG tablet Take 1 tablet (10 mg total) by mouth daily. 90 tablet 3   sertraline (ZOLOFT) 50 MG tablet TAKE ONE TABLET BY MOUTH ONE TIME DAILY 90 tablet 3   traMADol (ULTRAM) 50 MG tablet TAKE ONE TABLET BY MOUTH EVERY 4 HOURS AS NEEDED FOR UP TO 5 DAYS 60 tablet 1   No current facility-administered medications on file prior to visit.    Allergies  Allergen Reactions   Doxycycline Nausea And Vomiting   Erythromycin Base  Other (See Comments)    yeast infection, GI upset   Naproxen Other (See Comments)    GI upset   Prednisone Hives    REACTION: hives, anxiety, insomnia   Scallops [Shellfish Allergy] Nausea And Vomiting       Physical Exam There were no vitals filed for this visit. Estimated body mass index is 29.26 kg/m as calculated from the  following:   Height as of 09/03/22: '5\' 6"'$  (1.676 m).   Weight as of 09/03/22: 181 lb 4.8 oz (82.2 kg).  EKG (optional): deferred due to virtual visit  GENERAL: alert, oriented, no acute distress detected, full vision exam deferred due to pandemic and/or virtual encounter  HEENT: atraumatic, conjunttiva clear, no obvious abnormalities on inspection of external nose and ears  NECK: normal movements of the head and neck  LUNGS: on inspection no signs of respiratory distress, breathing rate appears normal, no obvious gross SOB, gasping or wheezing  CV: no obvious cyanosis  MS: moves all visible extremities without noticeable abnormality  PSYCH/NEURO: pleasant and cooperative, no obvious depression or anxiety, speech and thought processing grossly intact, Cognitive function grossly intact  Flowsheet Row Video Visit from 09/25/2022 in [place] at [place]  PHQ-9 Total Score 1           09/25/2022    3:04 PM 06/06/2022    4:42 PM 04/25/2022    3:03 PM 09/23/2021    3:34 PM 08/07/2021   11:39 AM  Depression screen PHQ 2/9  Decreased Interest 0 0 0 0 0  Down, Depressed, Hopeless 0 0 0 0 0  PHQ - 2 Score 0 0 0 0 0  Altered sleeping 1 0     Tired, decreased energy 0 0     Change in appetite 0 0     Feeling bad or failure about yourself  0 0     Trouble concentrating 0 0     Moving slowly or fidgety/restless 0 0     Suicidal thoughts 0 0     PHQ-9 Score 1 0     Difficult doing work/chores Not difficult at all           09/23/2021    3:37 PM 04/25/2022    3:01 PM 06/06/2022    4:42 PM 09/02/2022   11:21 AM 09/25/2022    3:04 PM   [place] in the past year? 0 1 1 0 1  Was there an injury with Fall? 0 0 0  0  Fall Risk Category Calculator 0 '2 1  1  '$ Fall Risk Category (Retired) Low Moderate Low    (RETIRED) Patient Fall Risk Level Low fall risk Low fall risk Low fall risk    Patient at Risk for Falls Due to No Fall Risks No Fall Risks Other (Comment)    Fall risk Follow up  Falls evaluation completed Falls evaluation completed       SUMMARY AND PLAN:  Encounter for Medicare annual wellness exam    Discussed applicable health maintenance/preventive health measures and advised and referred or ordered per patient preferences:  Health Maintenance  Topic Date Due   Fecal DNA (Cologuard)  06/27/2022, discussed, sent message to staff to order for patient   Medicare Annual Wellness (AWV)  09/25/2023   DTaP/Tdap/Td (4 - Td or Tdap) 03/30/2029   Pneumonia Vaccine 22+ Years old  Completed   INFLUENZA VACCINE  Completed   DEXA SCAN  Completed   COVID-19 Vaccine  Completed   Hepatitis C Screening  Completed   Zoster Vaccines- Shingrix  Completed   HPV VACCINES  Aged Out  It appear she also is due for her mammogram given her baseline health is fairly good and she may have > 10 years of life expectancy - though nobody can predict this - she plans to call to schedule.  Education and counseling on the following  was provided based on the above review of health and a plan/checklist for the patient, along with additional information discussed, was provided for the patient in the patient instructions :   -Advised and counseled on a whole foods based healthy diet and regular exercise: discussed a heart healthy whole foods based diet at length. A summary of a healthy diet was provided in the Patient Instructions.Recommended regular exercise and discussed exercise guidelines and options for at home and within the community.  -Advised yearly dental visits at minimum and regular eye exams   Follow up: see patient  instructions     Patient Instructions  I really enjoyed getting to talk with you today! I am available on Tuesdays and Thursdays for virtual visits if you have any questions or concerns, or if I can be of any further assistance.   CHECKLIST FROM ANNUAL WELLNESS VISIT:  -Follow up (please call to schedule if not scheduled after visit):   -yearly for annual wellness visit with primary care office  Here is a list of your preventive care/health maintenance measures and the plan for each if any are due:  Health Maintenance  Topic Date Due   Fecal DNA (Cologuard)  06/27/2022   Medicare Annual Wellness (AWV)  09/25/2023   DTaP/Tdap/Td (4 - Td or Tdap) 03/30/2029   Pneumonia Vaccine 18+ Years old  Completed   INFLUENZA VACCINE  Completed   DEXA SCAN  Completed   COVID-19 Vaccine  Completed   Hepatitis C Screening  Completed   Zoster Vaccines- Shingrix  Completed   HPV VACCINES  Aged Out    -See a dentist at least yearly  -Get your eyes checked and then per your eye specialist's recommendations  -Other issues addressed today: -mammograms: different guidelines say different things - some say stop at age 51, some say to continue if health is goo (life expectancy potentially > 10 years)  -I have included below further information regarding a healthy whole foods based diet, physical activity guidelines for adults, stress management and opportunities for social connections. I hope you find this information useful.   -----------------------------------------------------------------------------------------------------------------------------------------------------------------------------------------------------------------------------------------------------------  NUTRITION: -eat real food: lots of colorful vegetables (half the plate) and fruits -5-7 servings of vegetables and fruits per day (fresh or steamed is best), exp. 2 servings of vegetables with lunch and dinner and 2 servings of  fruit per day. Berries and greens such as kale and collards are great choices.  -consume on a regular basis: whole grains (make sure first ingredient on label contains the word "whole"), fresh fruits, fish, nuts, seeds, healthy oils (such as olive oil, avocado oil, grape seed oil) -may eat small amounts of dairy and lean meat on occasion, but avoid processed meats such as ham, bacon, lunch meat, etc. -drink water -try to avoid fast food and pre-packaged foods, processed meat -most experts advise limiting sodium to < '2300mg'$  per day, should limit further is any chronic conditions such as high blood pressure, heart disease, diabetes, etc. The American Heart Association advised that < '1500mg'$  is is ideal -try to avoid foods that contain any ingredients with names you do not recognize  -try to avoid sugar/sweets (except for the natural sugar that occurs in fresh fruit) -try to avoid sweet drinks -try to avoid white rice, white bread, pasta (unless whole grain), white or yellow potatoes  EXERCISE GUIDELINES FOR ADULTS: -if you wish to increase your physical activity, do so gradually and with the approval of your doctor -STOP and seek medical care immediately if  you have any chest pain, chest discomfort or trouble breathing when starting or increasing exercise  -move and stretch your body, legs, feet and arms when sitting for long periods -Physical activity guidelines for optimal health in adults: -least 150 minutes per week of aerobic exercise (can talk, but not sing) once approved by your doctor, 20-30 minutes of sustained activity or two 10 minute episodes of sustained activity every day.  -resistance training at least 2 days per week if approved by your doctor -balance exercises 3+ days per week:   Stand somewhere where you have something sturdy to hold onto if you lose balance.    1) lift up on toes, start with 5x per day and work up to 20x   2) stand and lift on leg straight out to the side so  that foot is a few inches of the floor, start with 5x each side and work up to 20x each side   3) stand on one foot, start with 5 seconds each side and work up to 20 seconds on each side  If you need ideas or help with getting more active:  -Silver sneakers https://tools.silversneakers.com  -Walk with a Doc: http://stephens-thompson.biz/  -try to include resistance (weight lifting/strength building) and balance exercises twice per week: or the following link for ideas: ChessContest.fr  UpdateClothing.com.cy  STRESS MANAGEMENT: -can try meditating, or just sitting quietly with deep breathing while intentionally relaxing all parts of your body for 5 minutes daily -if you need further help with stress, anxiety or depression please follow up with your primary doctor or contact the wonderful folks at [facility]: [place]: -options in [place] if you wish to engage in more social and exercise related activities:  -Silver sneakers https://tools.silversneakers.com  -Walk with a Doc: http://stephens-thompson.biz/  -Check out the [place] 50+ section on the [place] [employer] (hiking clubs, book clubs, cards and games, chess, exercise classes, aquatic classes and much more) - see the website for details: https://www.[place].gov/departments/parks-recreation/active-adults50  -YouTube has lots of exercise videos for different ages and abilities as well  -[place] (a variety of indoor and outdoor inperson activities for adults). (858) 159-2723. [address]  -Virtual Online Classes (a variety of topics): see seniorplanet.org or call 646-839-2838  -consider volunteering at a school, hospice center, church, senior center or elsewhere           Lucretia Kern, DO

## 2022-09-27 ENCOUNTER — Other Ambulatory Visit: Payer: Self-pay | Admitting: Family Medicine

## 2022-09-27 DIAGNOSIS — K862 Cyst of pancreas: Secondary | ICD-10-CM

## 2022-09-29 ENCOUNTER — Telehealth: Payer: Self-pay | Admitting: Gastroenterology

## 2022-09-29 DIAGNOSIS — H524 Presbyopia: Secondary | ICD-10-CM | POA: Diagnosis not present

## 2022-09-29 NOTE — Telephone Encounter (Signed)
Rebecca Reid ,  Thank you for the note.  While this does need evaluation and is a progressive finding from a 2015 MRI, it is not urgent.  LFTs also normal  It may require ERCP and possible EUS, so I will also copy this to Dr. Rush Landmark for his opinion.  If he agrees that one or both of those procedures is indicated, it may be best for her to see him in the office instead of me.  HD

## 2022-09-29 NOTE — Telephone Encounter (Signed)
HI Dr. Loletha Carrow,  We received this referral for this patient for a possible ERCP.  Patient has a 5.7 cm cystic lesion pancreas head by recent MRI scan.  Last saw you in 2019.  Please advise urgency and scheduling?

## 2022-09-30 NOTE — Telephone Encounter (Signed)
HD, I have no availability until June for clinic as I am already overbooked every single day that I have clinic. It may be worth seeing her in clinic. I am okay with moving forward with EUS but no ERCP at this time. If she is interested in the EUS with possible aspiration, we can schedule her but it will be scheduled in May most likely based on availability. Let me know what you would like to do. Thanks. GM

## 2022-09-30 NOTE — Telephone Encounter (Signed)
Patty,  Please get her a next available clinic appointment with me, at which time I will have a discussion with her about an EUS with Dr. Rush Landmark.   - HD

## 2022-10-01 ENCOUNTER — Encounter: Payer: Self-pay | Admitting: Family Medicine

## 2022-10-01 NOTE — Telephone Encounter (Signed)
Appt has been made to see Dr Loletha Carrow in May.  She will call back to see if we have any cancellations and we will also put her on the waitlist.  FYI Dr Loletha Carrow

## 2022-10-02 ENCOUNTER — Other Ambulatory Visit: Payer: Self-pay | Admitting: Family Medicine

## 2022-10-02 NOTE — Telephone Encounter (Signed)
Called and spoke with patient. Appt with Dr. Loletha Carrow has been rescheduled to Thursday, 10/23/22 at 11 am. Pt is aware that her appt information will update in [place]. Pt verbalized understanding and had no concerns.

## 2022-10-02 NOTE — Telephone Encounter (Signed)
[place] see the message from Dr Loletha Carrow

## 2022-10-02 NOTE — Telephone Encounter (Signed)
Thank you.  I would like to see her sooner, and have openings Thursday April 4th and Tues April 9th.  - HD

## 2022-10-03 NOTE — Telephone Encounter (Signed)
Noted.  That sounds good!  [name] MD [facility]

## 2022-10-13 ENCOUNTER — Encounter: Payer: Self-pay | Admitting: Family Medicine

## 2022-10-15 ENCOUNTER — Other Ambulatory Visit: Payer: Self-pay | Admitting: Family Medicine

## 2022-10-18 ENCOUNTER — Encounter: Payer: Self-pay | Admitting: Family Medicine

## 2022-10-18 DIAGNOSIS — Z1211 Encounter for screening for malignant neoplasm of colon: Secondary | ICD-10-CM

## 2022-10-20 NOTE — Telephone Encounter (Signed)
Called and spoke to someone at [employer].  They stated that the order has not yet been sent.  A new order has been placed.

## 2022-10-21 DIAGNOSIS — H02831 Dermatochalasis of right upper eyelid: Secondary | ICD-10-CM | POA: Diagnosis not present

## 2022-10-21 DIAGNOSIS — Z961 Presence of intraocular lens: Secondary | ICD-10-CM | POA: Diagnosis not present

## 2022-10-21 DIAGNOSIS — H26492 Other secondary cataract, left eye: Secondary | ICD-10-CM | POA: Diagnosis not present

## 2022-10-21 DIAGNOSIS — H18413 Arcus senilis, bilateral: Secondary | ICD-10-CM | POA: Diagnosis not present

## 2022-10-23 ENCOUNTER — Ambulatory Visit: Payer: Medicare HMO | Admitting: Gastroenterology

## 2022-10-23 ENCOUNTER — Telehealth: Payer: Self-pay

## 2022-10-23 ENCOUNTER — Encounter: Payer: Self-pay | Admitting: Gastroenterology

## 2022-10-23 VITALS — BP 138/82 | HR 82 | Ht 66.0 in | Wt 176.8 lb

## 2022-10-23 DIAGNOSIS — K862 Cyst of pancreas: Secondary | ICD-10-CM

## 2022-10-23 DIAGNOSIS — R933 Abnormal findings on diagnostic imaging of other parts of digestive tract: Secondary | ICD-10-CM

## 2022-10-23 DIAGNOSIS — K219 Gastro-esophageal reflux disease without esophagitis: Secondary | ICD-10-CM | POA: Diagnosis not present

## 2022-10-23 NOTE — Telephone Encounter (Signed)
Please schedule pt for a EUS with [facility]

## 2022-10-23 NOTE — Patient Instructions (Addendum)
We will contact you to get you scheduled for your EUS.  _______________________________________________________  If your blood pressure at your visit was 140/90 or greater, please contact your primary care physician to follow up on this.  _______________________________________________________  If you are age 76 or older, your body mass index should be between 23-30. Your Body mass index is 28.54 kg/m. If this is out of the aforementioned range listed, please consider follow up with your Primary Care Provider.  If you are age 34 or younger, your body mass index should be between 19-25. Your Body mass index is 28.54 kg/m. If this is out of the aformentioned range listed, please consider follow up with your Primary Care Provider.   ________________________________________________________  The [facility] providers would like to encourage you to use [facility] to communicate with providers for non-urgent requests or questions.  Due to long hold times on the telephone, sending your provider a message by [facility] may be a faster and more efficient way to get a response.  Please allow 48 business hours for a response.  Please remember that this is for non-urgent requests.  _______________________________________________________ It was a pleasure to see you today!  Thank you for trusting me with your gastrointestinal care!

## 2022-10-23 NOTE — Telephone Encounter (Signed)
Dr Loletha Carrow will need to send his note to Dr Rush Landmark to review. Dr Rush Landmark will let me know what to schedule for the pt.  Thank you

## 2022-10-23 NOTE — Progress Notes (Signed)
[facility] Consult Note:  History: Rebecca Reid 10/23/2022  Referring provider: [name], MD  Reason for consult/chief complaint: 5.7 cm cystic lesion pancreas head (Recent MRI - discuss EUS/MRCP), Gastroesophageal Reflux (Using omeprazole - has had some breakthrough), Bloated, and Dysphagia (Occ with water and with pills - had dilation in the past)   Subjective  HPI:  Rebecca Reid sees me for the first time since her June 2019 upper endoscopy done for reflux and throat discomfort.  Findings were reviewed and report on file.  She still has reflux symptoms with burning throat discomfort and intermittent regurgitation.  She is still suffers from allergies and postnasal drip, particular this time of year.  She has not found Zyrtec very helpful.  Since I think that may have some to do with her throat discomfort as well as the chronic dry cough she has recently had including during today's visit, I recommend she communicate with primary care about the possibility of trying montelukast.  [name] still gets some right or left upper quadrant abdominal bloating after meals.  She was concerned particularly about the episode that occurred bring her to the ED in February.  She had the acute onset of a right-sided abdominal or flank pain that radiated downward and intensified over several hours.  It improved within a day after that ED visit and has not recurred.  She was concerned that there was reported inflammation or "colitis" for which she received ciprofloxacin and metronidazole.  Naturally, she is also concerned about the pancreatic cystic lesion seen with the management should be.  ROS:  Review of Systems  Constitutional:  Negative for appetite change and unexpected weight change.  HENT:  Negative for mouth sores and voice change.   Eyes:  Negative for pain and redness.  Respiratory:  Positive for cough. Negative for shortness of breath.   Cardiovascular:  Negative for  chest pain and palpitations.  Genitourinary:  Negative for dysuria and hematuria.  Musculoskeletal:  Positive for arthralgias. Negative for myalgias.  Skin:  Negative for pallor and rash.  Allergic/Immunologic: Positive for environmental allergies.  Neurological:  Negative for weakness and headaches.  Hematological:  Negative for adenopathy.     Past Medical History: Past Medical History:  Diagnosis Date   ALLERGIC RHINITIS CAUSE UNSPECIFIED 04/30/2009   Anxiety    Arthritis    DEPRESSION, HX OF 04/11/2010   Dysuria 05/06/2010   Gallstones    GERD 04/30/2009   HYPERLIPIDEMIA 04/30/2009   HYPERTENSION 04/30/2009   Lactose intolerance    LEG EDEMA 12/12/2009   OSTEOPENIA 04/30/2009   PVC (premature ventricular contraction)    Status post dilation of esophageal narrowing      Past Surgical History: Past Surgical History:  Procedure Laterality Date   ABDOMINAL HYSTERECTOMY  1986   AUGMENTATION MAMMAPLASTY Bilateral    BREAST ENHANCEMENT SURGERY  1987   BREAST SURGERY  1974   biopsy   CARDIAC CATHETERIZATION N/A 11/02/2015   Procedure: Left Heart Cath and Coronary Angiography;  Surgeon: Belva Crome, MD;  Location: [facility];  Service: Cardiovascular;  Laterality: N/A;   Carpel tunel Right    CATARACT EXTRACTION, BILATERAL Bilateral    one in feb and one in March Dr. Tommy Rainwater   CHOLECYSTECTOMY  1974   ectopic pregnancy     finger surgery  2019   Dupuytren's contracture surgery   OTHER SURGICAL HISTORY     Stomach was not developed at birth     Family History:  Family History  Problem Relation Age of Onset   Emphysema Mother        heavy smoker   Heart disease Mother 45   Hyperlipidemia Mother    Hypertension Mother    Skin cancer Mother        melanomia, spread to her spine   Arthritis Mother    Kidney cancer Mother    Heart disease Father 63   Prostate cancer Father    Hyperlipidemia Father    Hypertension Father    Colon cancer Neg Hx     Esophageal cancer Neg Hx    Rectal cancer Neg Hx    Breast cancer Neg Hx    Pancreatic cancer Neg Hx     Social History: Social History   Socioeconomic History   Marital status: Married    Spouse name: Not on file   Number of children: 3   Years of education: 12    Highest education level: Some college, no degree  Occupational History   Occupation: retired  Tobacco Use   Smoking status: Never   Smokeless tobacco: Never  Vaping Use   Vaping Use: Never used  Substance and Sexual Activity   Alcohol use: Not Currently   Drug use: Never   Sexual activity: Not on file  Other Topics Concern   Not on file  Social History Narrative   Married   [place] 2   Retired    Science writer Determinants of [place] Strain: Patient Declined (09/02/2022)   Overall Financial Resource Strain (CARDIA)    Difficulty of Paying Living Expenses: Patient declined  Food Insecurity: Patient Declined (09/02/2022)   Hunger Vital Sign    Worried About Running Out of Food in the Last Year: Patient declined    [place] in the Last Year: Patient declined  Transportation Needs: Patient Declined (09/02/2022)   PRAPARE - Hydrologist (Medical): Patient declined    Lack of Transportation (Non-Medical): Patient declined  Physical Activity: Unknown (09/02/2022)   Exercise Vital Sign    Days of Exercise per Week: Patient declined    Minutes of Exercise per Session: Not on file  Stress: Patient Declined (09/02/2022)   [employer] of Stress : Patient declined  Social Connections: Unknown (09/02/2022)   Social Connection and Isolation Panel [NHANES]    Frequency of Communication with Friends and Family: Patient declined    Frequency of Social Gatherings with Friends and Family: Patient declined    Attends Religious Services: Patient declined    Marine scientist or Organizations: Patient  declined    Attends Archivist Meetings: Not on file    Marital Status: Patient declined    Allergies: Allergies  Allergen Reactions   Doxycycline Nausea And Vomiting   Erythromycin Base Other (See Comments)    yeast infection, GI upset   Metronidazole Diarrhea, Itching and Nausea Only   Naproxen Other (See Comments)    GI upset   Prednisone Hives    REACTION: hives, anxiety, insomnia   Scallops [Shellfish Allergy] Nausea And Vomiting    Outpatient Meds: Current Outpatient Medications  Medication Sig Dispense Refill   amLODipine (NORVASC) 5 MG tablet Take 1 tablet (5 mg total) by mouth daily. 90 tablet 3   Azelastine HCl 137 MCG/SPRAY SOLN PLACE 2 SPRAYS INTO BOTH NOSTRILS 2 TIMES DAILY - USE IN EACH NOSTRIL AS DIRECTED 30 mL 1  B Complex Vitamins (B COMPLEX PO) Take by mouth.     Biotin 2500 MCG CAPS Take by mouth.     cetirizine (ZYRTEC) 5 MG tablet Take 5 mg by mouth as needed for allergies.     Cholecalciferol (VITAMIN D3) 125 MCG (5000 UT) CAPS Take by mouth.     losartan-hydrochlorothiazide (HYZAAR) 100-25 MG tablet Take 1 tablet by mouth daily. 90 tablet 0   metoprolol succinate (TOPROL-XL) 50 MG 24 hr tablet TAKE ONE TABLET BY MOUTH ONE TIME DAILY WITH OR IMMEDIATELY AFTER A MEAL 90 tablet 1   Multiple Vitamins-Minerals (MULTIVITAMIN WOMEN 50+ PO) Take by mouth.     sertraline (ZOLOFT) 50 MG tablet TAKE ONE TABLET BY MOUTH ONE TIME DAILY 90 tablet 3   traMADol (ULTRAM) 50 MG tablet TAKE ONE TABLET BY MOUTH EVERY 4 HOURS AS NEEDED FOR UP TO 5 DAYS 60 tablet 1   No current facility-administered medications for this visit.      ___________________________________________________________________ Objective   Exam:  BP 138/82   Pulse 82   Ht 5\' 6"  (1.676 m)   Wt 176 lb 12.8 oz (80.2 kg)   SpO2 99%   BMI 28.54 kg/m  Wt Readings from Last 3 Encounters:  10/23/22 176 lb 12.8 oz (80.2 kg)  09/03/22 181 lb 4.8 oz (82.2 kg)  08/29/22 179 lb (81.2 kg)     General: Somewhat hoarse with a dry cough Eyes: sclera anicteric, no redness ENT: oral mucosa moist without lesions, no cervical or supraclavicular lymphadenopathy CV: Regular without appreciable murmur, no JVD, no peripheral edema Resp: clear to auscultation bilaterally, normal RR and effort noted.  No wheezing, no stridor GI: soft, no tenderness, with active bowel sounds. No guarding or palpable organomegaly noted. Skin; warm and dry, no rash or jaundice noted Neuro: awake, alert and oriented x 3. Normal gross motor function and fluent speech  Labs:     Latest Ref Rng & Units 08/29/2022    7:00 PM 04/28/2022    8:42 AM 04/07/2022   11:45 AM  CMP  Glucose 70 - 99 mg/dL 119  94  97   BUN 8 - 23 mg/dL 22  22  18    Creatinine 0.44 - 1.00 mg/dL 0.92  0.76  0.77   Sodium 135 - 145 mmol/L 135  138  140   Potassium 3.5 - 5.1 mmol/L 3.9  3.5  4.0   Chloride 98 - 111 mmol/L 100  102  103   CO2 22 - 32 mmol/L 25  29  29    Calcium 8.9 - 10.3 mg/dL 8.8  9.5  9.8   Total Protein 6.5 - 8.1 g/dL 7.0  6.8  6.8   Total Bilirubin 0.3 - 1.2 mg/dL 1.3  0.7  0.9   Alkaline Phos 38 - 126 U/L 78  70  77   AST 15 - 41 U/L 32  19  21   ALT 0 - 44 U/L 22  15  17       Radiologic Studies:  CLINICAL DATA:  Right-sided flank pain   EXAM: CT ABDOMEN AND PELVIS WITH CONTRAST   TECHNIQUE: Multidetector CT imaging of the abdomen and pelvis was performed using the standard protocol following bolus administration of intravenous contrast.   RADIATION DOSE REDUCTION: This exam was performed according to the departmental dose-optimization program which includes automated exposure control, adjustment of the mA and/or kV according to patient size and/or use of iterative reconstruction technique.   CONTRAST:  11mL OMNIPAQUE IOHEXOL  300 MG/ML  SOLN   COMPARISON:  MRI 12/29/2013, CT 12/19/2013   FINDINGS: Lower chest: Lung bases demonstrate no acute airspace disease. Partially calcified breast  implants.   Hepatobiliary: Right hepatic cysts for which no additional imaging follow-up is recommended. Status post cholecystectomy. Mild intra hepatic biliary dilatation without change. Enlarged common bile duct, now measuring 2.1 cm, previously 1.7 cm.   Pancreas: No inflammation. Interval increase in size of posterior pancreatic head 6 thick lesion, this now measures 5.5 x 4 cm, previously 2.7 cm.   Spleen: Normal in size without focal abnormality.   Adrenals/Urinary Tract: Right adrenal gland is normal. Stable left adrenal gland nodules measuring up to 14 mm, previously 14 mm and previously characterized as adenoma, no imaging follow-up recommended. No hydronephrosis. The bladder is unremarkable.   Stomach/Bowel: The stomach is nonenlarged. No dilated small bowel. Mild wall thickening and inflammation at the splenic flexure and descending colon, for example series 2, image 38.   Vascular/Lymphatic: Mild aortic atherosclerosis. No aneurysm. Retroaortic left renal vein.   Reproductive: Status post hysterectomy. No adnexal masses.   Other: Negative for pelvic effusion or free air.   Musculoskeletal: No acute or suspicious osseous abnormality   IMPRESSION: 1. Mild wall thickening and inflammation involving the splenic flexure and descending colon consistent with colitis of infectious, inflammatory, or ischemic etiology. 2. Interval increase in size of posterior pancreatic head cyst or cystic mass now measuring up to 5.5 cm, previously 2.7 cm. When the patient is clinically stable and able to follow directions and hold their breath (preferably as an outpatient) further evaluation with dedicated abdominal MRI should be considered. 3. Status post cholecystectomy. Enlarged common bile duct now measuring up to 2.1 cm, previously 1.7 cm. This may also be assessed at follow-up nonemergent MRI 4. Aortic atherosclerosis.   Aortic Atherosclerosis (ICD10-I70.0).      Electronically Signed   By: Donavan Foil M.D.   On: 08/29/2022 20:18      _____________________________  CLINICAL DATA:  Cystic pancreatic lesion and biliary ductal dilatation on recent CT.   EXAM: MRI ABDOMEN WITHOUT AND WITH CONTRAST   TECHNIQUE: Multiplanar multisequence MR imaging of the abdomen was performed both before and after the administration of intravenous contrast.   CONTRAST:  8 mL Vueway   COMPARISON:  CT on 08/29/2022, and MRI on 12/29/2013   FINDINGS: Lower chest: No acute findings.   Hepatobiliary: No hepatic masses identified. A few small hepatic cysts are noted. Prior cholecystectomy. Mild diffuse biliary ductal dilatation is seen, with common bile duct measuring 16 mm in diameter. No evidence of choledocholithiasis.   Pancreas: A cystic lesion is seen in the porta hepatis along the posterior aspect of the common bile duct and pancreatic head. This measures 5.7 x 4.3 x 5.1 cm. Possible communication with the distal common bile duct is noted. This lesion shows 1 or 2 thin internal septations, but no evidence of thickened or nodular septations or mural nodules. No evidence of pancreatic ductal dilatation or pancreas divisum. Differential diagnosis includes cystic pancreatic neoplasm, pseudocyst, and choledochocele.   Spleen:  Within normal limits in size and appearance.   Adrenals/Urinary Tract: 2 tiny sub-cm left adrenal gland nodules are seen which show contrast dropout on chemical shift imaging, consistent with benign adenomas. No suspicious renal masses identified. No evidence of hydronephrosis.   Stomach/Bowel: Unremarkable.   Vascular/Lymphatic: No pathologically enlarged lymph nodes identified. No acute vascular findings.   Other:  None.   Musculoskeletal:  No suspicious bone lesions  identified.   IMPRESSION: 5.7 cm cystic lesion along the posterior margin of the pancreatic head and common bile duct. Possible communication with the  distal common bile duct. Differential diagnosis includes cystic pancreatic neoplasm, choledococele, and pseudocyst. ERCP should be considered for further evaluation.   Mild diffuse biliary ductal dilatation, without evidence of choledocholithiasis.   Tiny benign left adrenal gland adenomas (no followup imaging is recommended).     Electronically Signed   By: Marlaine Hind M.D.   On: 09/25/2022 13:49   _________________________  Neg cologuard 2017 and 2020 Says she has one "in process" now   Assessment: Encounter Diagnoses  Name Primary?   Gastroesophageal reflux disease without esophagitis Yes   Abnormal finding on GI tract imaging    Pancreatic cyst     Not clear what caused the pain bringing her to the ED.  Nothing seen on this contrast CT scan to explain it, particularly no obstruction, kidney stone (though that may have been obscured with IV contrast), and I think the reported splenic flexure colon abnormality is of questionable clinical significance.  I do not think she likely had a true colitis as there is no diarrhea and it does not coincide with the location of her pain that day.  We discussed how various areas in the GI tract may be described as having wall thickening or questionable inflammation due to bowel motility. The enlarging pancreatic cyst is also incidentally seen on that scan and I discussed that along with findings on previous CT scan.  I believe this pancreatic cystic lesion needs further evaluation with EUS and possible aspiration for sampling and fluid analysis. My initial messaging with Dr. Rush Landmark about this a few weeks back was that he agreed with that.  I described how an endoscopic ultrasound was performed and some of the risks, and that would be done in greater detail with Dr. Rush Landmark if she has the EUS.  She was agreeable to that, so I will forward this note over to Dr. Rush Landmark for complete review along with the recent MRI images.  If he agrees  at this point that EUS should be pursued, it will be scheduled through his nurse.  No additional testing or changes in treatment advised at this point.  Thank you for the courtesy of this consult.  Please call me with any questions or concerns.  Nelida Meuse III  CC: Referring provider noted above

## 2022-10-24 ENCOUNTER — Encounter: Payer: Self-pay | Admitting: Family Medicine

## 2022-10-24 ENCOUNTER — Ambulatory Visit (INDEPENDENT_AMBULATORY_CARE_PROVIDER_SITE_OTHER): Payer: Medicare HMO | Admitting: Family Medicine

## 2022-10-24 ENCOUNTER — Telehealth: Payer: Self-pay

## 2022-10-24 VITALS — BP 121/78 | HR 66 | Temp 98.0°F | Wt 177.6 lb

## 2022-10-24 DIAGNOSIS — J329 Chronic sinusitis, unspecified: Secondary | ICD-10-CM | POA: Diagnosis not present

## 2022-10-24 DIAGNOSIS — R051 Acute cough: Secondary | ICD-10-CM

## 2022-10-24 DIAGNOSIS — B9689 Other specified bacterial agents as the cause of diseases classified elsewhere: Secondary | ICD-10-CM | POA: Diagnosis not present

## 2022-10-24 DIAGNOSIS — J302 Other seasonal allergic rhinitis: Secondary | ICD-10-CM | POA: Diagnosis not present

## 2022-10-24 MED ORDER — MONTELUKAST SODIUM 10 MG PO TABS: 10.0000 mg | ORAL_TABLET | Freq: Every day | ORAL | 3 refills | Status: DC

## 2022-10-24 MED ORDER — AMOXICILLIN-POT CLAVULANATE 875-125 MG PO TABS: 1.0000 | ORAL_TABLET | Freq: Two times a day (BID) | ORAL | 0 refills | Status: DC

## 2022-10-24 MED ORDER — BENZONATATE 200 MG PO CAPS: 200.0000 mg | ORAL_CAPSULE | Freq: Two times a day (BID) | ORAL | 0 refills | Status: DC | PRN

## 2022-10-24 NOTE — Telephone Encounter (Signed)
-----   Message from Lemar Lofty., MD sent at 10/23/2022  5:11 PM EDT ----- HD, LFTs were normal in [place]. I am OK to proceed first as just EUS and no plan for ERCP unless something else is found. Reasonable with this size of a lesion for a CA19-9 to be obtained. Thanks. GM  Kolby Schara, Please reach out to patient and have her scheduled for EUS. Thanks. GM ----- Message ----- From: Sherrilyn Rist, MD Sent: 10/23/2022   1:08 PM EDT To: Lemar Lofty., MD  Gabe,  You and I communicated a few weeks back about this patient of mine with a pancreatic cystic lesion found to have enlarged from 2.7 cm in 2015 to 5.5 centimeter on a recent CT scan.  MRI was then done. If you agree that she warrants an EUS, then she is agreeable after discussion with me in clinic today and would need to be contacted for scheduling.  Thanks very much,  HD

## 2022-10-24 NOTE — Patient Instructions (Addendum)
Xyzal before bed, instead of zyrtec.  Return if symptoms worsen or fail to improve.        Great to see you today.

## 2022-10-24 NOTE — Progress Notes (Signed)
Rebecca Reid , 05-04-1946, 77 y.o., female MRN: [id number] Patient Care Team    Relationship Specialty Notifications Start End  Kristian Covey, MD PCP - General   76/14/11   Verner Chol, [facility] (Inactive) Pharmacist Pharmacist  04/11/20    Comment: Phone: [phone]    Chief Complaint  Patient presents with   Cough    1.5 weeks; head feels full     Subjective: Rebecca Reid is a 77 y.o. Pt presents for an OV with complaints of head congestion of 10 days duration.  Associated symptoms include cough and sinus pressure. She denies fever or chills. She reports she has fairly bad allergies, but this year seems to have been worse. She use to be prescribed Singulair, but does not recall why it was stopped.  Reviewed most recent labs- normal kidney fx.   Pt has tried daily antihistamine to ease their symptoms.      09/25/2022    3:04 PM 06/06/2022    4:42 PM 04/25/2022    3:03 PM 09/23/2021    3:34 PM 08/07/2021   11:39 AM  Depression screen PHQ 2/9  Decreased Interest 0 0 0 0 0  Down, Depressed, Hopeless 0 0 0 0 0  PHQ - 2 Score 0 0 0 0 0  Altered sleeping 1 0     Tired, decreased energy 0 0     Change in appetite 0 0     Feeling bad or failure about yourself  0 0     Trouble concentrating 0 0     Moving slowly or fidgety/restless 0 0     Suicidal thoughts 0 0     PHQ-9 Score 1 0     Difficult doing work/chores Not difficult at all        Allergies  Allergen Reactions   Doxycycline Nausea And Vomiting   Erythromycin Base Other (See Comments)    yeast infection, GI upset   Metronidazole Diarrhea, Itching and Nausea Only   Naproxen Other (See Comments)    GI upset   Prednisone Hives    REACTION: hives, anxiety, insomnia   Scallops [Shellfish Allergy] Nausea And Vomiting   Social History   Social History Narrative   Married   HH 2   Retired    Past Medical History:  Diagnosis Date   ALLERGIC RHINITIS CAUSE UNSPECIFIED 04/30/2009   Anxiety     Arthritis    DEPRESSION, HX OF 04/11/2010   Dysuria 05/06/2010   Gallstones    GERD 04/30/2009   HYPERLIPIDEMIA 04/30/2009   HYPERTENSION 04/30/2009   Lactose intolerance    LEG EDEMA 12/12/2009   OSTEOPENIA 04/30/2009   PVC (premature ventricular contraction)    Status post dilation of esophageal narrowing    Past Surgical History:  Procedure Laterality Date   ABDOMINAL HYSTERECTOMY  1986   AUGMENTATION MAMMAPLASTY Bilateral    BREAST ENHANCEMENT SURGERY  1987   BREAST SURGERY  1974   biopsy   CARDIAC CATHETERIZATION N/A 11/02/2015   Procedure: Left Heart Cath and Coronary Angiography;  Surgeon: Lyn Records, MD;  Location: [facility] INVASIVE CV LAB;  Service: Cardiovascular;  Laterality: N/A;   Carpel tunel Right    CATARACT EXTRACTION, BILATERAL Bilateral    one in feb and one in March Dr. Darel Hong   CHOLECYSTECTOMY  1974   ectopic pregnancy     finger surgery  2019   Dupuytren's contracture surgery   OTHER SURGICAL HISTORY  Stomach was not developed at birth   Family History  Problem Relation Age of Onset   Emphysema Mother        heavy smoker   Heart disease Mother 6870   Hyperlipidemia Mother    Hypertension Mother    Skin cancer Mother        melanomia, spread to her spine   Arthritis Mother    Kidney cancer Mother    Heart disease Father [id number]   Prostate cancer Father    Hyperlipidemia Father    Hypertension Father    Colon cancer Neg Hx    Esophageal cancer Neg Hx    Rectal cancer Neg Hx    Breast cancer Neg Hx    Pancreatic cancer Neg Hx    Allergies as of 10/24/2022       Reactions   Doxycycline Nausea And Vomiting   Erythromycin Base Other (See Comments)   yeast infection, GI upset   Metronidazole Diarrhea, Itching, Nausea Only   Naproxen Other (See Comments)   GI upset   Prednisone Hives   REACTION: hives, anxiety, insomnia   Scallops [shellfish Allergy] Nausea And Vomiting        Medication List        Accurate as of October 24, 2022 10:08 AM.  If you have any questions, ask your nurse or doctor.          amLODipine 5 MG tablet Commonly known as: NORVASC Take 1 tablet (5 mg total) by mouth daily.   amoxicillin-clavulanate 875-125 MG tablet Commonly known as: AUGMENTIN Take 1 tablet by mouth 2 (two) times daily. Started by: Felix Pacinienee Trayvion Embleton, DO   Azelastine HCl 137 MCG/SPRAY Soln PLACE 2 SPRAYS INTO BOTH NOSTRILS 2 TIMES DAILY - USE IN EACH NOSTRIL AS DIRECTED   B COMPLEX PO Take by mouth.   benzonatate 200 MG capsule Commonly known as: TESSALON Take 1 capsule (200 mg total) by mouth 2 (two) times daily as needed for cough. Started by: Felix Pacinienee Aiman Sonn, DO   Biotin 2500 MCG Caps Take by mouth.   cetirizine 5 MG tablet Commonly known as: ZYRTEC Take 5 mg by mouth as needed for allergies.   losartan-hydrochlorothiazide 100-25 MG tablet Commonly known as: HYZAAR Take 1 tablet by mouth daily.   metoprolol succinate 50 MG 24 hr tablet Commonly known as: TOPROL-XL TAKE ONE TABLET BY MOUTH ONE TIME DAILY WITH OR IMMEDIATELY AFTER A MEAL   montelukast 10 MG tablet Commonly known as: SINGULAIR Take 1 tablet (10 mg total) by mouth at bedtime. Started by: Felix Pacinienee Wayland Baik, DO   MULTIVITAMIN WOMEN 50+ PO Take by mouth.   sertraline 50 MG tablet Commonly known as: ZOLOFT TAKE ONE TABLET BY MOUTH ONE TIME DAILY   traMADol 50 MG tablet Commonly known as: ULTRAM TAKE ONE TABLET BY MOUTH EVERY 4 HOURS AS NEEDED FOR UP TO 5 DAYS   Vitamin D3 125 MCG (5000 UT) capsule Generic drug: Cholecalciferol Take by mouth.        All past medical history, surgical history, allergies, family history, immunizations andmedications were updated in the EMR today and reviewed under the history and medication portions of their EMR.     ROS Negative, with the exception of above mentioned in HPI   Objective:  BP 121/78   Pulse 66   Temp 98 F (36.7 C)   Wt 177 lb 9.6 oz (80.6 kg)   SpO2 99%   BMI 28.67 kg/m  Body mass index is  28.67 kg/m. Physical  Exam Vitals and nursing note reviewed.  Constitutional:      General: She is not in acute distress.    Appearance: Normal appearance. She is normal weight. She is not ill-appearing or toxic-appearing.  HENT:     Head: Normocephalic and atraumatic.     Comments: TTP frontal sinus    Right Ear: Tympanic membrane normal.     Left Ear: Tympanic membrane and ear canal normal.     Nose: Congestion and rhinorrhea present.     Mouth/Throat:     Mouth: Mucous membranes are moist.     Pharynx: Posterior oropharyngeal erythema present. No oropharyngeal exudate.  Eyes:     General: No scleral icterus.       Right eye: No discharge.        Left eye: No discharge.     Extraocular Movements: Extraocular movements intact.     Conjunctiva/sclera: Conjunctivae normal.     Pupils: Pupils are equal, round, and reactive to light.  Cardiovascular:     Rate and Rhythm: Normal rate and regular rhythm.  Pulmonary:     Effort: Pulmonary effort is normal.     Breath sounds: Rhonchi present.  Musculoskeletal:     Cervical back: Neck supple. Tenderness present.     Right lower leg: No edema.     Left lower leg: No edema.  Lymphadenopathy:     Cervical: Cervical adenopathy present.  Skin:    Findings: No rash.  Neurological:     Mental Status: She is alert and oriented to person, place, and time. Mental status is at baseline.     Motor: No weakness.     Coordination: Coordination normal.     Gait: Gait normal.  Psychiatric:        Mood and Affect: Mood normal.        Behavior: Behavior normal.        Thought Content: Thought content normal.        Judgment: Judgment normal.      No results found. No results found. No results found for this or any previous visit (from the past 24 hour(s)).  Assessment/Plan: Rebecca Reid is a 77 y.o. female present for OV for  Bacterial sinusitis Rest, hydrate.  Astelin , mucinex (DM if cough), nettie pot or nasal saline.  augmentin  prescribed, take until completed.  F/U 2 weeks if not improved.    Acute cough Tessalon perles prescribed.  Seasonal allergies Switch OTC AH to xyzal qhs Added singulair today (she had been on prior)  Reviewed expectations re: course of current medical issues. Discussed self-management of symptoms. Outlined signs and symptoms indicating need for more acute intervention. Patient verbalized understanding and all questions were answered. Patient received an After-Visit Summary.    No orders of the defined types were placed in this encounter.  Meds ordered this encounter  Medications   amoxicillin-clavulanate (AUGMENTIN) 875-125 MG tablet    Sig: Take 1 tablet by mouth 2 (two) times daily.    Dispense:  20 tablet    Refill:  0   benzonatate (TESSALON) 200 MG capsule    Sig: Take 1 capsule (200 mg total) by mouth 2 (two) times daily as needed for cough.    Dispense:  20 capsule    Refill:  0   montelukast (SINGULAIR) 10 MG tablet    Sig: Take 1 tablet (10 mg total) by mouth at bedtime.    Dispense:  90 tablet    Refill:  3  Referral Orders  No referral(s) requested today     Note is dictated utilizing voice recognition software. Although note has been proof read prior to signing, occasional typographical errors still can be missed. If any questions arise, please do not hesitate to call for verification.   electronically signed by:  Howard Pouch, DO  [place]

## 2022-10-28 DIAGNOSIS — Z1211 Encounter for screening for malignant neoplasm of colon: Secondary | ICD-10-CM | POA: Diagnosis not present

## 2022-10-29 ENCOUNTER — Other Ambulatory Visit: Payer: Self-pay

## 2022-10-29 DIAGNOSIS — K862 Cyst of pancreas: Secondary | ICD-10-CM

## 2022-10-29 NOTE — Telephone Encounter (Signed)
EUS has been scheduled for 12/04/22 at 830 am at [facility] with GM   Left message on machine to call back

## 2022-10-30 ENCOUNTER — Other Ambulatory Visit: Payer: Self-pay | Admitting: Family Medicine

## 2022-10-30 NOTE — Telephone Encounter (Signed)
EUS scheduled, pt instructed and medications reviewed.  Patient instructions mailed to home.  Patient to call with any questions or concerns.  

## 2022-11-05 LAB — COLOGUARD: COLOGUARD: POSITIVE — AB

## 2022-11-07 ENCOUNTER — Encounter: Payer: Self-pay | Admitting: Family Medicine

## 2022-11-07 ENCOUNTER — Other Ambulatory Visit: Payer: Self-pay | Admitting: Family

## 2022-11-07 ENCOUNTER — Encounter: Payer: Self-pay | Admitting: Gastroenterology

## 2022-11-07 DIAGNOSIS — R195 Other fecal abnormalities: Secondary | ICD-10-CM

## 2022-11-10 NOTE — Telephone Encounter (Signed)
Antibiotic use or certain foods are not known to affect the Cologuard test.  Positive Cologuard test needs a colonoscopy to evaluate for polyps, which is the most likely explanation for the positive test.  Okay for direct book of that procedure with me in the [facility] since I recently saw her in clinic.  -HD

## 2022-11-12 ENCOUNTER — Encounter: Payer: Self-pay | Admitting: Gastroenterology

## 2022-11-16 DIAGNOSIS — E739 Lactose intolerance, unspecified: Secondary | ICD-10-CM | POA: Insufficient documentation

## 2022-11-19 ENCOUNTER — Encounter: Payer: Self-pay | Admitting: Gastroenterology

## 2022-11-19 ENCOUNTER — Ambulatory Visit (AMBULATORY_SURGERY_CENTER): Payer: Medicare HMO

## 2022-11-19 VITALS — Ht 66.0 in | Wt 177.0 lb

## 2022-11-19 DIAGNOSIS — Z1211 Encounter for screening for malignant neoplasm of colon: Secondary | ICD-10-CM

## 2022-11-19 MED ORDER — NA SULFATE-K SULFATE-MG SULF 17.5-3.13-1.6 GM/177ML PO SOLN: 1.0000 | Freq: Once | ORAL | 0 refills | Status: AC

## 2022-11-19 NOTE — Progress Notes (Signed)

## 2022-11-26 ENCOUNTER — Encounter (HOSPITAL_COMMUNITY): Payer: Self-pay | Admitting: Gastroenterology

## 2022-11-27 NOTE — Progress Notes (Signed)
Attempted to obtain medical history via telephone, unable to reach at this time. HIPAA compliant voicemail message left requesting return call to pre surgical testing department. 

## 2022-11-28 DIAGNOSIS — M25561 Pain in right knee: Secondary | ICD-10-CM | POA: Diagnosis not present

## 2022-11-28 DIAGNOSIS — M25562 Pain in left knee: Secondary | ICD-10-CM | POA: Diagnosis not present

## 2022-12-01 ENCOUNTER — Encounter: Payer: Self-pay | Admitting: Family Medicine

## 2022-12-03 NOTE — Anesthesia Preprocedure Evaluation (Signed)
Anesthesia Evaluation  Patient identified by MRN, date of birth, ID band Patient awake    Reviewed: Allergy & Precautions, NPO status , Patient's Chart, lab work & pertinent test results  Airway Mallampati: II  TM Distance: >3 FB Neck ROM: Full    Dental no notable dental hx.    Pulmonary neg pulmonary ROS   Pulmonary exam normal        Cardiovascular hypertension, Pt. on medications and Pt. on home beta blockers  Rhythm:Regular Rate:Normal     Neuro/Psych   Anxiety        GI/Hepatic Neg liver ROS,GERD  Medicated,,Pancreatic lesion    Endo/Other  negative endocrine ROS    Renal/GU negative Renal ROS     Musculoskeletal  (+) Arthritis , Osteoarthritis,    Abdominal Normal abdominal exam  (+)   Peds  Hematology negative hematology ROS (+)   Anesthesia Other Findings   Reproductive/Obstetrics                             Anesthesia Physical Anesthesia Plan  ASA: 2  Anesthesia Plan: MAC   Post-op Pain Management:    Induction: Intravenous  PONV Risk Score and Plan: 2 and Propofol infusion and Treatment may vary due to age or medical condition  Airway Management Planned: Simple Face Mask and Nasal Cannula  Additional Equipment: None  Intra-op Plan:   Post-operative Plan:   Informed Consent: I have reviewed the patients History and Physical, chart, labs and discussed the procedure including the risks, benefits and alternatives for the proposed anesthesia with the patient or authorized representative who has indicated his/her understanding and acceptance.     Dental advisory given  Plan Discussed with:   Anesthesia Plan Comments:        Anesthesia Quick Evaluation

## 2022-12-04 ENCOUNTER — Observation Stay (HOSPITAL_COMMUNITY): Payer: Medicare HMO

## 2022-12-04 ENCOUNTER — Other Ambulatory Visit: Payer: Self-pay

## 2022-12-04 ENCOUNTER — Encounter (HOSPITAL_COMMUNITY)
Admission: RE | Disposition: A | Discharge status: Home / Self Care | Payer: Self-pay | Source: Home / Self Care | Attending: Internal Medicine

## 2022-12-04 ENCOUNTER — Observation Stay (HOSPITAL_COMMUNITY)
Admission: RE | Admit: 2022-12-04 | Discharge: 2022-12-05 | Disposition: A | Discharge status: Home / Self Care | Payer: Medicare HMO | Attending: Internal Medicine | Admitting: Internal Medicine

## 2022-12-04 ENCOUNTER — Encounter (HOSPITAL_COMMUNITY): Payer: Self-pay | Admitting: Gastroenterology

## 2022-12-04 ENCOUNTER — Ambulatory Visit (HOSPITAL_COMMUNITY): Payer: Medicare HMO

## 2022-12-04 ENCOUNTER — Ambulatory Visit (HOSPITAL_COMMUNITY): Payer: Medicare HMO | Admitting: Anesthesiology

## 2022-12-04 DIAGNOSIS — R933 Abnormal findings on diagnostic imaging of other parts of digestive tract: Secondary | ICD-10-CM | POA: Diagnosis not present

## 2022-12-04 DIAGNOSIS — D378 Neoplasm of uncertain behavior of other specified digestive organs: Secondary | ICD-10-CM | POA: Diagnosis not present

## 2022-12-04 DIAGNOSIS — K862 Cyst of pancreas: Secondary | ICD-10-CM | POA: Diagnosis not present

## 2022-12-04 DIAGNOSIS — I10 Essential (primary) hypertension: Secondary | ICD-10-CM | POA: Diagnosis not present

## 2022-12-04 DIAGNOSIS — D649 Anemia, unspecified: Secondary | ICD-10-CM | POA: Insufficient documentation

## 2022-12-04 DIAGNOSIS — Z79899 Other long term (current) drug therapy: Secondary | ICD-10-CM | POA: Insufficient documentation

## 2022-12-04 DIAGNOSIS — R079 Chest pain, unspecified: Secondary | ICD-10-CM

## 2022-12-04 DIAGNOSIS — E876 Hypokalemia: Secondary | ICD-10-CM | POA: Insufficient documentation

## 2022-12-04 DIAGNOSIS — F418 Other specified anxiety disorders: Secondary | ICD-10-CM | POA: Diagnosis present

## 2022-12-04 DIAGNOSIS — K2289 Other specified disease of esophagus: Secondary | ICD-10-CM | POA: Insufficient documentation

## 2022-12-04 DIAGNOSIS — R932 Abnormal findings on diagnostic imaging of liver and biliary tract: Secondary | ICD-10-CM | POA: Diagnosis not present

## 2022-12-04 DIAGNOSIS — R0789 Other chest pain: Secondary | ICD-10-CM | POA: Diagnosis not present

## 2022-12-04 DIAGNOSIS — K3189 Other diseases of stomach and duodenum: Secondary | ICD-10-CM | POA: Diagnosis not present

## 2022-12-04 DIAGNOSIS — I899 Noninfective disorder of lymphatic vessels and lymph nodes, unspecified: Secondary | ICD-10-CM | POA: Diagnosis not present

## 2022-12-04 DIAGNOSIS — K7689 Other specified diseases of liver: Secondary | ICD-10-CM | POA: Diagnosis not present

## 2022-12-04 DIAGNOSIS — R109 Unspecified abdominal pain: Secondary | ICD-10-CM | POA: Diagnosis not present

## 2022-12-04 DIAGNOSIS — K449 Diaphragmatic hernia without obstruction or gangrene: Secondary | ICD-10-CM | POA: Diagnosis not present

## 2022-12-04 DIAGNOSIS — F419 Anxiety disorder, unspecified: Secondary | ICD-10-CM | POA: Diagnosis not present

## 2022-12-04 DIAGNOSIS — K319 Disease of stomach and duodenum, unspecified: Secondary | ICD-10-CM | POA: Diagnosis not present

## 2022-12-04 DIAGNOSIS — K2970 Gastritis, unspecified, without bleeding: Secondary | ICD-10-CM | POA: Diagnosis not present

## 2022-12-04 DIAGNOSIS — K869 Disease of pancreas, unspecified: Secondary | ICD-10-CM | POA: Insufficient documentation

## 2022-12-04 DIAGNOSIS — K219 Gastro-esophageal reflux disease without esophagitis: Secondary | ICD-10-CM | POA: Diagnosis present

## 2022-12-04 HISTORY — PX: ESOPHAGOGASTRODUODENOSCOPY: SHX5428

## 2022-12-04 HISTORY — PX: EUS: SHX5427

## 2022-12-04 HISTORY — PX: FINE NEEDLE ASPIRATION: SHX5430

## 2022-12-04 HISTORY — PX: BIOPSY: SHX5522

## 2022-12-04 LAB — PHOSPHORUS: Phosphorus: 3 mg/dL (ref 2.5–4.6)

## 2022-12-04 LAB — COMPREHENSIVE METABOLIC PANEL
ALT: 18 U/L (ref 0–44)
AST: 25 U/L (ref 15–41)
Albumin: 3.7 g/dL (ref 3.5–5.0)
Alkaline Phosphatase: 66 U/L (ref 38–126)
Anion gap: 7 (ref 5–15)
BUN: 18 mg/dL (ref 8–23)
CO2: 26 mmol/L (ref 22–32)
Calcium: 8.9 mg/dL (ref 8.9–10.3)
Chloride: 104 mmol/L (ref 98–111)
Creatinine, Ser: 0.73 mg/dL (ref 0.44–1.00)
GFR, Estimated: >60 mL/min (ref >60)
Glucose, Bld: 126 mg/dL — ABNORMAL HIGH (ref 70–99)
Potassium: 3 mmol/L — ABNORMAL LOW (ref 3.5–5.1)
Sodium: 137 mmol/L (ref 135–145)
Total Bilirubin: 0.6 mg/dL (ref 0.3–1.2)
Total Protein: 6.5 g/dL (ref 6.5–8.1)

## 2022-12-04 LAB — TROPONIN I (HIGH SENSITIVITY): Troponin I (High Sensitivity): 3 ng/L (ref <18)

## 2022-12-04 LAB — CBC
HCT: 35.1 % — ABNORMAL LOW (ref 36.0–46.0)
Hemoglobin: 11.5 g/dL — ABNORMAL LOW (ref 12.0–15.0)
MCH: 28 pg (ref 26.0–34.0)
MCHC: 32.8 g/dL (ref 30.0–36.0)
MCV: 85.4 fL (ref 80.0–100.0)
Platelets: 196 10*3/uL (ref 150–400)
RBC: 4.11 MIL/uL (ref 3.87–5.11)
RDW: 13.8 % (ref 11.5–15.5)
WBC: 6.4 10*3/uL (ref 4.0–10.5)
nRBC: 0 % (ref 0.0–0.2)

## 2022-12-04 LAB — AMYLASE: Amylase: 59 U/L (ref 28–100)

## 2022-12-04 LAB — LIPASE, BLOOD: Lipase: 36 U/L (ref 11–51)

## 2022-12-04 LAB — C-REACTIVE PROTEIN: CRP: 0.6 mg/dL (ref <1.0)

## 2022-12-04 LAB — MAGNESIUM: Magnesium: 1.7 mg/dL (ref 1.7–2.4)

## 2022-12-04 SURGERY — UPPER ENDOSCOPIC ULTRASOUND (EUS) RADIAL
Anesthesia: Monitor Anesthesia Care

## 2022-12-04 MED ORDER — ACETAMINOPHEN 325 MG PO TABS
650.0000 mg | ORAL_TABLET | Freq: Four times a day (QID) | ORAL | Status: DC | PRN
  Administered 2022-12-05: 650 mg via ORAL
  Filled 2022-12-04: qty 2

## 2022-12-04 MED ORDER — LACTATED RINGERS IV BOLUS: 500.0000 mL | Freq: Once | INTRAVENOUS | Status: DC

## 2022-12-04 MED ORDER — METOPROLOL SUCCINATE ER 50 MG PO TB24
50.0000 mg | ORAL_TABLET | Freq: Every day | ORAL | Status: DC
  Administered 2022-12-05: 50 mg via ORAL
  Filled 2022-12-04: qty 1

## 2022-12-04 MED ORDER — ALUM & MAG HYDROXIDE-SIMETH 200-200-20 MG/5ML PO SUSP
30.0000 mL | Freq: Four times a day (QID) | ORAL | Status: DC | PRN
  Administered 2022-12-05 (×3): 30 mL via ORAL
  Filled 2022-12-04 (×3): qty 30

## 2022-12-04 MED ORDER — ALUM & MAG HYDROXIDE-SIMETH 200-200-20 MG/5ML PO SUSP
30.0000 mL | Freq: Once | ORAL | Status: AC
  Administered 2022-12-04: 30 mL via ORAL
  Filled 2022-12-04: qty 30

## 2022-12-04 MED ORDER — PROPOFOL 500 MG/50ML IV EMUL
INTRAVENOUS | Status: AC
  Filled 2022-12-04: qty 100

## 2022-12-04 MED ORDER — AMLODIPINE BESYLATE 5 MG PO TABS
5.0000 mg | ORAL_TABLET | Freq: Every day | ORAL | Status: DC
  Administered 2022-12-05: 5 mg via ORAL
  Filled 2022-12-04: qty 1

## 2022-12-04 MED ORDER — HYOSCYAMINE SULFATE 0.125 MG SL SUBL
0.2500 mg | SUBLINGUAL_TABLET | Freq: Once | SUBLINGUAL | Status: AC
  Administered 2022-12-04: 0.25 mg via SUBLINGUAL
  Filled 2022-12-04: qty 2

## 2022-12-04 MED ORDER — FENTANYL CITRATE (PF) 100 MCG/2ML IJ SOLN
25.0000 ug | Freq: Once | INTRAMUSCULAR | Status: AC
  Administered 2022-12-04: 25 ug via INTRAVENOUS

## 2022-12-04 MED ORDER — SERTRALINE HCL 50 MG PO TABS
50.0000 mg | ORAL_TABLET | Freq: Every day | ORAL | Status: DC
  Administered 2022-12-05: 50 mg via ORAL
  Filled 2022-12-04: qty 1

## 2022-12-04 MED ORDER — AMOXICILLIN-POT CLAVULANATE 875-125 MG PO TABS
1.0000 | ORAL_TABLET | Freq: Two times a day (BID) | ORAL | Status: DC
  Administered 2022-12-04 – 2022-12-05 (×2): 1 via ORAL
  Filled 2022-12-04 (×2): qty 1

## 2022-12-04 MED ORDER — AMOXICILLIN-POT CLAVULANATE 875-125 MG PO TABS: 1.0000 | ORAL_TABLET | Freq: Two times a day (BID) | ORAL | 0 refills | Status: AC

## 2022-12-04 MED ORDER — HYDROMORPHONE HCL 1 MG/ML IJ SOLN
0.5000 mg | INTRAMUSCULAR | Status: DC | PRN
  Administered 2022-12-04 – 2022-12-05 (×4): 0.5 mg via INTRAVENOUS
  Filled 2022-12-04 (×4): qty 0.5

## 2022-12-04 MED ORDER — ACETAMINOPHEN 650 MG RE SUPP: 650.0000 mg | Freq: Four times a day (QID) | RECTAL | Status: DC | PRN

## 2022-12-04 MED ORDER — PROPOFOL 500 MG/50ML IV EMUL
INTRAVENOUS | Status: DC | PRN
  Administered 2022-12-04: 150 ug/kg/min via INTRAVENOUS

## 2022-12-04 MED ORDER — POTASSIUM CHLORIDE CRYS ER 20 MEQ PO TBCR
40.0000 meq | EXTENDED_RELEASE_TABLET | ORAL | Status: AC
  Administered 2022-12-04 (×2): 40 meq via ORAL
  Filled 2022-12-04 (×2): qty 2

## 2022-12-04 MED ORDER — SODIUM CHLORIDE 0.9 % IV SOLN: INTRAVENOUS | Status: DC

## 2022-12-04 MED ORDER — FENTANYL CITRATE (PF) 100 MCG/2ML IJ SOLN
INTRAMUSCULAR | Status: AC
  Filled 2022-12-04: qty 2

## 2022-12-04 MED ORDER — LIDOCAINE 2% (20 MG/ML) 5 ML SYRINGE
INTRAMUSCULAR | Status: DC | PRN
  Administered 2022-12-04: 80 mg via INTRAVENOUS

## 2022-12-04 MED ORDER — MAGNESIUM SULFATE 2 GM/50ML IV SOLN
2.0000 g | Freq: Once | INTRAVENOUS | Status: AC
  Administered 2022-12-04: 2 g via INTRAVENOUS
  Filled 2022-12-04: qty 50

## 2022-12-04 MED ORDER — PROPOFOL 10 MG/ML IV BOLUS
INTRAVENOUS | Status: DC | PRN
  Administered 2022-12-04 (×2): 20 mg via INTRAVENOUS

## 2022-12-04 MED ORDER — PANTOPRAZOLE SODIUM 40 MG IV SOLR
40.0000 mg | INTRAVENOUS | Status: DC
  Administered 2022-12-04: 40 mg via INTRAVENOUS
  Filled 2022-12-04: qty 10

## 2022-12-04 MED ORDER — ONDANSETRON HCL 4 MG PO TABS: 4.0000 mg | ORAL_TABLET | Freq: Four times a day (QID) | ORAL | Status: DC | PRN

## 2022-12-04 MED ORDER — LACTATED RINGERS IV SOLN
INTRAVENOUS | Status: DC
  Administered 2022-12-04: 1000 mL via INTRAVENOUS

## 2022-12-04 MED ORDER — LIP MEDEX EX OINT
TOPICAL_OINTMENT | CUTANEOUS | Status: DC | PRN
  Filled 2022-12-04: qty 7

## 2022-12-04 MED ORDER — PROPOFOL 1000 MG/100ML IV EMUL
INTRAVENOUS | Status: AC
  Filled 2022-12-04: qty 100

## 2022-12-04 MED ORDER — ACETAMINOPHEN 10 MG/ML IV SOLN
1000.0000 mg | Freq: Once | INTRAVENOUS | Status: AC
  Administered 2022-12-04: 1000 mg via INTRAVENOUS
  Filled 2022-12-04: qty 100

## 2022-12-04 MED ORDER — SODIUM CHLORIDE 0.9 % IV SOLN
1.5000 g | Freq: Once | INTRAVENOUS | Status: AC
  Administered 2022-12-04: 1.5 g via INTRAVENOUS
  Filled 2022-12-04: qty 4

## 2022-12-04 MED ORDER — ONDANSETRON HCL 4 MG/2ML IJ SOLN: 4.0000 mg | Freq: Four times a day (QID) | INTRAMUSCULAR | Status: DC | PRN

## 2022-12-04 MED ORDER — HYOSCYAMINE SULFATE 0.125 MG SL SUBL
0.2500 mg | SUBLINGUAL_TABLET | Freq: Four times a day (QID) | SUBLINGUAL | Status: DC | PRN
  Administered 2022-12-05: 0.25 mg via SUBLINGUAL
  Filled 2022-12-04 (×2): qty 2

## 2022-12-04 NOTE — Progress Notes (Signed)
Pt returned from procedure complaining of 5/10 pain. Described as acute pain around right abdomen and to back. Given warm blanket to splint. Dr Meridee Score informed by Janee Morn, P and Fentanyl ordered. Same administered. Pt had been slightly bradycardic prior to administration, but dropped to 39bpm post IV administration. Dr Nance Pew aware. IV Tylenol ordered for pain. Reviewed for heart rate at bedside. Complained of 10/10 pain at 0909hrs with Dr Meridee Score present. Denied back pain during this assessment. X-ray ordered. Same completed and reviewed by Dr Meridee Score. EKG ordered and reviewed by Dr Meridee Score. Awaiting review by anesthesia. Pt for GI cocktail, can take sips H20 as tolerated. Pt appears comfortable on last assessment, continues to rate pain 7/10 in epigastric area. Husband at bedside.

## 2022-12-04 NOTE — H&P (Signed)
GASTROENTEROLOGY PROCEDURE H&P NOTE   Primary Care Physician: Kristian Covey, MD  HPI: Rebecca Reid is a 77 y.o. female who presents for EUS to evaluate pancreatic v hepatobiliary cyst >4 cm.    Past Medical History:  Diagnosis Date   ALLERGIC RHINITIS CAUSE UNSPECIFIED 04/30/2009   Anxiety    Arthritis    DEPRESSION, HX OF 04/11/2010   Dysuria 05/06/2010   Gallstones    GERD 04/30/2009   HYPERLIPIDEMIA 04/30/2009   HYPERTENSION 04/30/2009   Lactose intolerance    LEG EDEMA 12/12/2009   OSTEOPENIA 04/30/2009   PVC (premature ventricular contraction)    Status post dilation of esophageal narrowing    Past Surgical History:  Procedure Laterality Date   ABDOMINAL HYSTERECTOMY  1986   AUGMENTATION MAMMAPLASTY Bilateral    BREAST ENHANCEMENT SURGERY  1987   BREAST SURGERY  1974   biopsy   CARDIAC CATHETERIZATION N/A 11/02/2015   Procedure: Left Heart Cath and Coronary Angiography;  Surgeon: Lyn Records, MD;  Location: [facility] INVASIVE CV LAB;  Service: Cardiovascular;  Laterality: N/A;   Carpel tunel Right    CATARACT EXTRACTION, BILATERAL Bilateral    one in feb and one in March Dr. Darel Hong   CHOLECYSTECTOMY  1974   ectopic pregnancy     finger surgery  2019   Dupuytren's contracture surgery   OTHER SURGICAL HISTORY     Stomach was not developed at birth   Current Facility-Administered Medications  Medication Dose Route Frequency Provider Last Rate Last Admin   0.9 %  sodium chloride infusion   Intravenous Continuous Mansouraty, Netty Starring., MD       lactated ringers infusion   Intravenous Continuous Mansouraty, Netty Starring., MD 10 mL/hr at 12/04/22 0656 1,000 mL at 12/04/22 0656    Current Facility-Administered Medications:    0.9 %  sodium chloride infusion, , Intravenous, Continuous, Mansouraty, Netty Starring., MD   lactated ringers infusion, , Intravenous, Continuous, Mansouraty, Netty Starring., MD, Last Rate: 10 mL/hr at 12/04/22 0656, 1,000 mL at 12/04/22  0656 Allergies  Allergen Reactions   Doxycycline Nausea And Vomiting   Erythromycin Base Other (See Comments)    yeast infection, GI upset   Lactose Intolerance (Gi) Diarrhea    Upset stomach    Metronidazole Diarrhea, Itching and Nausea Only   Naproxen Other (See Comments)    GI upset   Prednisone Hives    anxiety, insomnia   Scallops [Shellfish Allergy] Nausea And Vomiting   Family History  Problem Relation Age of Onset   Emphysema Mother        heavy smoker   Heart disease Mother 68   Hyperlipidemia Mother    Hypertension Mother    Skin cancer Mother        melanomia, spread to her spine   Arthritis Mother    Kidney cancer Mother    Heart disease Father 39   Prostate cancer Father    Hyperlipidemia Father    Hypertension Father    Colon cancer Neg Hx    Esophageal cancer Neg Hx    Rectal cancer Neg Hx    Breast cancer Neg Hx    Pancreatic cancer Neg Hx    Social History   Socioeconomic History   Marital status: Married    Spouse name: Not on file   Number of children: 3   Years of education: 12    Highest education level: Some college, no degree  Occupational History   Occupation: retired  Tobacco  Use   Smoking status: Never   Smokeless tobacco: Never  Vaping Use   Vaping Use: Never used  Substance and Sexual Activity   Alcohol use: Not Currently   Drug use: Never   Sexual activity: Not on file  Other Topics Concern   Not on file  Social History Narrative   Married   HH 2   Retired    Chief Executive Officer Determinants of Health   Financial Resource Strain: Patient Declined (09/02/2022)   Overall Financial Resource Strain (CARDIA)    Difficulty of Paying Living Expenses: Patient declined  Food Insecurity: Patient Declined (09/02/2022)   Hunger Vital Sign    Worried About Running Out of Food in the Last Year: Patient declined    Ran Out of Food in the Last Year: Patient declined  Transportation Needs: Patient Declined (09/02/2022)   PRAPARE - Therapist, art (Medical): Patient declined    Lack of Transportation (Non-Medical): Patient declined  Physical Activity: Unknown (09/02/2022)   Exercise Vital Sign    Days of Exercise per Week: Patient declined    Minutes of Exercise per Session: Not on file  Stress: Patient Declined (09/02/2022)   [employer] of Occupational Health - Occupational Stress Questionnaire    Feeling of Stress : Patient declined  Social Connections: Unknown (09/02/2022)   Social Connection and Isolation Panel [NHANES]    Frequency of Communication with Friends and Family: Patient declined    Frequency of Social Gatherings with Friends and Family: Patient declined    Attends Religious Services: Patient declined    Active Member of Clubs or Organizations: Patient declined    Attends Banker Meetings: Not on file    Marital Status: Patient declined  Intimate Partner Violence: Not At Risk (09/23/2021)   Humiliation, Afraid, Rape, and Kick questionnaire    Fear of Current or Ex-Partner: No    Emotionally Abused: No    Physically Abused: No    Sexually Abused: No    Physical Exam: Today's Vitals   12/04/22 0631  BP: (!) 159/77  Pulse: 64  Resp: 20  Temp: (!) 97.5 F (36.4 C)  TempSrc: Tympanic  SpO2: 100%  Weight: 80.3 kg  Height: 5\' 6"  (1.676 m)  PainSc: 0-No pain   Body mass index is 28.57 kg/m. GEN: NAD EYE: Sclerae anicteric ENT: MMM CV: Non-tachycardic GI: Soft, NT/ND NEURO:  Alert & Oriented x 3  Lab Results: No results for input(s): "WBC", "HGB", "HCT", "PLT" in the last 72 hours. BMET No results for input(s): "NA", "K", "CL", "CO2", "GLUCOSE", "BUN", "CREATININE", "CALCIUM" in the last 72 hours. LFT No results for input(s): "PROT", "ALBUMIN", "AST", "ALT", "ALKPHOS", "BILITOT", "BILIDIR", "IBILI" in the last 72 hours. PT/INR No results for input(s): "LABPROT", "INR" in the last 72 hours.   Impression / Plan: This is a 77 y.o.female who presents for EUS to  evaluate pancreatic v hepatobiliary cyst >4 cm.    The risks of an EUS including intestinal perforation, bleeding, infection, aspiration, and medication effects were discussed as was the possibility it may not give a definitive diagnosis if a biopsy is performed.  When a biopsy of the pancreas is done as part of the EUS, there is an additional risk of pancreatitis at the rate of about 1-2%.  It was explained that procedure related pancreatitis is typically mild, although it can be severe and even life threatening, which is why we do not perform random pancreatic biopsies and only biopsy a lesion/area  we feel is concerning enough to warrant the risk.   The risks and benefits of endoscopic evaluation/treatment were discussed with the patient and/or family; these include but are not limited to the risk of perforation, infection, bleeding, missed lesions, lack of diagnosis, severe illness requiring hospitalization, as well as anesthesia and sedation related illnesses.  The patient's history has been reviewed, patient examined, no change in status, and deemed stable for procedure.  The patient and/or family is agreeable to proceed.    Corliss Parish, MD [facility]oscopy Office # [phone]

## 2022-12-04 NOTE — Op Note (Addendum)
[facility] Patient Name: Rebecca Reid Procedure Date: 12/04/2022 MRN: [id number] Attending MD: Corliss Parish , MD, [id number] Date of Birth: 1946/03/20 CSN: [id number] Age: 77 Admit Type: Outpatient Procedure:                Upper EUS Indications:              Pancreatic cyst on MRCP, Cyst seen on MRCP Providers:                Corliss Parish, MD, Norman Clay, RN, Harrington Challenger,                            Technician Referring MD:             Starr Lake. Myrtie Neither, MD, Kristian Covey Medicines:                Monitored Anesthesia Care, Unasyn 1.5 g IV Complications:            No immediate complications. Estimated Blood Loss:     Estimated blood loss was minimal. Procedure:                Pre-Anesthesia Assessment:                           - Prior to the procedure, a History and Physical                            was performed, and patient medications and                            allergies were reviewed. The patient's tolerance of                            previous anesthesia was also reviewed. The risks                            and benefits of the procedure and the sedation                            options and risks were discussed with the patient.                            All questions were answered, and informed consent                            was obtained. Prior Anticoagulants: The patient has                            taken no anticoagulant or antiplatelet agents. ASA                            Grade Assessment: II - A patient with mild systemic                            disease. After reviewing the risks and benefits,  the patient was deemed in satisfactory condition to                            undergo the procedure.                           After obtaining informed consent, the endoscope was                            passed under direct vision. Throughout the                            procedure, the patient's  blood pressure, pulse, and                            oxygen saturations were monitored continuously. The                            [id number]-[id number] ([id number]) Olympus endoscope was introduced                            through the mouth, and advanced to the second part                            of duodenum. The [id number]-[id number] ([id number]) Olympus                            duodenoscope was introduced through the mouth, and                            advanced to the area of papilla. The [id number]                            ([id number]) Olympus linear ultrasound scope was                            introduced through the mouth, and advanced to the                            duodenum for ultrasound examination from the                            stomach and duodenum. The upper EUS was                            accomplished without difficulty. The patient                            tolerated the procedure. Scope In: Scope Out: Findings:      ENDOSCOPIC FINDING: :      No gross lesions were noted in the proximal esophagus and in the mid       esophagus.      One tongue of salmon-colored mucosa was present from 33 to 35 cm and  scattered islands of salmon-colored mucosa were present from 33 to 35       cm. No other visible abnormalities were present. Biopsies were taken       with a cold forceps for histology to rule out Barrett's.      The Z-line was irregular and was found 35 cm from the incisors.      A 3 cm hiatal hernia was present.      Striped moderately erythematous mucosa was found in the gastric body and       in the gastric antrum.      No other gross lesions were noted in the entire examined stomach.       Biopsies were taken with a cold forceps for histology and Helicobacter       pylori testing.      No gross lesions were noted in the duodenal bulb, in the first portion       of the duodenum and in the second portion of the duodenum.      The major papilla was normal.       ENDOSONOGRAPHIC FINDING: :      An anechoic lesion suggestive of a cyst was identified in the pancreatic       head/genu of the pancreas. It shows communication with the pancreatic       duct within the head. The lesion measured 57 mm by 38 mm in maximal       cross-sectional diameter. There was a single compartment thinly       septated. The outer wall of the lesion was thin. There was no associated       mass. There was no internal debris within the fluid-filled cavity.       Diagnostic needle aspiration for fluid was performed. Color Doppler       imaging was utilized prior to needle puncture to confirm a lack of       significant vascular structures within the needle path. One pass was       made with the [facility] Scientific expect 22 gauge needle using a       transduodenal approach. A stylet was used initially. The amount of fluid       collected was 70 mL. The fluid was clear, white and slightly viscous.       Sample(s) were sent for glucose, amylase concentration, cytology, and       CEA (GNAS/KRAS to be done if CEA greater than 192).      The pancreatic duct had a prominence and mild dilation       endosonographically (before the sampling) in the pancreatic head (3.4       mm), genu of the pancreas (3.1 mm), body of the pancreas (2.6 mm) and       tail of the pancreas (2.3 mm). However after the cyst aspiration was       completed, the pancreas duct became less prominent and no longer was as       dilated. This suggests as would be expected communication with the PD       from the pancreas cyst.      Pancreatic parenchymal abnormalities were noted in the entire pancreas.       These consisted of atrophy.      Endosonographic imaging in the common bile duct noted some dilation up       to 12 mm without stones.      Endosonographic imaging of the ampulla  showed no intramural       (subepithelial) lesion.      A cyst was found in the visualized portion of the liver and measured 11        mm by 5 mm in maximal cross-sectional diameter. The cyst was anechoic.       It was thinly septated. The outer wall of the lesion was not seen.      Endosonographic imaging in the visualized portion of the rest of the       visualized liver showed no mass.      No malignant-appearing lymph nodes were visualized in the celiac region       (level 20), peripancreatic region and porta hepatis region.      The celiac region was visualized. Impression:               EGD impression:                           - No gross lesions in the proximal esophagus and in                            the mid esophagus.                           - Salmon-colored mucosa suspicious for                            short-segment Barrett's esophagus. Biopsied.                           - Z-line irregular, 35 cm from the incisors.                           - 3 cm hiatal hernia.                           - Erythematous mucosa in the gastric body and                            antrum.                           - No other gross lesions in the entire stomach.                            Biopsied.                           - No gross lesions in the duodenal bulb, in the                            first portion of the duodenum and in the second                            portion of the duodenum.                           - Normal  major papilla.                           EUS impression:                           - A cystic lesion was seen in the pancreatic                            head/genu of the pancreas. Tissue has not been                            obtained. However, the endosonographic appearance                            is suggestive of a intraductal papillary mucinous                            neoplasm. This is more likely a branch duct IPMN if                            fluid analysis shows this more than a mixed though                            there was improvement in the pancreatic duct                             dilation after the cyst had been aspirated. Fine                            needle aspiration for fluid performed for fluid                            analysis as noted above.                           - The pancreatic duct had a dilated endosonographic                            appearance in the pancreatic head, genu of the                            pancreas, body of the pancreas and tail of the                            pancreas. This duct dilation improved after cyst                            aspiration.                           - Pancreatic parenchymal abnormalities consisting                            of  atrophy were noted in the entire pancreas.                           - Common bile duct dilation noted up to 12 mm                            without stones.                           - A cyst was found in the visualized portion of the                            liver and measured 11 mm by 5 mm.                           - No malignant-appearing lymph nodes were                            visualized in the celiac region (level 20),                            peripancreatic region and porta hepatis region. Moderate Sedation:      Not Applicable - Patient had care per Anesthesia. Recommendation:           - The patient will be observed post-procedure,                            until all discharge criteria are met.                           - Discharge patient to home.                           - Patient has a contact number available for                            emergencies. The signs and symptoms of potential                            delayed complications were discussed with the                            patient. Return to normal activities tomorrow.                            Written discharge instructions were provided to the                            patient.                           - Low fat diet.                           - Observe patient's clinical  course.                           -  Await cytology results and await path results.                           - Based on the fluid analysis we will need to                            consider the role of potential surgical evaluation                            versus continued surveillance of what appears to be                            a pancreatic cyst.                           - Augmentin 875 mg twice daily for 3 days to                            decrease post interventional infectious risk.                           - Continue present medications otherwise.                           - The findings and recommendations were discussed                            with the patient.                           - The findings and recommendations were discussed                            with the patient's family. Procedure Code(s):        --- Professional ---                           [phone], Esophagogastroduodenoscopy, flexible,                            transoral; with transendoscopic ultrasound-guided                            intramural or transmural fine needle                            aspiration/biopsy(s), (includes endoscopic                            ultrasound examination limited to the esophagus,                            stomach or duodenum, and adjacent structures)                           43239, 59, Esophagogastroduodenoscopy, flexible,  transoral; with biopsy, single or multiple Diagnosis Code(s):        --- Professional ---                           K22.89, Other specified disease of esophagus                           K44.9, Diaphragmatic hernia without obstruction or                            gangrene                           K31.89, Other diseases of stomach and duodenum                           K86.2, Cyst of pancreas                           K86.9, Disease of pancreas, unspecified                           K76.89, Other specified  diseases of liver                           I89.9, Noninfective disorder of lymphatic vessels                            and lymph nodes, unspecified                           R93.3, Abnormal findings on diagnostic imaging of                            other parts of digestive tract                           R93.2, Abnormal findings on diagnostic imaging of                            liver and biliary tract CPT copyright 2022 [employer]. All rights reserved. The codes documented in this report are preliminary and upon coder review may  be revised to meet current compliance requirements. Corliss Parish, MD 12/04/2022 9:13:09 AM Number of Addenda: 0

## 2022-12-04 NOTE — H&P (Signed)
History and Physical    Patient: Rebecca Reid:[id number] DOB: [birth year] DOA: 12/04/2022 DOS: the patient was seen and examined on 12/04/2022 PCP: Kristian Covey, MD  Patient coming from: Home  Chief Complaint: Abdominal and chest pain.  HPI: Rebecca Reid is a 77 y.o. female with medical history significant of seasonal allergies, anxiety, depression, osteoarthritis, dysuria, gallstones, GERD, history of esophageal narrowing dilatation, hyperlipidemia, hypertension, lactose intolerance, bilateral lower extremity edema, osteopenia, history of PVCs who developed abdominal and chest pain following upper EUS for evaluation of pancreatic cyst.  She received a dose of Unasyn 3 g IVPB, 30 mL of Maalox p.o., 1000 mg of acetaminophen IVPB, 3 doses of fentanyl 25 mcg and hyoscyamine 0.25 mg sublingual with some for the pain, but felt better after she was given hydromorphone.  No fever, chills or night sweats. No sore throat, rhinorrhea, dyspnea, wheezing or hemoptysis.  No further chest or abdominal pain at the time of my examination.  Lab work: CBC showed a white count of 6.4, hemoglobin 11.5 g/dL and platelets 811. Normal CRP, troponin, lipase, malaise, phosphorus and magnesium.  CMP showed a potassium of 3.0 mmol/L and a glucose of 126 mg/dL.  The rest of the CMP measurements were normal.  Imaging: No free air seen on abdominal x-rays.  Portable 1 view chest radiograph x 2 unremarkable.  Review of Systems: As mentioned in the history of present illness. All other systems reviewed and are negative. Past Medical History:  Diagnosis Date   ALLERGIC RHINITIS CAUSE UNSPECIFIED 04/30/2009   Anxiety    Arthritis    DEPRESSION, HX OF 04/11/2010   Dysuria 05/06/2010   Gallstones    GERD 04/30/2009   HYPERLIPIDEMIA 04/30/2009   HYPERTENSION 04/30/2009   Lactose intolerance    LEG EDEMA 12/12/2009   OSTEOPENIA 04/30/2009   PVC (premature ventricular contraction)    Status post dilation of  esophageal narrowing    Past Surgical History:  Procedure Laterality Date   ABDOMINAL HYSTERECTOMY  1986   AUGMENTATION MAMMAPLASTY Bilateral    BREAST ENHANCEMENT SURGERY  1987   BREAST SURGERY  1974   biopsy   CARDIAC CATHETERIZATION N/A 11/02/2015   Procedure: Left Heart Cath and Coronary Angiography;  Surgeon: Lyn Records, MD;  Location: [facility] INVASIVE CV LAB;  Service: Cardiovascular;  Laterality: N/A;   Carpel tunel Right    CATARACT EXTRACTION, BILATERAL Bilateral    one in feb and one in March Dr. Darel Hong   CHOLECYSTECTOMY  1974   ectopic pregnancy     finger surgery  2019   Dupuytren's contracture surgery   OTHER SURGICAL HISTORY     Stomach was not developed at birth   Social History:  reports that she has never smoked. She has never used smokeless tobacco. She reports that she does not currently use alcohol. She reports that she does not use drugs.  Allergies  Allergen Reactions   Doxycycline Nausea And Vomiting   Erythromycin Base Other (See Comments)    yeast infection, GI upset   Lactose Intolerance (Gi) Diarrhea    Upset stomach    Metronidazole Diarrhea, Itching and Nausea Only   Naproxen Other (See Comments)    GI upset   Prednisone Hives    anxiety, insomnia   Scallops [Shellfish Allergy] Nausea And Vomiting    Family History  Problem Relation Age of Onset   Emphysema Mother        heavy smoker   Heart disease Mother 43   Hyperlipidemia  Mother    Hypertension Mother    Skin cancer Mother        melanomia, spread to her spine   Arthritis Mother    Kidney cancer Mother    Heart disease Father 85   Prostate cancer Father    Hyperlipidemia Father    Hypertension Father    Colon cancer Neg Hx    Esophageal cancer Neg Hx    Rectal cancer Neg Hx    Breast cancer Neg Hx    Pancreatic cancer Neg Hx     Prior to Admission medications   Medication Sig Start Date End Date Taking? Authorizing Provider  acetaminophen (TYLENOL) 650 MG CR tablet Take  650-1,300 mg by mouth every 8 (eight) hours as needed for pain.   Yes [provider]  amLODipine (NORVASC) 5 MG tablet Take 1 tablet (5 mg total) by mouth daily. 04/07/22  Yes Burchette, Elberta Fortis, MD  Azelastine HCl 137 MCG/SPRAY SOLN PLACE 2 SPRAYS INTO BOTH NOSTRILS 2 TIMES DAILY - USE IN EACH NOSTRIL AS DIRECTED Patient taking differently: Place 2 sprays into both nostrils 2 (two) times daily as needed (allergies). 10/03/22  Yes Burchette, Elberta Fortis, MD  cetirizine (ZYRTEC) 10 MG tablet Take 10 mg by mouth daily.   Yes [provider]  losartan-hydrochlorothiazide (HYZAAR) 100-25 MG tablet TAKE ONE TABLET BY MOUTH ONE TIME DAILY 10/30/22  Yes Burchette, Elberta Fortis, MD  Menthol, Topical Analgesic, (BIOFREEZE EX) Apply 1 Application topically daily as needed (pain).   Yes [provider]  metoprolol succinate (TOPROL-XL) 50 MG 24 hr tablet TAKE ONE TABLET BY MOUTH ONE TIME DAILY WITH OR IMMEDIATELY AFTER A MEAL 10/16/22  Yes Burchette, Elberta Fortis, MD  omeprazole (PRILOSEC OTC) 20 MG tablet Take 20 mg by mouth See admin instructions. Take 20 mg daily, may increase to 40 mg as needed for acid reflux   Yes [provider]  sertraline (ZOLOFT) 50 MG tablet TAKE ONE TABLET BY MOUTH ONE TIME DAILY 03/28/22  Yes Burchette, Elberta Fortis, MD  traMADol (ULTRAM) 50 MG tablet TAKE ONE TABLET BY MOUTH EVERY 4 HOURS AS NEEDED FOR UP TO 5 DAYS 04/07/22  Yes Burchette, Elberta Fortis, MD  TURMERIC PO Take 1 tablet by mouth 2 (two) times daily.   Yes [provider]  amoxicillin-clavulanate (AUGMENTIN) 875-125 MG tablet Take 1 tablet by mouth 2 (two) times daily for 3 days. 12/04/22 12/07/22  Mansouraty, Netty Starring., MD  benzonatate (TESSALON) 200 MG capsule Take 1 capsule (200 mg total) by mouth 2 (two) times daily as needed for cough. Patient not taking: Reported on 12/02/2022 10/24/22   Felix Pacini A, DO  montelukast (SINGULAIR) 10 MG tablet Take 1 tablet (10 mg total) by mouth at bedtime. Patient  not taking: Reported on 12/02/2022 10/24/22   Natalia Leatherwood, DO    Physical Exam: Vitals:   12/04/22 0930 12/04/22 0940 12/04/22 0957 12/04/22 1010  BP:   (!) 125/55 (!) 131/57  Pulse: (!) 59 71    Resp: 15 16    Temp:   (!) 97.3 F (36.3 C)   TempSrc:   Temporal   SpO2: 100% 96%    Weight:      Height:       Physical Exam Vitals and nursing note reviewed.  Constitutional:      General: She is awake. She is not in acute distress.    Appearance: Normal appearance. She is overweight.  HENT:     Head: Normocephalic.  Nose: No rhinorrhea.     Mouth/Throat:     Mouth: Mucous membranes are moist.  Eyes:     General: No scleral icterus.    Pupils: Pupils are equal, round, and reactive to light.  Neck:     Vascular: No JVD.  Cardiovascular:     Rate and Rhythm: Normal rate and regular rhythm.     Heart sounds: S1 normal and S2 normal.  Pulmonary:     Effort: Pulmonary effort is normal.     Breath sounds: Normal breath sounds.  Abdominal:     General: Bowel sounds are normal.     Palpations: Abdomen is soft.     Tenderness: There is abdominal tenderness in the epigastric area. There is no right CVA tenderness, left CVA tenderness, guarding or rebound.  Musculoskeletal:     Cervical back: Neck supple.     Right lower leg: No edema.     Left lower leg: No edema.  Skin:    General: Skin is warm and dry.  Neurological:     General: No focal deficit present.     Mental Status: She is alert and oriented to person, place, and time.  Psychiatric:        Mood and Affect: Mood normal.        Behavior: Behavior normal. Behavior is cooperative.     Data Reviewed:  There are no new results to review at this time.  Assessment and Plan: Principal Problem:   Chest pain Postprocedure/atypical. Status post open her EUS for   Pancreatic cyst No schemic EKG changes. Troponin level is normal. Telemetry/observation. Analgesics as needed. Antiemetics as  needed. Gastroenterology input appreciated. Follow-up pathology, CA 19-9. Repeat lipase, CBC and CMP in AM.  Active Problems:   Essential hypertension Continue amlodipine 5 mg p.o. daily. Continue metoprolol 50 mg p.o. daily.    GERD Continue pantoprazole 40 mg IVP daily.    Hypokalemia Replacing. Magnesium was supplemented. Follow-up level in the morning.    Normocytic anemia Monitor hematocrit and hemoglobin.    Depression with anxiety Continue sertraline 50 mg p.o. daily.     Advance Care Planning:   Code Status: Full Code   Consults:   Family Communication:   Severity of Illness: The appropriate patient status for this patient is INPATIENT. Inpatient status is judged to be reasonable and necessary in order to provide the required intensity of service to ensure the patient's safety. The patient's presenting symptoms, physical exam findings, and initial radiographic and laboratory data in the context of their chronic comorbidities is felt to place them at high risk for further clinical deterioration. Furthermore, it is not anticipated that the patient will be medically stable for discharge from the hospital within 2 midnights of admission.   * I certify that at the point of admission it is my clinical judgment that the patient will require inpatient hospital care spanning beyond 2 midnights from the point of admission due to high intensity of service, high risk for further deterioration and high frequency of surveillance required.*  Author: Bobette Mo, MD 12/04/2022 10:35 AM  For on call review [email or web address].   This document was prepared using Dragon voice recognition software and may contain some unintended transcription errors.

## 2022-12-04 NOTE — Transfer of Care (Signed)
Immediate Anesthesia Transfer of Care Note  Patient: ERYN MITROVICH  Procedure(s) Performed: UPPER ENDOSCOPIC ULTRASOUND (EUS) RADIAL ESOPHAGOGASTRODUODENOSCOPY (EGD) BIOPSY FINE NEEDLE ASPIRATION (FNA) LINEAR  Patient Location: Endoscopy Unit  Anesthesia Type:MAC  Level of Consciousness: drowsy and patient cooperative  Airway & Oxygen Therapy: Patient Spontanous Breathing and Patient connected to face mask oxygen  Post-op Assessment: Report given to RN and Post -op Vital signs reviewed and stable  Post vital signs: Reviewed and stable  Last Vitals:  Vitals Value Taken Time  BP 153/71   Temp    Pulse 59   Resp 14   SpO2 99%     Last Pain:  Vitals:   12/04/22 0631  TempSrc: Tympanic  PainSc: 0-No pain         Complications: No notable events documented.

## 2022-12-04 NOTE — Progress Notes (Addendum)
Progress Note  Primary GI: Dr. Myrtie Neither   Subjective  Chief Complaint: Pancreatic cyst  Patient with family at bedside, her husband Cindee Lame. Provided some of the history.  Patient status post EUS with pancreatic cyst aspiration with Dr. Meridee Score today.  Patient had postprocedural epigastric pain.  States never had anything like before, substernal pressure radiating up, some discomfort in her back.  Denies any associated nausea, vomiting, shortness of breath, diaphoresis. Received IVF, fentanyl and GI cocktail.   Patient currently lying in bed, no acute distress, drinking coffee. States the pain has subsided significantly.  Still has some discomfort when she moves around. Had some oxygen desaturations postoperatively, currently on O2 2 L, not on oxygen at home. Denies SOB at this time. Had bowel movement postoperatively, denies melena or hematochezia.   Objective   Vital signs in last 24 hours: Temp:  [97.3 F (36.3 C)-97.6 F (36.4 C)] 97.4 F (36.3 C) (05/16 1237) Pulse Rate:  [39-82] 65 (05/16 1237) Resp:  [11-26] 18 (05/16 1237) BP: (95-159)/(37-94) 149/64 (05/16 1237) SpO2:  [90 %-100 %] 100 % (05/16 1237) Weight:  [80.3 kg] 80.3 kg (05/16 0631) Last BM Date : 12/04/22 Last BM recorded by nurses in past 5 days No data recorded  General:   female in no acute distress  Heart:  Regular rate and rhythm; no murmurs, no chest wall pain to palpation Pulm: Clear anteriorly; no wheezing Abdomen: Non-distended AB, Active bowel sounds. No tenderness . Without guarding and Without rebound, No organomegaly appreciated. Extremities:  without  edema. Neurologic:  Alert and  oriented x4;  No focal deficits.  Psych:  Cooperative. Normal mood and affect.  Intake/Output from previous day: No intake/output data recorded. Intake/Output this shift: Total I/O In: 700 [I.V.:600; IV Piggyback:100] Out: -   Studies/Results: DG Abd Portable 1V  Result Date: 12/04/2022 CLINICAL DATA:   Abdominal pain following endoscopic ultrasound. EXAM: PORTABLE ABDOMEN - 1 VIEW, decubitus. COMPARISON:  Acute abdominal series 12/04/2022 FINDINGS: The bowel gas pattern is normal. No radio-opaque calculi or other significant radiographic abnormality are seen. No free air is present on the decubitus view. IMPRESSION: Negative. Electronically Signed   By: Marin Roberts M.D.   On: 12/04/2022 10:11   DG Abd Portable 2V  Result Date: 12/04/2022 CLINICAL DATA:  Abdominal pain following endoscopic ultrasound EXAM: PORTABLE ABDOMEN - 2 VIEW COMPARISON:  CT abdomen 08/29/2022 FINDINGS: The bowel gas pattern is normal. There is no evidence of free air. No radio-opaque calculi or other significant radiographic abnormality is seen. The heart is enlarged. Visualized lung fields are clear. IMPRESSION: 1. Negative abdomen. 2. Cardiomegaly without failure. Electronically Signed   By: Marin Roberts M.D.   On: 12/04/2022 10:10   DG CHEST PORT 1 VIEW  Result Date: 12/04/2022 CLINICAL DATA:  Abdominal pain after EUS. EXAM: PORTABLE CHEST 1 VIEW COMPARISON:  None Available. FINDINGS: The heart is at the upper limits of normal in size. Normal pulmonary vascularity. No focal consolidation, pleural effusion, or pneumothorax. No acute osseous abnormality. IMPRESSION: No active disease. Electronically Signed   By: Obie Dredge M.D.   On: 12/04/2022 10:08    Lab Results: Recent Labs    12/04/22 1124  WBC 6.4  HGB 11.5*  HCT 35.1*  PLT 196   BMET Recent Labs    12/04/22 1124  NA 137  K 3.0*  CL 104  CO2 26  GLUCOSE 126*  BUN 18  CREATININE 0.73  CALCIUM 8.9   LFT Recent Labs  12/04/22 1124  PROT 6.5  ALBUMIN 3.7  AST 25  ALT 18  ALKPHOS 66  BILITOT 0.6   PT/INR No results for input(s): "LABPROT", "INR" in the last 72 hours.   Scheduled Meds:  amoxicillin-clavulanate  1 tablet Oral Q12H   Continuous Infusions:  sodium chloride     lactated ringers     lactated ringers 200  mL/hr at 12/04/22 1049      Patient profile:   72 female with history of 5.7 cm cystic lesion pancreas head  09/25/2022 MR abdomen without contrast 5.7 cystic lesion posterior margin pancreatic head and CBD possible communication distal CBD, mild diffuse biliary ductal dilatation, without evidence of choledocholithiasis.   Impression/Plan:   Pancreatic head pseudocyst 5.7 cm with mild diffuse biliary ductal dilatation possible communication CBD 12/05/2022 EUS with Dr. Meridee Score showed an EGD salmon-colored mucosa suspicious for short segment Barrett's, 3 cm hiatal hernia, erythematous mucosa gastric body and antrum biopsied normal duodenum.  EUS showed cystic lesion pancreatic head endoscopic appearance suggestive of intraductal papillary mucinous neoplasm FNA performed.  Pancreatic duct dilation improved after cyst aspiration.  Pancreatic parenchymal abnormalities consistent atrophy noted entire pancreas.  CBD dilation noted up to 12 mm without stones.  No malignant appearing lymph nodes were visualized in the celiac region peripancreatic region reported hepatitis region. Post procedurally patient began to have new onset upper abdominal/epigastric and chest discomfort. -Clinical presentation afterwards most concerning for pancreatitis, perforation, ACS, spasm, GERD Initial chest x-ray and KUB is negative for free air or perforation. Initial CBC without leukocytosis Lipase 36, amylase 59 Troponin negative  - continue supportive care with IVF, pain control per primary -Pending repeat KUB and chest x-ray at 3 PM -Clear liquid diet for now, if repeat x-rays unremarkable at 3 PM can increase to full liquid diet and advance as tolerated -if patient continues to have escalation of pain despite fentanyl/GI cocktails consider CT chest abdomen pelvis -Pending pathology -Pending CA 19-9 - pending repeat lipase, CBC, CMET in the AM  Normocytic anemia Hgb 11.5, MCV 85.4, baseline appears to be 13 3  months ago Likely dilutional, monitor for GI bleeding  GERD On outpatient omeprazole with some breath through Continue pantoprazole 40 mg daily  Postprocedural oxygen desaturations Normal CXR Can likely stop at this time and monitor  Principal Problem:   Chest pain    LOS: 0 days   Doree Albee  12/04/2022, 12:49 PM  GI ATTENDING  As above, continue with overnight observation.  Glad that she is doing better.  Repeat abdominal films this afternoon completed with reading pending.  Wilhemina Bonito. Eda Keys., M.D. [facility] of Gastroenterology

## 2022-12-04 NOTE — Progress Notes (Addendum)
Patient seen in postprocedure PACU. She states she is having upper abdominal/epigastric/chest pain.  She did not have this prior to the procedure. Her procedure was overall unremarkable in regards to overall complexity as documented in her procedure note on [facility]. Differential diagnosis would be perforation versus pancreatitis, more likely pancreatitis based on is having sampled the pancreas cyst.  Though she did have a hiatal hernia that this was easily traversed with all 3 endoscope is used today. Chest x-ray and KUB's will be obtained. Fentanyl will be administered. We will reevaluate where things are. Patient's husband brought up-to-date.   Corliss Parish, MD [facility] Office # [phone]  9:45 AM addendum I have looked at the x-rays and wet read and do not see any overt abnormality.  I had them reposition her because it looks like her breast implants may be causing some mild air-fluid levels which look to have improved on the KUB images.  Will await radiology evaluation. Patient's husband has been updated. Patient's discomfort down to 7 out of 10 currently. EKG to be obtained. Will have anesthesia follow-up this EKG as well.   Corliss Parish, MD [facility] Office # [phone]    10:20 AM addendum Radiology does not see any evidence of free air on KUB/chest x-ray. Will allow patient to have sips of clear liquids. Will give GI cocktail x 1. Will reach out to [facility] hospital team in regards to evaluating patient for admission/observation at least overnight since she is continuing to have persistent pain. Would recommend in 2 to 3 hours if patient still having significant pain that is not controlled, repeating chest x-ray/KUB to ensure no new evidence of free air has developed. Otherwise continue to treat as likely postprocedural pancreatitis.  Corliss Parish, MD [facility]  [facility]oscopy Office # [phone]   [phone] Addendum Patient reevaluated.  She is having improvement in her discomfort.  She has required over the course of this morning a total of 3 doses of IV fentanyl 25 mcg and 1 dose of IV Tylenol. Her labs are still pending at this point.  We are treating her as postprocedural pancreatitis.  I recommend repeat x-rays to be performed this afternoon. I will have my inpatient GI team follow-up see how the patient is doing. Hopefully the patient is doing well. She will be on clear liquids until the follow-up x-rays are completed.  If she is doing well she can tolerate a full liquid diet thereafter. A GI cocktail that can be used up to 4 times daily has been ordered as needed. Appreciate medicine service evaluating and bringing the patient in for observation. Our inpatient GI team will follow while she is in. If her pain progresses or worsens significantly, she will need potential CT chest/abdomen/pelvis to be performed and I would do that with oral contrast and IV contrast.   Corliss Parish, MD [facility] Advanced Endoscopy Office # [phone]

## 2022-12-04 NOTE — Progress Notes (Signed)
Pt given additional dose of Fentanyl for continued 7/10 epigastric pain despite taking GI cocktail. Order given by Dr Meridee Score so long as vitals stable. HR and BP stable, so dose given, awaiting effect. Pt resting, appears comfortable. Assisted to bathroom and voided urine and passed stool. Husband at bedside. Awaiting phlebotomy and ward bed.

## 2022-12-04 NOTE — Plan of Care (Signed)

## 2022-12-05 ENCOUNTER — Observation Stay (HOSPITAL_COMMUNITY): Payer: Medicare HMO

## 2022-12-05 ENCOUNTER — Encounter: Payer: Self-pay | Admitting: Gastroenterology

## 2022-12-05 DIAGNOSIS — K869 Disease of pancreas, unspecified: Secondary | ICD-10-CM | POA: Diagnosis not present

## 2022-12-05 DIAGNOSIS — K862 Cyst of pancreas: Secondary | ICD-10-CM

## 2022-12-05 DIAGNOSIS — I10 Essential (primary) hypertension: Secondary | ICD-10-CM

## 2022-12-05 DIAGNOSIS — R0789 Other chest pain: Secondary | ICD-10-CM | POA: Diagnosis not present

## 2022-12-05 DIAGNOSIS — R932 Abnormal findings on diagnostic imaging of liver and biliary tract: Secondary | ICD-10-CM | POA: Diagnosis not present

## 2022-12-05 DIAGNOSIS — K219 Gastro-esophageal reflux disease without esophagitis: Secondary | ICD-10-CM

## 2022-12-05 DIAGNOSIS — R935 Abnormal findings on diagnostic imaging of other abdominal regions, including retroperitoneum: Secondary | ICD-10-CM

## 2022-12-05 DIAGNOSIS — K3189 Other diseases of stomach and duodenum: Secondary | ICD-10-CM | POA: Diagnosis not present

## 2022-12-05 DIAGNOSIS — K7689 Other specified diseases of liver: Secondary | ICD-10-CM | POA: Diagnosis not present

## 2022-12-05 DIAGNOSIS — F418 Other specified anxiety disorders: Secondary | ICD-10-CM | POA: Diagnosis not present

## 2022-12-05 DIAGNOSIS — I899 Noninfective disorder of lymphatic vessels and lymph nodes, unspecified: Secondary | ICD-10-CM | POA: Diagnosis not present

## 2022-12-05 DIAGNOSIS — E876 Hypokalemia: Secondary | ICD-10-CM

## 2022-12-05 DIAGNOSIS — D649 Anemia, unspecified: Secondary | ICD-10-CM | POA: Diagnosis not present

## 2022-12-05 DIAGNOSIS — K449 Diaphragmatic hernia without obstruction or gangrene: Secondary | ICD-10-CM | POA: Diagnosis not present

## 2022-12-05 DIAGNOSIS — R933 Abnormal findings on diagnostic imaging of other parts of digestive tract: Secondary | ICD-10-CM | POA: Diagnosis not present

## 2022-12-05 DIAGNOSIS — R1013 Epigastric pain: Secondary | ICD-10-CM | POA: Diagnosis not present

## 2022-12-05 DIAGNOSIS — Z79899 Other long term (current) drug therapy: Secondary | ICD-10-CM | POA: Diagnosis not present

## 2022-12-05 DIAGNOSIS — K2289 Other specified disease of esophagus: Secondary | ICD-10-CM | POA: Diagnosis not present

## 2022-12-05 LAB — CBC
HCT: 36.4 % (ref 36.0–46.0)
Hemoglobin: 11.8 g/dL — ABNORMAL LOW (ref 12.0–15.0)
MCH: 28 pg (ref 26.0–34.0)
MCHC: 32.4 g/dL (ref 30.0–36.0)
MCV: 86.5 fL (ref 80.0–100.0)
Platelets: 197 10*3/uL (ref 150–400)
RBC: 4.21 MIL/uL (ref 3.87–5.11)
RDW: 14 % (ref 11.5–15.5)
WBC: 9.8 10*3/uL (ref 4.0–10.5)
nRBC: 0 % (ref 0.0–0.2)

## 2022-12-05 LAB — COMPREHENSIVE METABOLIC PANEL
ALT: 18 U/L (ref 0–44)
AST: 23 U/L (ref 15–41)
Albumin: 3.2 g/dL — ABNORMAL LOW (ref 3.5–5.0)
Alkaline Phosphatase: 59 U/L (ref 38–126)
Anion gap: 5 (ref 5–15)
BUN: 8 mg/dL (ref 8–23)
CO2: 27 mmol/L (ref 22–32)
Calcium: 8.9 mg/dL (ref 8.9–10.3)
Chloride: 104 mmol/L (ref 98–111)
Creatinine, Ser: 0.59 mg/dL (ref 0.44–1.00)
GFR, Estimated: >60 mL/min (ref >60)
Glucose, Bld: 109 mg/dL — ABNORMAL HIGH (ref 70–99)
Potassium: 4.8 mmol/L (ref 3.5–5.1)
Sodium: 136 mmol/L (ref 135–145)
Total Bilirubin: 0.9 mg/dL (ref 0.3–1.2)
Total Protein: 5.9 g/dL — ABNORMAL LOW (ref 6.5–8.1)

## 2022-12-05 LAB — SURGICAL PATHOLOGY

## 2022-12-05 LAB — LIPASE, BLOOD: Lipase: 45 U/L (ref 11–51)

## 2022-12-05 LAB — CYTOLOGY - NON PAP

## 2022-12-05 MED ORDER — PANTOPRAZOLE SODIUM 40 MG PO TBEC: 40.0000 mg | DELAYED_RELEASE_TABLET | Freq: Every day | ORAL | Status: DC

## 2022-12-05 MED ORDER — IOHEXOL 9 MG/ML PO SOLN
500.0000 mL | ORAL | Status: AC
  Administered 2022-12-05 (×2): 500 mL via ORAL

## 2022-12-05 MED ORDER — IOHEXOL 9 MG/ML PO SOLN
ORAL | Status: AC
  Filled 2022-12-05: qty 1000

## 2022-12-05 MED ORDER — SODIUM CHLORIDE (PF) 0.9 % IJ SOLN
INTRAMUSCULAR | Status: AC
  Filled 2022-12-05: qty 50

## 2022-12-05 MED ORDER — IOHEXOL 300 MG/ML  SOLN
100.0000 mL | Freq: Once | INTRAMUSCULAR | Status: AC | PRN
  Administered 2022-12-05: 100 mL via INTRAVENOUS

## 2022-12-05 MED ORDER — HYDROMORPHONE HCL 1 MG/ML IJ SOLN
0.5000 mg | Freq: Once | INTRAMUSCULAR | Status: AC
  Administered 2022-12-05: 0.5 mg via INTRAVENOUS
  Filled 2022-12-05: qty 0.5

## 2022-12-05 MED ORDER — SIMETHICONE 80 MG PO CHEW
80.0000 mg | CHEWABLE_TABLET | Freq: Four times a day (QID) | ORAL | Status: DC | PRN
  Administered 2022-12-05 (×2): 80 mg via ORAL
  Filled 2022-12-05 (×2): qty 1

## 2022-12-05 NOTE — Progress Notes (Signed)
  Transition of Care [facility]) Screening Note   Patient Details  Name: Rebecca Reid Date of Birth: [birth year]   Transition of Care [facility]) CM/SW Contact:    Otelia Santee, LCSW Phone Number: 12/05/2022, 9:49 AM    Transition of Care Department [facility]) has reviewed patient and no TOC needs have been identified at this time. We will continue to monitor patient advancement through interdisciplinary progression rounds. If new patient transition needs arise, please place a TOC consult.

## 2022-12-05 NOTE — Discharge Summary (Signed)
Physician Discharge Summary  Rebecca Reid ZOX:[id number] DOB: 27-Apr-1946 DOA: 12/04/2022  PCP: Kristian Covey, MD  Admit date: 12/04/2022 Discharge date: 12/05/2022  Admitted From: Home Disposition: Home  Recommendations for Outpatient Follow-up:  Follow up with PCP in 1-2 weeks Follow-up with GI as scheduled  Home Health: None Equipment/Devices: None  Discharge Condition: Stable CODE STATUS: Full Diet recommendation: Low-salt low-fat diet as tolerated  Brief/Interim Summary: Rebecca Reid is a 77 y.o. female with medical history significant of seasonal allergies, anxiety, depression, osteoarthritis, dysuria, gallstones, GERD, history of esophageal narrowing dilatation, hyperlipidemia, hypertension, lactose intolerance, bilateral lower extremity edema, osteopenia, history of PVCs who developed abdominal and chest pain following upper EUS for evaluation of pancreatic cyst.   Patient admitted overnight in the setting of intractable chest versus abdominal pain after recent procedure.  GI continues to follow initially concerned for possible perforation versus provoked pancreatitis.  Patient's lipase remains normal her symptoms have resolved overnight; patient's initial imaging of the chest and abdomen were unremarkable, her CT shows some peri-pancreatic fluid which is not abnormal post procedure per discussion with GI - no clear signs of perforation, no free air.  Given the above findings resolution of symptoms certainly reasonable for discharge home, outpatient follow-up with GI as scheduled  Discharge Diagnoses:  Principal Problem:   Chest pain Active Problems:   Essential hypertension   GERD   Pancreatic cyst   Hypokalemia   Normocytic anemia   Depression with anxiety    Discharge Instructions   Allergies as of 12/05/2022       Reactions   Doxycycline Nausea And Vomiting   Erythromycin Base Other (See Comments)   yeast infection, GI upset   Lactose Intolerance (gi)  Diarrhea   Upset stomach    Metronidazole Diarrhea, Itching, Nausea Only   Naproxen Other (See Comments)   GI upset   Prednisone Hives   anxiety, insomnia   Scallops [shellfish Allergy] Nausea And Vomiting        Medication List     TAKE these medications    acetaminophen 650 MG CR tablet Commonly known as: TYLENOL Take 650-1,300 mg by mouth every 8 (eight) hours as needed for pain.   amLODipine 5 MG tablet Commonly known as: NORVASC Take 1 tablet (5 mg total) by mouth daily.   amoxicillin-clavulanate 875-125 MG tablet Commonly known as: AUGMENTIN Take 1 tablet by mouth 2 (two) times daily for 3 days.   Azelastine HCl 137 MCG/SPRAY Soln PLACE 2 SPRAYS INTO BOTH NOSTRILS 2 TIMES DAILY - USE IN EACH NOSTRIL AS DIRECTED What changed: See the new instructions.   benzonatate 200 MG capsule Commonly known as: TESSALON Take 1 capsule (200 mg total) by mouth 2 (two) times daily as needed for cough.   BIOFREEZE EX Apply 1 Application topically daily as needed (pain).   cetirizine 10 MG tablet Commonly known as: ZYRTEC Take 10 mg by mouth daily.   losartan-hydrochlorothiazide 100-25 MG tablet Commonly known as: HYZAAR TAKE ONE TABLET BY MOUTH ONE TIME DAILY   metoprolol succinate 50 MG 24 hr tablet Commonly known as: TOPROL-XL TAKE ONE TABLET BY MOUTH ONE TIME DAILY WITH OR IMMEDIATELY AFTER A MEAL   montelukast 10 MG tablet Commonly known as: SINGULAIR Take 1 tablet (10 mg total) by mouth at bedtime.   omeprazole 20 MG tablet Commonly known as: PRILOSEC OTC Take 20 mg by mouth See admin instructions. Take 20 mg daily, may increase to 40 mg as needed for acid reflux  sertraline 50 MG tablet Commonly known as: ZOLOFT TAKE ONE TABLET BY MOUTH ONE TIME DAILY   traMADol 50 MG tablet Commonly known as: ULTRAM TAKE ONE TABLET BY MOUTH EVERY 4 HOURS AS NEEDED FOR UP TO 5 DAYS   TURMERIC PO Take 1 tablet by mouth 2 (two) times daily.        Allergies   Allergen Reactions   Doxycycline Nausea And Vomiting   Erythromycin Base Other (See Comments)    yeast infection, GI upset   Lactose Intolerance (Gi) Diarrhea    Upset stomach    Metronidazole Diarrhea, Itching and Nausea Only   Naproxen Other (See Comments)    GI upset   Prednisone Hives    anxiety, insomnia   Scallops [Shellfish Allergy] Nausea And Vomiting    Consultations: GI  Procedures/Studies: DG Abd Portable 2V  Result Date: 12/04/2022 CLINICAL DATA:  [id number] Chest pain [id number] Abdominal pain [id number] EXAM: PORTABLE ABDOMEN - 2 VIEW COMPARISON:  Same day abdominal radiograph, August 29, 2022 FINDINGS: The bowel gas pattern is normal. There is no evidence of free air. No radio-opaque calculi or other significant radiographic abnormality is seen. Opacity of the LEFT lung base is favored to be due to breast implant shadow given lack of opacity on contemporaneous chest radiograph. IMPRESSION: No radiographic etiology for epigastric pain is identified. Electronically Signed   By: Meda Klinefelter M.D.   On: 12/04/2022 15:59   DG CHEST PORT 1 VIEW  Result Date: 12/04/2022 CLINICAL DATA:  [id number] Chest pain [id number] Abdominal pain [id number] EXAM: PORTABLE CHEST 1 VIEW COMPARISON:  Same day chest radiograph FINDINGS: The cardiomediastinal silhouette is unchanged in contour.Improved aeration of bilateral lung bases. No pleural effusion. No pneumothorax. No acute pleuroparenchymal abnormality. IMPRESSION: No acute cardiopulmonary abnormality. Electronically Signed   By: Meda Klinefelter M.D.   On: 12/04/2022 15:55   DG Abd Portable 1V  Result Date: 12/04/2022 CLINICAL DATA:  Abdominal pain following endoscopic ultrasound. EXAM: PORTABLE ABDOMEN - 1 VIEW, decubitus. COMPARISON:  Acute abdominal series 12/04/2022 FINDINGS: The bowel gas pattern is normal. No radio-opaque calculi or other significant radiographic abnormality are seen. No free air is present on the decubitus view.  IMPRESSION: Negative. Electronically Signed   By: Marin Roberts M.D.   On: 12/04/2022 10:11   DG Abd Portable 2V  Result Date: 12/04/2022 CLINICAL DATA:  Abdominal pain following endoscopic ultrasound EXAM: PORTABLE ABDOMEN - 2 VIEW COMPARISON:  CT abdomen 08/29/2022 FINDINGS: The bowel gas pattern is normal. There is no evidence of free air. No radio-opaque calculi or other significant radiographic abnormality is seen. The heart is enlarged. Visualized lung fields are clear. IMPRESSION: 1. Negative abdomen. 2. Cardiomegaly without failure. Electronically Signed   By: Marin Roberts M.D.   On: 12/04/2022 10:10   DG CHEST PORT 1 VIEW  Result Date: 12/04/2022 CLINICAL DATA:  Abdominal pain after EUS. EXAM: PORTABLE CHEST 1 VIEW COMPARISON:  None Available. FINDINGS: The heart is at the upper limits of normal in size. Normal pulmonary vascularity. No focal consolidation, pleural effusion, or pneumothorax. No acute osseous abnormality. IMPRESSION: No active disease. Electronically Signed   By: Obie Dredge M.D.   On: 12/04/2022 10:08     Subjective: No acute issues or events overnight denies nausea vomiting diarrhea constipation headache fevers chills or chest pain.  Prior episode of chest pain/nausea has resolved despite advancement in diet.   Discharge Exam: Vitals:   12/05/22 0014 12/05/22 0606  BP: (!) 152/75 (!) 140/65  Pulse: 77 67  Resp: 16 16  Temp: 98.9 F (37.2 C) 98.6 F (37 C)  SpO2: 100% 93%   Vitals:   12/04/22 1609 12/04/22 2036 12/05/22 0014 12/05/22 0606  BP: 129/64 (!) 142/67 (!) 152/75 (!) 140/65  Pulse: 66 65 77 67  Resp: 16 16 16 16   Temp: 97.8 F (36.6 C) 98.1 F (36.7 C) 98.9 F (37.2 C) 98.6 F (37 C)  TempSrc: Oral     SpO2: 100% 98% 100% 93%  Weight:      Height:        General: Pt is alert, awake, not in acute distress Cardiovascular: RRR, S1/S2 +, no rubs, no gallops Respiratory: CTA bilaterally, no wheezing, no rhonchi Abdominal:  Soft, NT, ND, bowel sounds + Extremities: no edema, no cyanosis    The results of significant diagnostics from this hospitalization (including imaging, microbiology, ancillary and laboratory) are listed below for reference.     Microbiology: No results found for this or any previous visit (from the past 240 hour(s)).   Labs: BNP (last 3 results) No results for input(s): "BNP" in the last 8760 hours. Basic Metabolic Panel: Recent Labs  Lab 12/04/22 1124 12/04/22 1138 12/05/22 0527  NA 137  --  136  K 3.0*  --  4.8  CL 104  --  104  CO2 26  --  27  GLUCOSE 126*  --  109*  BUN 18  --  8  CREATININE 0.73  --  0.59  CALCIUM 8.9  --  8.9  MG  --  1.7  --   PHOS  --  3.0  --    Liver Function Tests: Recent Labs  Lab 12/04/22 1124 12/05/22 0527  AST 25 23  ALT 18 18  ALKPHOS 66 59  BILITOT 0.6 0.9  PROT 6.5 5.9*  ALBUMIN 3.7 3.2*   Recent Labs  Lab 12/04/22 1124 12/05/22 0527  LIPASE 36 45  AMYLASE 59  --    No results for input(s): "AMMONIA" in the last 168 hours. CBC: Recent Labs  Lab 12/04/22 1124 12/05/22 0527  WBC 6.4 9.8  HGB 11.5* 11.8*  HCT 35.1* 36.4  MCV 85.4 86.5  PLT 196 197   Urinalysis    Component Value Date/Time   COLORURINE YELLOW 08/29/2022 2026   APPEARANCEUR CLEAR 08/29/2022 2026   LABSPEC 1.010 08/29/2022 2026   PHURINE 7.0 08/29/2022 2026   GLUCOSEU NEGATIVE 08/29/2022 2026   HGBUR NEGATIVE 08/29/2022 2026   HGBUR negative 05/06/2010 1622   BILIRUBINUR NEGATIVE 08/29/2022 2026   BILIRUBINUR neg 12/14/2013 1549   KETONESUR NEGATIVE 08/29/2022 2026   PROTEINUR NEGATIVE 08/29/2022 2026   UROBILINOGEN 0.2 12/14/2013 1549   UROBILINOGEN 0.2 05/06/2010 1622   NITRITE NEGATIVE 08/29/2022 2026   LEUKOCYTESUR NEGATIVE 08/29/2022 2026   Sepsis Labs Recent Labs  Lab 12/04/22 1124 12/05/22 0527  WBC 6.4 9.8   Microbiology No results found for this or any previous visit (from the past 240 hour(s)).   Time coordinating  discharge: Over 30 minutes  SIGNED:   Azucena Fallen, DO [facility] 12/05/2022, 12:57 PM Pager   If 7PM-7AM, please contact night-coverage [email or web address]

## 2022-12-05 NOTE — Progress Notes (Signed)
Mobility Specialist - Progress Note   12/05/22 0903  Mobility  Activity Ambulated with assistance in hallway  Level of Assistance Modified independent, requires aide device or extra time  Assistive Device None  Distance Ambulated (ft) 1000 ft  Range of Motion/Exercises Active  Activity Response Tolerated well  Mobility Referral Yes  $Mobility charge 1 Mobility  Mobility Specialist Start Time (ACUTE ONLY) 0850  Mobility Specialist Stop Time (ACUTE ONLY) 0904  Mobility Specialist Time Calculation (min) (ACUTE ONLY) 14 min   Pt received in bed and agreed to mobility. Had no issues throughout session, MOD I for entirety. Returned to bed with all needs met.  Marilynne Halsted Mobility Specialist

## 2022-12-05 NOTE — Anesthesia Postprocedure Evaluation (Signed)
Anesthesia Post Note  Patient: Rebecca Reid  Procedure(s) Performed: UPPER ENDOSCOPIC ULTRASOUND (EUS) RADIAL ESOPHAGOGASTRODUODENOSCOPY (EGD) BIOPSY FINE NEEDLE ASPIRATION (FNA) LINEAR     Patient location during evaluation: PACU Anesthesia Type: MAC Level of consciousness: awake and alert Pain management: pain level controlled Vital Signs Assessment: post-procedure vital signs reviewed and stable Respiratory status: spontaneous breathing, nonlabored ventilation, respiratory function stable and patient connected to nasal cannula oxygen Cardiovascular status: stable and blood pressure returned to baseline Postop Assessment: no apparent nausea or vomiting Anesthetic complications: no   No notable events documented.  Last Vitals:  Vitals:   12/05/22 0014 12/05/22 0606  BP: (!) 152/75 (!) 140/65  Pulse: 77 67  Resp: 16 16  Temp: 37.2 C 37 C  SpO2: 100% 93%    Last Pain:  Vitals:   12/05/22 0141  TempSrc:   PainSc: 0-No pain                 Earl Lites P Zaiyah Sottile

## 2022-12-05 NOTE — Progress Notes (Addendum)
Progress Note  Primary GI: Dr. Myrtie Neither   Subjective  Chief Complaint: Pancreatic cyst  No family was present at the time of my evaluation. Patient states she has decreased appetite, pain is now right upper quadrant abdominal pain into her back that can radiate down to right lower quadrant especially with movement or deep breath. Had GI cocktail this morning with some help, has some reflux.  Denies nausea vomiting. No fevers or chills. Last bowel movement after procedure yesterday, large volume no melena or hematochezia.   Objective   Vital signs in last 24 hours: Temp:  [97.3 F (36.3 C)-98.9 F (37.2 C)] 98.6 F (37 C) (05/17 0606) Pulse Rate:  [39-82] 67 (05/17 0606) Resp:  [11-26] 16 (05/17 0606) BP: (95-153)/(37-94) 140/65 (05/17 0606) SpO2:  [90 %-100 %] 93 % (05/17 0606) Last BM Date : 12/04/22 Last BM recorded by nurses in past 5 days No data recorded  General:   female in no acute distress  Heart:  Regular rate and rhythm; no murmurs, no chest wall pain to palpation Pulm: Clear anteriorly; no wheezing Abdomen: Non-distended AB, Active bowel sounds.  Mild to moderate tenderness right upper quadrant, some right lower quadrant, no rebound or peritoneal signs.  No rash Extremities:  without  edema. Neurologic:  Alert and  oriented x4;  No focal deficits.  Psych:  Cooperative. Normal mood and affect.  Intake/Output from previous day: 05/16 0701 - 05/17 0700 In: 820 [P.O.:120; I.V.:600; IV Piggyback:100] Out: -  Intake/Output this shift: No intake/output data recorded.  Studies/Results: DG Abd Portable 2V  Result Date: 12/04/2022 CLINICAL DATA:  [id number] Chest pain [id number] Abdominal pain [id number] EXAM: PORTABLE ABDOMEN - 2 VIEW COMPARISON:  Same day abdominal radiograph, August 29, 2022 FINDINGS: The bowel gas pattern is normal. There is no evidence of free air. No radio-opaque calculi or other significant radiographic abnormality is seen. Opacity of the LEFT lung  base is favored to be due to breast implant shadow given lack of opacity on contemporaneous chest radiograph. IMPRESSION: No radiographic etiology for epigastric pain is identified. Electronically Signed   By: Meda Klinefelter M.D.   On: 12/04/2022 15:59   DG CHEST PORT 1 VIEW  Result Date: 12/04/2022 CLINICAL DATA:  [id number] Chest pain [id number] Abdominal pain [id number] EXAM: PORTABLE CHEST 1 VIEW COMPARISON:  Same day chest radiograph FINDINGS: The cardiomediastinal silhouette is unchanged in contour.Improved aeration of bilateral lung bases. No pleural effusion. No pneumothorax. No acute pleuroparenchymal abnormality. IMPRESSION: No acute cardiopulmonary abnormality. Electronically Signed   By: Meda Klinefelter M.D.   On: 12/04/2022 15:55   DG Abd Portable 1V  Result Date: 12/04/2022 CLINICAL DATA:  Abdominal pain following endoscopic ultrasound. EXAM: PORTABLE ABDOMEN - 1 VIEW, decubitus. COMPARISON:  Acute abdominal series 12/04/2022 FINDINGS: The bowel gas pattern is normal. No radio-opaque calculi or other significant radiographic abnormality are seen. No free air is present on the decubitus view. IMPRESSION: Negative. Electronically Signed   By: Marin Roberts M.D.   On: 12/04/2022 10:11   DG Abd Portable 2V  Result Date: 12/04/2022 CLINICAL DATA:  Abdominal pain following endoscopic ultrasound EXAM: PORTABLE ABDOMEN - 2 VIEW COMPARISON:  CT abdomen 08/29/2022 FINDINGS: The bowel gas pattern is normal. There is no evidence of free air. No radio-opaque calculi or other significant radiographic abnormality is seen. The heart is enlarged. Visualized lung fields are clear. IMPRESSION: 1. Negative abdomen. 2. Cardiomegaly without failure. Electronically Signed   By: Virl Son.D.  On: 12/04/2022 10:10   DG CHEST PORT 1 VIEW  Result Date: 12/04/2022 CLINICAL DATA:  Abdominal pain after EUS. EXAM: PORTABLE CHEST 1 VIEW COMPARISON:  None Available. FINDINGS: The heart is at the  upper limits of normal in size. Normal pulmonary vascularity. No focal consolidation, pleural effusion, or pneumothorax. No acute osseous abnormality. IMPRESSION: No active disease. Electronically Signed   By: Obie Dredge M.D.   On: 12/04/2022 10:08    Lab Results: Recent Labs    12/04/22 1124 12/05/22 0527  WBC 6.4 9.8  HGB 11.5* 11.8*  HCT 35.1* 36.4  PLT 196 197   BMET Recent Labs    12/04/22 1124 12/05/22 0527  NA 137 136  K 3.0* 4.8  CL 104 104  CO2 26 27  GLUCOSE 126* 109*  BUN 18 8  CREATININE 0.73 0.59  CALCIUM 8.9 8.9   LFT Recent Labs    12/05/22 0527  PROT 5.9*  ALBUMIN 3.2*  AST 23  ALT 18  ALKPHOS 59  BILITOT 0.9   PT/INR No results for input(s): "LABPROT", "INR" in the last 72 hours.   Scheduled Meds:  amLODipine  5 mg Oral Daily   amoxicillin-clavulanate  1 tablet Oral Q12H   metoprolol succinate  50 mg Oral Daily   pantoprazole (PROTONIX) IV  40 mg Intravenous Q24H   sertraline  50 mg Oral Daily   Continuous Infusions:  sodium chloride Stopped (12/04/22 1408)   lactated ringers Stopped (12/04/22 1425)   lactated ringers 200 mL/hr at 12/05/22 1610      Patient profile:   11 female with history of 5.7 cm cystic lesion pancreas head  09/25/2022 MR abdomen without contrast 5.7 cystic lesion posterior margin pancreatic head and CBD possible communication distal CBD, mild diffuse biliary ductal dilatation, without evidence of choledocholithiasis.   Impression/Plan:   Pancreatic head pseudocyst 5.7 cm with mild diffuse biliary ductal dilatation possible communication CBD 12/05/2022 EUS with Dr. Meridee Score showed an EGD salmon-colored mucosa suspicious for short segment Barrett's, 3 cm hiatal hernia, erythematous mucosa gastric body and antrum biopsied normal duodenum.  EUS showed cystic lesion pancreatic head endoscopic appearance suggestive of intraductal papillary mucinous neoplasm FNA performed.  Pancreatic duct dilation improved after cyst  aspiration.  Pancreatic parenchymal abnormalities consistent atrophy noted entire pancreas.  CBD dilation noted up to 12 mm without stones.  No malignant appearing lymph nodes were visualized in the celiac region peripancreatic region reported hepatitis region. Post procedurally patient began to have new onset upper abdominal/epigastric and chest discomfort. -Patient's had unremarkable chest x-ray and KUB immediately after the procedure and yesterday evening, no evidence of free air or perforation. -Lipase negative 36 to 45 -Troponin negative, CRP unremarkable  - continue supportive care with IVF, pain control per primary -Pending pathology/cytology -Pending CA 19-9 This time patient continues to have postprocedural pain, no rebound tenderness but will plan on CT abdomen pelvis with contrast to exclude complications such as perforation, ETC -If patient CT abdomen pelvis is unremarkable, can plan on discharging home on Protonix 40 mg twice daily, Carafate and close follow-up with our office.  Addendum CT shows  Nonspecific increased free fluid surrounding the pancreatic head extending into Morison's pouch and the right hemiabdomen may be reactive, secondary to acute pancreatitis/duodenitis, or perforation. No free air.. Similar size pancreatic head cyst. Mural thickening of duodenum.  Patient has been tolerating PO diet, advanced to soft diet Has not required pain medications today and has been up walking the halls Negative lipase Discussed  with Dr. Marina Goodell, likely reactive, will increase PPI BID for 4 weeks, discharge on low fat diet, ER precautions and schedule for follow up in our office.   Normocytic anemia -2 g drop of hemoglobin from 13-11.8, possibly dilutional Likely dilutional, no evidence of GI bleeding  GERD On outpatient omeprazole with some breath through Increase pantoprazole 40 mg twice daily Continue GI cocktails as needed  Principal Problem:   Chest pain Active  Problems:   Essential hypertension   GERD   Pancreatic cyst   Hypokalemia   Normocytic anemia   Depression with anxiety    LOS: 0 days   Doree Albee  12/05/2022, 8:22 AM  GI ATTENDING  Interval history data reviewed.  Agree with interval progress note as above.  CT has been completed, awaiting report.  Marchelle Folks will follow-up on this and communicate with the patient and the hospitalist today.  Hopefully she will be able to go home tonight.  Wilhemina Bonito. Eda Keys., M.D. [facility] Division of Gastroenterology

## 2022-12-06 LAB — CANCER ANTIGEN 19-9: CA 19-9: 3 U/mL (ref 0–35)

## 2022-12-08 ENCOUNTER — Encounter: Payer: Self-pay | Admitting: Gastroenterology

## 2022-12-08 ENCOUNTER — Telehealth: Payer: Self-pay

## 2022-12-08 ENCOUNTER — Encounter (HOSPITAL_COMMUNITY): Payer: Self-pay | Admitting: Gastroenterology

## 2022-12-08 NOTE — Telephone Encounter (Signed)
-----   Message from Doree Albee, [place] sent at 12/05/2022  5:19 PM EDT ----- Regarding: Please set up a hospital follow up! Thanks! Patient of Dr. Myrtie Neither, S/p pancreatic cyst aspiration with Dr. Meridee Score, post procedure pain.  Will need follow up in the office with Dr. Myrtie Neither or myself, next available.  Thanks! Marchelle Folks

## 2022-12-08 NOTE — Telephone Encounter (Signed)
Pt scheduled to see Quentin Mulling PA 02/18/23 at 9am, appt letter mailed to pt.

## 2022-12-09 ENCOUNTER — Ambulatory Visit: Payer: Medicare HMO | Admitting: Gastroenterology

## 2022-12-11 ENCOUNTER — Telehealth: Payer: Self-pay | Admitting: Gastroenterology

## 2022-12-11 DIAGNOSIS — R109 Unspecified abdominal pain: Secondary | ICD-10-CM

## 2022-12-11 DIAGNOSIS — R197 Diarrhea, unspecified: Secondary | ICD-10-CM

## 2022-12-11 DIAGNOSIS — K862 Cyst of pancreas: Secondary | ICD-10-CM

## 2022-12-11 NOTE — Telephone Encounter (Signed)
Looks like this pt has been speaking with Dr Myrtie Neither.  Dr Meridee Score only did the EUS.  I will send to [place].     Sure thing, thanks for the note.   - Dr. Myrtie Neither  Last read by Burke Keels at  1:09 PM on 12/10/2022. Dec 08, 2022       12/08/22 11:48 AM Missy Sabins, RN routed this conversation to Sherrilyn Rist, MD Burke Keels  to [facility] (supporting Sherrilyn Rist, MD)      12/08/22 11:39 AM Dr. Myrtie Neither,     Just following up with you.  My EUS was performed on May 16.  I'm sure at some point you will read those notes.  I would like to postpone my colonoscopy until I have healed from that procedure, and we have the results back.  I just received a message on MyChart that I have a follow-up appointment with Marchelle Folks on July 31st.   Thank you so much.  Have a great week     Rebecca Reid

## 2022-12-11 NOTE — Telephone Encounter (Signed)
Patient calling to follow up on MyChart message, also wanting to know if it is okay to take Imodium. Please advise

## 2022-12-16 ENCOUNTER — Other Ambulatory Visit (INDEPENDENT_AMBULATORY_CARE_PROVIDER_SITE_OTHER): Payer: Medicare HMO

## 2022-12-16 DIAGNOSIS — R109 Unspecified abdominal pain: Secondary | ICD-10-CM

## 2022-12-16 DIAGNOSIS — K862 Cyst of pancreas: Secondary | ICD-10-CM | POA: Diagnosis not present

## 2022-12-16 LAB — COMPREHENSIVE METABOLIC PANEL
ALT: 12 U/L (ref 0–35)
AST: 17 U/L (ref 0–37)
Albumin: 4 g/dL (ref 3.5–5.2)
Alkaline Phosphatase: 78 U/L (ref 39–117)
BUN: 22 mg/dL (ref 6–23)
CO2: 27 mEq/L (ref 19–32)
Calcium: 9.6 mg/dL (ref 8.4–10.5)
Chloride: 98 mEq/L (ref 96–112)
Creatinine, Ser: 0.86 mg/dL (ref 0.40–1.20)
GFR: 65.88 mL/min (ref >60.00)
Glucose, Bld: 93 mg/dL (ref 70–99)
Potassium: 3.7 mEq/L (ref 3.5–5.1)
Sodium: 135 mEq/L (ref 135–145)
Total Bilirubin: 0.5 mg/dL (ref 0.2–1.2)
Total Protein: 7 g/dL (ref 6.0–8.3)

## 2022-12-16 LAB — CBC WITH DIFFERENTIAL/PLATELET
Basophils Absolute: 0 10*3/uL (ref 0.0–0.1)
Basophils Relative: 0.5 % (ref 0.0–3.0)
Eosinophils Absolute: 0.1 10*3/uL (ref 0.0–0.7)
Eosinophils Relative: 1.4 % (ref 0.0–5.0)
HCT: 37.4 % (ref 36.0–46.0)
Hemoglobin: 12.5 g/dL (ref 12.0–15.0)
Lymphocytes Relative: 21 % (ref 12.0–46.0)
Lymphs Abs: 1.6 10*3/uL (ref 0.7–4.0)
MCHC: 33.5 g/dL (ref 30.0–36.0)
MCV: 83.4 fl (ref 78.0–100.0)
Monocytes Absolute: 0.7 10*3/uL (ref 0.1–1.0)
Monocytes Relative: 8.8 % (ref 3.0–12.0)
Neutro Abs: 5.2 10*3/uL (ref 1.4–7.7)
Neutrophils Relative %: 68.3 % (ref 43.0–77.0)
Platelets: 340 10*3/uL (ref 150.0–400.0)
RBC: 4.48 Mil/uL (ref 3.87–5.11)
RDW: 14.2 % (ref 11.5–15.5)
WBC: 7.6 10*3/uL (ref 4.0–10.5)

## 2022-12-16 LAB — AMYLASE: Amylase: 47 U/L (ref 27–131)

## 2022-12-16 LAB — LIPASE: Lipase: 42 U/L (ref 11.0–59.0)

## 2022-12-16 NOTE — Telephone Encounter (Signed)
[place],  Just seeing this after being out of the office several days.  Hope she is feeling better, and please let her know that I will pass this along to Dr. Meridee Score.  He did the EUS she had on 12/04/22 and can best address the question.  Thanks,  - HD  _______________  Liz Beach,    Please see patient message from last week with questions re: persistent symptoms after EUS 12 days ago.  Thx  - HD

## 2022-12-16 NOTE — Telephone Encounter (Signed)
[place]. Can you please let me know if there is any paperwork from [employer]?  Thanks.  GM

## 2022-12-16 NOTE — Telephone Encounter (Signed)
See patient message for details

## 2022-12-17 NOTE — Addendum Note (Signed)
Addended by: Loretha Stapler on: 12/17/2022 08:26 AM   Modules accepted: Orders

## 2022-12-17 NOTE — Telephone Encounter (Signed)
Patty, Please let patient know that I'm not sure what to make of the changes in bowel habits. With her having received antibiotics as well, she would be at risk for antibiotic associated diarrhea. I would recommend we do a set of stool studies (stool culture and C. difficile PCR and fecal elastase). I would recommend she initiate a probiotic such as Florastor once daily for the next 2 to 3 weeks. We are glad to hear that the pain is gone, the discomfort post EUS which was felt to clinically be pancreatitis though imaging did not suggest that, should be a single event just around the time of the procedure and hopefully not a recurring problem. I still recommend the pancreas protocol CT abdomen to be repeated, but if her pain is better, then I would try to have that completed within the next 2 to 3 weeks. Thanks. GM

## 2022-12-18 ENCOUNTER — Encounter: Payer: Medicare HMO | Admitting: Gastroenterology

## 2022-12-18 ENCOUNTER — Other Ambulatory Visit (HOSPITAL_BASED_OUTPATIENT_CLINIC_OR_DEPARTMENT_OTHER): Payer: Self-pay

## 2022-12-18 ENCOUNTER — Other Ambulatory Visit: Payer: Self-pay | Admitting: Family Medicine

## 2022-12-18 ENCOUNTER — Other Ambulatory Visit: Payer: Self-pay

## 2022-12-18 DIAGNOSIS — I10 Essential (primary) hypertension: Secondary | ICD-10-CM

## 2022-12-18 MED ORDER — AMLODIPINE BESYLATE 5 MG PO TABS
5.0000 mg | ORAL_TABLET | Freq: Every day | ORAL | 1 refills | Status: DC
Start: 2022-12-18 — End: 2023-05-04
  Filled 2022-12-18: qty 90, 90d supply, fill #0

## 2022-12-18 MED ORDER — AZELASTINE HCL 137 MCG/SPRAY NA SOLN
NASAL | 1 refills | Status: DC
  Filled 2022-12-18: qty 30, 30d supply, fill #0

## 2022-12-18 MED ORDER — LOSARTAN POTASSIUM-HCTZ 100-25 MG PO TABS
1.0000 | ORAL_TABLET | Freq: Every day | ORAL | 0 refills | Status: DC
  Filled 2022-12-18: qty 90, 90d supply, fill #0

## 2022-12-18 MED ORDER — SERTRALINE HCL 50 MG PO TABS
50.0000 mg | ORAL_TABLET | Freq: Every day | ORAL | 1 refills | Status: DC
  Filled 2022-12-18: qty 90, 90d supply, fill #0

## 2022-12-18 MED ORDER — METOPROLOL SUCCINATE ER 50 MG PO TB24
50.0000 mg | ORAL_TABLET | Freq: Every day | ORAL | 1 refills | Status: DC
  Filled 2022-12-18: qty 90, 90d supply, fill #0

## 2022-12-19 ENCOUNTER — Ambulatory Visit (HOSPITAL_COMMUNITY)
Admission: RE | Admit: 2022-12-19 | Discharge: 2022-12-19 | Disposition: A | Discharge status: Home / Self Care | Payer: Medicare HMO | Source: Ambulatory Visit | Attending: Gastroenterology | Admitting: Gastroenterology

## 2022-12-19 DIAGNOSIS — R109 Unspecified abdominal pain: Secondary | ICD-10-CM | POA: Diagnosis not present

## 2022-12-19 DIAGNOSIS — K8689 Other specified diseases of pancreas: Secondary | ICD-10-CM | POA: Diagnosis not present

## 2022-12-19 DIAGNOSIS — K862 Cyst of pancreas: Secondary | ICD-10-CM | POA: Insufficient documentation

## 2022-12-19 MED ORDER — IOHEXOL 300 MG/ML  SOLN
100.0000 mL | Freq: Once | INTRAMUSCULAR | Status: AC | PRN
  Administered 2022-12-19: 100 mL via INTRAVENOUS

## 2022-12-22 ENCOUNTER — Encounter: Payer: Self-pay | Admitting: Gastroenterology

## 2022-12-22 ENCOUNTER — Other Ambulatory Visit (HOSPITAL_BASED_OUTPATIENT_CLINIC_OR_DEPARTMENT_OTHER): Payer: Self-pay

## 2022-12-22 ENCOUNTER — Other Ambulatory Visit (HOSPITAL_COMMUNITY): Payer: Self-pay

## 2022-12-22 NOTE — Progress Notes (Signed)
[facility] pancreatic fluid analysis  Cytology Nondiagnostic for malignancy Acellular  CEA 20 ng/mL Amylase 6548 units/L Glucose<4.3 mg/dL  Based on this fluid analysis, 1 would likely consider this to be a mucinous cystic neoplasm or IPMN.  This is more likely the etiology of the patient's cyst.  KRAS/GNAS testing was not performed but can certainly be considered.  Analysis may be possible with fluid that was sent to the diagnostics facility.  We are awaiting the CT pancreas protocol abdomen read, but my quick evaluation shows that the cyst has quickly reaccumulated.  I would like to present this patient's case at multidisciplinary conference in the next few weeks.  I think the next steps for this patient likely should be at least consideration of surgical evaluation but I would like to discuss this further at [facility] and also have the results of the CT pancreas abdomen back.  We will forward this to the patient's primary gastroenterologist as well and I have my team reach out to the patient.  A scanned set of these patient's labs will be sent so that the patient can also have them for documentation purposes.

## 2022-12-24 ENCOUNTER — Encounter: Payer: Self-pay | Admitting: Family Medicine

## 2022-12-24 NOTE — Progress Notes (Signed)
The pt has been advised of the plan thus far and will await further recommendations after conference and CT results are reviewed.

## 2022-12-26 ENCOUNTER — Ambulatory Visit: Payer: Medicare HMO

## 2022-12-26 DIAGNOSIS — R109 Unspecified abdominal pain: Secondary | ICD-10-CM | POA: Diagnosis not present

## 2022-12-26 DIAGNOSIS — R197 Diarrhea, unspecified: Secondary | ICD-10-CM | POA: Diagnosis not present

## 2022-12-26 DIAGNOSIS — K862 Cyst of pancreas: Secondary | ICD-10-CM | POA: Diagnosis not present

## 2022-12-28 LAB — STOOL CULTURE: E coli, Shiga toxin Assay: NEGATIVE

## 2022-12-29 ENCOUNTER — Ambulatory Visit: Payer: Medicare HMO | Admitting: Podiatry

## 2022-12-29 DIAGNOSIS — M722 Plantar fascial fibromatosis: Secondary | ICD-10-CM

## 2022-12-29 LAB — STOOL CULTURE

## 2022-12-29 MED ORDER — BETAMETHASONE SOD PHOS & ACET 6 (3-3) MG/ML IJ SUSP
3.0000 mg | Freq: Once | INTRAMUSCULAR | Status: AC
Start: 2022-12-29 — End: 2022-12-29
  Administered 2022-12-29: 3 mg via INTRA_ARTICULAR

## 2022-12-29 NOTE — Progress Notes (Signed)
Chief Complaint  Patient presents with   Foot Pain    Right foot pain near the heel area. Patient states that its been a while since she's had an injection. She has been walking barefooted lately and would like to received an injection today.     Subjective: 77 y.o. female presenting today for follow-up evaluation of plantar fasciitis to the right foot.  Last seen in the office over 1 year ago.  She says that she was doing well up until few months ago when she began to have increased pain and tenderness to the right heel.  Presenting for further treatment evaluation  Past Medical History:  Diagnosis Date   ALLERGIC RHINITIS CAUSE UNSPECIFIED 04/30/2009   Anxiety    Arthritis    DEPRESSION, HX OF 04/11/2010   Dysuria 05/06/2010   Gallstones    GERD 04/30/2009   HYPERLIPIDEMIA 04/30/2009   HYPERTENSION 04/30/2009   Lactose intolerance    LEG EDEMA 12/12/2009   OSTEOPENIA 04/30/2009   PVC (premature ventricular contraction)    Status post dilation of esophageal narrowing    Past Surgical History:  Procedure Laterality Date   ABDOMINAL HYSTERECTOMY  1986   AUGMENTATION MAMMAPLASTY Bilateral    BIOPSY  12/04/2022   Procedure: BIOPSY;  Surgeon: Lemar Lofty., MD;  Location: [facility];  Service: Gastroenterology;;   BREAST ENHANCEMENT SURGERY  1987   BREAST SURGERY  1974   biopsy   CARDIAC CATHETERIZATION N/A 11/02/2015   Procedure: Left Heart Cath and Coronary Angiography;  Surgeon: Lyn Records, MD;  Location: [facility] INVASIVE CV LAB;  Service: Cardiovascular;  Laterality: N/A;   Carpel tunel Right    CATARACT EXTRACTION, BILATERAL Bilateral    one in feb and one in March Dr. Darel Hong   CHOLECYSTECTOMY  1974   ectopic pregnancy     ESOPHAGOGASTRODUODENOSCOPY N/A 12/04/2022   Procedure: ESOPHAGOGASTRODUODENOSCOPY (EGD);  Surgeon: Lemar Lofty., MD;  Location: [facility];  Service: Gastroenterology;  Laterality: N/A;   EUS N/A 12/04/2022   Procedure: UPPER  ENDOSCOPIC ULTRASOUND (EUS) RADIAL;  Surgeon: Lemar Lofty., MD;  Location: [facility];  Service: Gastroenterology;  Laterality: N/A;   FINE NEEDLE ASPIRATION N/A 12/04/2022   Procedure: FINE NEEDLE ASPIRATION (FNA) LINEAR;  Surgeon: Lemar Lofty., MD;  Location: [facility];  Service: Gastroenterology;  Laterality: N/A;   finger surgery  2019   Dupuytren's contracture surgery   OTHER SURGICAL HISTORY     Stomach was not developed at birth   Allergies  Allergen Reactions   Doxycycline Nausea And Vomiting   Erythromycin Base Other (See Comments)    yeast infection, GI upset   Lactose Intolerance (Gi) Diarrhea    Upset stomach    Metronidazole Diarrhea, Itching and Nausea Only   Naproxen Other (See Comments)    GI upset   Prednisone Hives    anxiety, insomnia   Scallops [Shellfish Allergy] Nausea And Vomiting     Objective: Physical Exam General: The patient is alert and oriented x3 in no acute distress.  Dermatology: Skin is warm, dry and supple bilateral lower extremities. Negative for open lesions or macerations bilateral.  Hyperkeratotic symptomatic corn noted to the dorsal aspect of the left fifth toe overlying the area of the PIPJ  Vascular: Dorsalis Pedis and Posterior Tibial pulses palpable bilateral.  Capillary fill time is immediate to all digits.  Neurological: Epicritic and protective threshold intact bilateral.   Musculoskeletal: Tenderness with palpation along the plantar fascia right consistent with plantar fasciitis  Assessment: 1. Plantar fasciitis right 2.  Symptomatic hammertoe with overlying callus/corn fifth digit left 3. H/o arthroplasty fifth digit left 20+ years ago  Plan of Care:  -Patient evaluated.  -Injection of 0.5 cc Celestone Soluspan injected into the right plantar fascia -No NSAIDs prescribed.  Patient has experienced GI sensitivity with diclofenac and meloxicam  -No steroid prescribed.  Patient states she breaks out in  hives and it makes her "crazy" -Continue wearing OTC power steps and good supportive shoes.  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 Felecia Shelling, DPM    [address] [place], [place] [address]                Office [phone]  Fax [phone]

## 2022-12-30 LAB — STOOL CULTURE

## 2022-12-30 LAB — CLOSTRIDIUM DIFFICILE BY PCR: Toxigenic C. Difficile by PCR: NEGATIVE

## 2023-01-01 LAB — PANCREATIC ELASTASE, FECAL: Pancreatic Elastase-1, Stool: 451 mcg/g

## 2023-01-07 ENCOUNTER — Other Ambulatory Visit: Payer: Self-pay

## 2023-01-07 ENCOUNTER — Encounter: Payer: Self-pay | Admitting: Gastroenterology

## 2023-01-07 NOTE — Progress Notes (Signed)
The proposed treatment discussed in conference is for discussion purpose only and is not a binding recommendation.  The patients have not been physically examined, or presented with their treatment options.  Therefore, final treatment plans cannot be decided.  

## 2023-01-07 NOTE — Progress Notes (Signed)
The pt is ok with [facility] referral to discuss- referral made and records faxed

## 2023-01-07 NOTE — Progress Notes (Signed)
Patient's case was discussed at multidisciplinary conference this morning. Imaging was reviewed. EUS was reviewed. Pathology was reviewed. Pancreatic fluid analysis was reviewed.  Consensus was that patient should be evaluated by pancreaticobiliary surgery for consideration of surgical intervention versus high risk surveillance with imaging/EUS.  Thankfully no evidence of overt malignancy has been found nor on imaging concerning findings.  My team will discuss this with the patient.  We will move forward with referral to Dr. Freida Busman or Dr. Donell Beers for further discussions.  Final decision to be made by patient.  I will forward this to the patient's primary gastroenterologist, Dr. Myrtie Neither as well as primary care provider Dr. Caryl Never.   Corliss Parish, MD [facility] Office # [phone]

## 2023-01-08 ENCOUNTER — Encounter: Payer: Self-pay | Admitting: Gastroenterology

## 2023-01-28 ENCOUNTER — Other Ambulatory Visit: Payer: Self-pay | Admitting: Family Medicine

## 2023-01-30 DIAGNOSIS — D490 Neoplasm of unspecified behavior of digestive system: Secondary | ICD-10-CM | POA: Diagnosis not present

## 2023-02-18 ENCOUNTER — Ambulatory Visit: Payer: Medicare HMO | Admitting: Physician Assistant

## 2023-02-18 ENCOUNTER — Encounter (INDEPENDENT_AMBULATORY_CARE_PROVIDER_SITE_OTHER): Payer: Self-pay

## 2023-03-18 ENCOUNTER — Encounter: Payer: Self-pay | Admitting: Family Medicine

## 2023-03-18 ENCOUNTER — Ambulatory Visit (INDEPENDENT_AMBULATORY_CARE_PROVIDER_SITE_OTHER): Payer: Medicare HMO | Admitting: Family Medicine

## 2023-03-18 VITALS — BP 138/68 | HR 65 | Temp 97.5°F | Ht 65.75 in | Wt 161.9 lb

## 2023-03-18 DIAGNOSIS — Z23 Encounter for immunization: Secondary | ICD-10-CM | POA: Diagnosis not present

## 2023-03-18 DIAGNOSIS — Z0000 Encounter for general adult medical examination without abnormal findings: Secondary | ICD-10-CM

## 2023-03-18 LAB — CBC WITH DIFFERENTIAL/PLATELET
Basophils Absolute: 0 10*3/uL (ref 0.0–0.1)
Basophils Relative: 0.6 % (ref 0.0–3.0)
Eosinophils Absolute: 0.1 10*3/uL (ref 0.0–0.7)
Eosinophils Relative: 1.5 % (ref 0.0–5.0)
HCT: 39.2 % (ref 36.0–46.0)
Hemoglobin: 12.8 g/dL (ref 12.0–15.0)
Lymphocytes Relative: 22.7 % (ref 12.0–46.0)
Lymphs Abs: 1.5 10*3/uL (ref 0.7–4.0)
MCHC: 32.6 g/dL (ref 30.0–36.0)
MCV: 84.9 fl (ref 78.0–100.0)
Monocytes Absolute: 0.6 10*3/uL (ref 0.1–1.0)
Monocytes Relative: 8.5 % (ref 3.0–12.0)
Neutro Abs: 4.5 10*3/uL (ref 1.4–7.7)
Neutrophils Relative %: 66.7 % (ref 43.0–77.0)
Platelets: 241 10*3/uL (ref 150.0–400.0)
RBC: 4.61 Mil/uL (ref 3.87–5.11)
RDW: 15 % (ref 11.5–15.5)
WBC: 6.7 10*3/uL (ref 4.0–10.5)

## 2023-03-18 LAB — BASIC METABOLIC PANEL
BUN: 21 mg/dL (ref 6–23)
CO2: 31 mEq/L (ref 19–32)
Calcium: 9.7 mg/dL (ref 8.4–10.5)
Chloride: 102 mEq/L (ref 96–112)
Creatinine, Ser: 0.72 mg/dL (ref 0.40–1.20)
GFR: 81.4 mL/min (ref >60.00)
Glucose, Bld: 96 mg/dL (ref 70–99)
Potassium: 3.8 mEq/L (ref 3.5–5.1)
Sodium: 139 mEq/L (ref 135–145)

## 2023-03-18 LAB — HEPATIC FUNCTION PANEL
ALT: 15 U/L (ref 0–35)
AST: 20 U/L (ref 0–37)
Albumin: 4.1 g/dL (ref 3.5–5.2)
Alkaline Phosphatase: 71 U/L (ref 39–117)
Bilirubin, Direct: 0.1 mg/dL (ref 0.0–0.3)
Total Bilirubin: 0.8 mg/dL (ref 0.2–1.2)
Total Protein: 6.7 g/dL (ref 6.0–8.3)

## 2023-03-18 LAB — LIPID PANEL
Cholesterol: 214 mg/dL — ABNORMAL HIGH (ref 0–200)
HDL: 59.9 mg/dL (ref >39.00)
LDL Cholesterol: 134 mg/dL — ABNORMAL HIGH (ref 0–99)
NonHDL: 153.7
Total CHOL/HDL Ratio: 4
Triglycerides: 101 mg/dL (ref 0.0–149.0)
VLDL: 20.2 mg/dL (ref 0.0–40.0)

## 2023-03-18 MED ORDER — TRAMADOL HCL 50 MG PO TABS: ORAL_TABLET | ORAL | 1 refills | Status: DC

## 2023-03-18 MED ORDER — LOSARTAN POTASSIUM-HCTZ 50-12.5 MG PO TABS
1.0000 | ORAL_TABLET | Freq: Every day | ORAL | 3 refills | Status: DC
Start: 2023-03-18 — End: 2023-12-01

## 2023-03-18 NOTE — Progress Notes (Signed)
Established Patient Office Visit  Subjective   Patient ID: CHAUNCY DONDLINGER, female    DOB: 06-08-1946  Age: 77 y.o. MRN: [id number]  Chief Complaint  Patient presents with   Annual Exam    HPI   Kelie is seen for physical exam.  She has history of hypertension, dyslipidemia, pancreatic cyst, GERD.  Back in the spring she underwent upper EUS for evaluation of pancreatic cyst.  Ended up developing some epigastric pain following that.  Initial concern for provoked pancreatitis.  Her lipase and amylase remain normal.  CT scan showed some peripancreatic fluid but no signs of perforation.  Patient eventually improved and went home.  She has made some serious lifestyle changes with dietary changes and lost about 20 pounds.  Feels much better overall.  Increased energy.  She has follow-up CT scan several months from now  Does have frequent insomnia.  She has noticed that combination of taking 1 extra strength Tylenol with 1 tramadol seems to help.  She does not do this nightly.  Requesting refill of tramadol.  Health maintenance reviewed:  Health Maintenance  Topic Date Due   COVID-19 Vaccine (6 - 2023-24 season) 09/21/2022   Medicare Annual Wellness (AWV)  09/25/2023   DTaP/Tdap/Td (4 - Td or Tdap) 03/30/2029   Pneumonia Vaccine 64+ Years old  Completed   INFLUENZA VACCINE  Completed   DEXA SCAN  Completed   Hepatitis C Screening  Completed   Zoster Vaccines- Shingrix  Completed   HPV VACCINES  Aged Out   Fecal DNA (Cologuard)  Discontinued   -Questing flu vaccine today Review of Systems  Constitutional:  Negative for chills, fever, malaise/fatigue and weight loss.  HENT:  Negative for hearing loss.   Eyes:  Negative for blurred vision and double vision.  Respiratory:  Negative for cough and shortness of breath.   Cardiovascular:  Negative for chest pain, palpitations and leg swelling.  Gastrointestinal:  Negative for abdominal pain, blood in stool, constipation and diarrhea.   Genitourinary:  Negative for dysuria.  Skin:  Negative for rash.  Neurological:  Negative for dizziness, speech change, seizures, loss of consciousness and headaches.  Psychiatric/Behavioral:  Negative for depression. The patient has insomnia.       Objective:     BP 138/68 (Cuff Size: Normal)   Pulse 65   Temp (!) 97.5 F (36.4 C) (Oral)   Ht 5' 5.75" (1.67 m)   Wt 161 lb 14.4 oz (73.4 kg)   SpO2 97%   BMI 26.33 kg/m  BP Readings from Last 3 Encounters:  03/18/23 138/68  12/05/22 (!) 147/87  10/24/22 121/78   Wt Readings from Last 3 Encounters:  03/18/23 161 lb 14.4 oz (73.4 kg)  12/04/22 177 lb 0.5 oz (80.3 kg)  11/19/22 177 lb (80.3 kg)      Physical Exam Vitals reviewed.  Constitutional:      Appearance: She is well-developed.  HENT:     Head: Normocephalic and atraumatic.  Eyes:     Pupils: Pupils are equal, round, and reactive to light.  Neck:     Thyroid: No thyromegaly.  Cardiovascular:     Rate and Rhythm: Normal rate and regular rhythm.     Heart sounds: Normal heart sounds. No murmur heard. Pulmonary:     Effort: No respiratory distress.     Breath sounds: Normal breath sounds. No wheezing or rales.  Abdominal:     General: Bowel sounds are normal. There is no distension.  Palpations: Abdomen is soft. There is no mass.     Tenderness: There is no abdominal tenderness. There is no guarding or rebound.  Musculoskeletal:        General: Normal range of motion.     Cervical back: Normal range of motion and neck supple.  Lymphadenopathy:     Cervical: No cervical adenopathy.  Skin:    Findings: No rash.  Neurological:     Mental Status: She is alert and oriented to person, place, and time.     Cranial Nerves: No cranial nerve deficit.     Deep Tendon Reflexes: Reflexes normal.  Psychiatric:        Behavior: Behavior normal.        Thought Content: Thought content normal.        Judgment: Judgment normal.      No results found for any  visits on 03/18/23.    The 10-year ASCVD risk score (Arnett DK, et al., 2019) is: 26.4%    Assessment & Plan:   Problem List Items Addressed This Visit   None Visit Diagnoses     Need for influenza vaccination    -  Primary   Relevant Orders   Flu Vaccine QUAD High Dose(Fluad) (Completed)   Physical exam       Relevant Orders   Basic metabolic panel   Lipid panel   CBC with Differential/Platelet   Hepatic function panel     77 year old female with chronic problems as above.  She has made some very positive lifestyle changes and is doing pool exercises regularly and has lost about 20 pounds due to dietary efforts.  Blood pressures have remained stable.  She did reduce her losartan HCT dosage 100/25 mg to 1/2 tablet daily.  -Refill new prescription for losartan HCTZ 50/12.5 mg 1 daily -1 refill provided for tramadol 50 mg to take 1 nightly as needed for arthralgias -Obtain labs as above -Flu vaccine given -Discussed mammography and she declines further mammograms -Other vaccines including shingles and pneumonia up-to-date  No follow-ups on file.    Evelena Peat, MD

## 2023-03-20 NOTE — Telephone Encounter (Signed)
Noted  

## 2023-03-28 ENCOUNTER — Encounter: Payer: Self-pay | Admitting: Family Medicine

## 2023-03-30 NOTE — Telephone Encounter (Signed)
Chart has been updated.

## 2023-04-02 NOTE — Addendum Note (Signed)
Addended by: Nolon Lennert on: 04/02/2023 01:56 PM   Modules accepted: Orders

## 2023-04-03 NOTE — Addendum Note (Signed)
Addended by: Christy Sartorius on: 04/03/2023 08:37 AM   Modules accepted: Orders

## 2023-04-08 ENCOUNTER — Other Ambulatory Visit: Payer: Self-pay | Admitting: Family Medicine

## 2023-05-01 ENCOUNTER — Other Ambulatory Visit: Payer: Self-pay | Admitting: Family Medicine

## 2023-05-01 DIAGNOSIS — I10 Essential (primary) hypertension: Secondary | ICD-10-CM

## 2023-05-29 ENCOUNTER — Ambulatory Visit: Payer: Medicare HMO

## 2023-05-29 ENCOUNTER — Other Ambulatory Visit: Payer: Self-pay | Admitting: Surgery

## 2023-05-29 DIAGNOSIS — J019 Acute sinusitis, unspecified: Secondary | ICD-10-CM | POA: Diagnosis not present

## 2023-05-29 DIAGNOSIS — D490 Neoplasm of unspecified behavior of digestive system: Secondary | ICD-10-CM

## 2023-05-29 DIAGNOSIS — R0981 Nasal congestion: Secondary | ICD-10-CM | POA: Diagnosis not present

## 2023-05-31 ENCOUNTER — Encounter: Payer: Self-pay | Admitting: Family Medicine

## 2023-06-01 ENCOUNTER — Ambulatory Visit (INDEPENDENT_AMBULATORY_CARE_PROVIDER_SITE_OTHER): Payer: Medicare HMO | Admitting: Family Medicine

## 2023-06-01 ENCOUNTER — Ambulatory Visit: Payer: Medicare HMO | Admitting: Family Medicine

## 2023-06-01 ENCOUNTER — Encounter: Payer: Self-pay | Admitting: Family Medicine

## 2023-06-01 VITALS — BP 130/70 | HR 70 | Temp 97.9°F | Ht 65.75 in | Wt 158.1 lb

## 2023-06-01 DIAGNOSIS — B029 Zoster without complications: Secondary | ICD-10-CM | POA: Diagnosis not present

## 2023-06-01 MED ORDER — GABAPENTIN 100 MG PO CAPS: 100.0000 mg | ORAL_CAPSULE | Freq: Three times a day (TID) | ORAL | 1 refills | Status: DC

## 2023-06-01 MED ORDER — VALACYCLOVIR HCL 1 G PO TABS: 1000.0000 mg | ORAL_TABLET | Freq: Three times a day (TID) | ORAL | 0 refills | Status: AC

## 2023-06-01 NOTE — Progress Notes (Signed)
Established Patient Office Visit  Subjective   Patient ID: Rebecca Reid, female    DOB: Feb 21, 1946  Age: 77 y.o. MRN: [id number]  Chief Complaint  Patient presents with   Facial Pain    Patient complains of facial pain, x3 days, Tried Augmentin, Tyenol and  Depomedrol    Ear Pain    Patient complains of left ear pain, x3 days    Sore Throat    Patient complains of sore throat, x3 days   Rash    Patient complains of rash, x2 days     HPI   Rebecca Reid is seen with concern for possible shingles involving left side of face.  She states she has had shingles 3 times previously in her life.  She also has had Zostavax and Shingrix vaccines.  Onset a few days ago some left facial pain and left ear pain.  She noticed a bit of sore throat as well.  She went to a walk-in clinic in [place] on Friday and was given Depo-Medrol and started on Augmentin.  She then couple days ago developed over the weekend some rash left face in the cheek region which is blistery and concerning for shingles.  She has intermittent sharp pain with somewhat of a burning quality and 8 out of 10 in severity at its worst.  She been using Tylenol with minimal relief.  No eye involvement.  Past Medical History:  Diagnosis Date   ALLERGIC RHINITIS CAUSE UNSPECIFIED 04/30/2009   Anxiety    Arthritis    DEPRESSION, HX OF 04/11/2010   Dysuria 05/06/2010   Gallstones    GERD 04/30/2009   HYPERLIPIDEMIA 04/30/2009   HYPERTENSION 04/30/2009   Lactose intolerance    LEG EDEMA 12/12/2009   OSTEOPENIA 04/30/2009   PVC (premature ventricular contraction)    Status post dilation of esophageal narrowing    Past Surgical History:  Procedure Laterality Date   ABDOMINAL HYSTERECTOMY  1986   AUGMENTATION MAMMAPLASTY Bilateral    BIOPSY  12/04/2022   Procedure: BIOPSY;  Surgeon: Lemar Lofty., MD;  Location: [facility];  Service: Gastroenterology;;   BREAST ENHANCEMENT SURGERY  1987   BREAST SURGERY  1974   biopsy    CARDIAC CATHETERIZATION N/A 11/02/2015   Procedure: Left Heart Cath and Coronary Angiography;  Surgeon: Lyn Records, MD;  Location: [facility] INVASIVE CV LAB;  Service: Cardiovascular;  Laterality: N/A;   Carpel tunel Right    CATARACT EXTRACTION, BILATERAL Bilateral    one in feb and one in March Dr. Darel Hong   CHOLECYSTECTOMY  1974   ectopic pregnancy     ESOPHAGOGASTRODUODENOSCOPY N/A 12/04/2022   Procedure: ESOPHAGOGASTRODUODENOSCOPY (EGD);  Surgeon: Lemar Lofty., MD;  Location: [facility];  Service: Gastroenterology;  Laterality: N/A;   EUS N/A 12/04/2022   Procedure: UPPER ENDOSCOPIC ULTRASOUND (EUS) RADIAL;  Surgeon: Lemar Lofty., MD;  Location: [facility];  Service: Gastroenterology;  Laterality: N/A;   FINE NEEDLE ASPIRATION N/A 12/04/2022   Procedure: FINE NEEDLE ASPIRATION (FNA) LINEAR;  Surgeon: Lemar Lofty., MD;  Location: [facility];  Service: Gastroenterology;  Laterality: N/A;   finger surgery  2019   Dupuytren's contracture surgery   OTHER SURGICAL HISTORY     Stomach was not developed at birth    reports that she has never smoked. She has never used smokeless tobacco. She reports that she does not currently use alcohol. She reports that she does not use drugs. family history includes Arthritis in her mother; Emphysema in  her mother; Heart disease (age of onset: 27) in her father; Heart disease (age of onset: 36) in her mother; Hyperlipidemia in her father and mother; Hypertension in her father and mother; Kidney cancer in her mother; Prostate cancer in her father; Skin cancer in her mother. Allergies  Allergen Reactions   Doxycycline Nausea And Vomiting   Erythromycin Base Other (See Comments)    yeast infection, GI upset   Lactose Intolerance (Gi) Diarrhea    Upset stomach    Metronidazole Diarrhea, Itching and Nausea Only   Naproxen Other (See Comments)    GI upset   Prednisone Hives    anxiety, insomnia   Scallops [Shellfish  Allergy] Nausea And Vomiting    Review of Systems  Constitutional:  Negative for chills and fever.  Eyes:  Negative for blurred vision.  Cardiovascular:  Negative for chest pain.  Neurological:  Negative for headaches.      Objective:     BP 130/70 (BP Location: Left Arm, Patient Position: Sitting, Cuff Size: Normal)   Pulse 70   Temp 97.9 F (36.6 C) (Oral)   Ht 5' 5.75" (1.67 m)   Wt 158 lb 1.6 oz (71.7 kg)   SpO2 98%   BMI 25.71 kg/m  BP Readings from Last 3 Encounters:  06/01/23 130/70  03/18/23 138/68  12/05/22 (!) 147/87   Wt Readings from Last 3 Encounters:  06/01/23 158 lb 1.6 oz (71.7 kg)  03/18/23 161 lb 14.4 oz (73.4 kg)  12/04/22 177 lb 0.5 oz (80.3 kg)      Physical Exam Vitals reviewed.  Constitutional:      General: She is not in acute distress.    Appearance: She is not ill-appearing.  HENT:     Right Ear: Tympanic membrane normal.     Left Ear: Tympanic membrane normal.     Ears:     Comments: Left ear canal is clear    Mouth/Throat:     Mouth: Mucous membranes are moist. No oral lesions.     Pharynx: Oropharynx is clear.  Eyes:     Extraocular Movements:     Right eye: Normal extraocular motion.     Pupils: Pupils are equal, round, and reactive to light.  Cardiovascular:     Rate and Rhythm: Normal rate and regular rhythm.  Skin:    Comments: She has a couple small clusters of slightly raised vesicles left side of face in dermatome distribution  Neurological:     Mental Status: She is alert.      No results found for any visits on 06/01/23.    The 10-year ASCVD risk score (Arnett DK, et al., 2019) is: 24.1%    Assessment & Plan:   Shingles involving left side of face.  No eye involvement.  She has had both Zostavax and Shingrix vaccines previously and she states this is her fourth episode of clinical shingles in her lifetime.  Moderate to severe pain as above  -Continue Tylenol every 8 hours as needed -Start Valtrex 1 g 3  times daily for 7 days -Start gabapentin 100 mg every 8 hours.  If tolerating well may increase slowly up to 300 mg every 8 hours as needed.  We did discuss potential side effects of gabapentin -Follow-up for any persistent or worsening pain or other concerns   Rebecca Peat, MD

## 2023-06-01 NOTE — Patient Instructions (Signed)
Start the Valtrex one three times daily  Start the Gabapentin 100 mg three times daily (every 8 hours).  If not getting enough relief with that, increase to 2-3 every 8 hours as needed.

## 2023-06-01 NOTE — Telephone Encounter (Signed)
Noted.  Rebecca Reid W Andros Channing MD [facility]  

## 2023-06-12 ENCOUNTER — Ambulatory Visit (INDEPENDENT_AMBULATORY_CARE_PROVIDER_SITE_OTHER): Payer: Medicare HMO | Admitting: Family Medicine

## 2023-06-12 ENCOUNTER — Encounter: Payer: Self-pay | Admitting: Family Medicine

## 2023-06-12 VITALS — BP 150/80 | HR 64 | Temp 97.7°F | Ht 65.75 in | Wt 162.3 lb

## 2023-06-12 DIAGNOSIS — B029 Zoster without complications: Secondary | ICD-10-CM

## 2023-06-12 DIAGNOSIS — H9312 Tinnitus, left ear: Secondary | ICD-10-CM | POA: Diagnosis not present

## 2023-06-12 DIAGNOSIS — I10 Essential (primary) hypertension: Secondary | ICD-10-CM

## 2023-06-12 NOTE — Progress Notes (Signed)
Established Patient Office Visit  Subjective   Patient ID: Rebecca Reid, female    DOB: 12-01-1945  Age: 77 y.o. MRN: [id number]  Chief Complaint  Patient presents with   Medical Management of Chronic Issues    HPI   Macil is seen today for follow-up regarding recent shingles involving left side of face.  She had had both Zostavax and Shingrix vaccines.  Her pain was 8 out of 10 at its worst severity.  We placed her on Valtrex for 1 week and also gabapentin currently 200 mg nightly.  Pain is much improved.  Only minimal pain at this time but mostly at night.  Still taking gabapentin before bedtime.  She has had a little bit of tinnitus left ear but no acute hearing changes.  We does not see any involvement of the outer canal.  No eye involvement.  Kiyara has history of hypertension.  She is currently on amlodipine 5 mg daily losartan/HCTZ 1/2 tablet daily.  She had previous issues with orthostasis but none recently.  Has not been monitoring blood pressure much recently.  Compliant with medications as above.  She has history of pancreatic cyst and has upcoming MRI to reassess.  Past Medical History:  Diagnosis Date   ALLERGIC RHINITIS CAUSE UNSPECIFIED 04/30/2009   Anxiety    Arthritis    DEPRESSION, HX OF 04/11/2010   Dysuria 05/06/2010   Gallstones    GERD 04/30/2009   HYPERLIPIDEMIA 04/30/2009   HYPERTENSION 04/30/2009   Lactose intolerance    LEG EDEMA 12/12/2009   OSTEOPENIA 04/30/2009   PVC (premature ventricular contraction)    Status post dilation of esophageal narrowing    Past Surgical History:  Procedure Laterality Date   ABDOMINAL HYSTERECTOMY  1986   AUGMENTATION MAMMAPLASTY Bilateral    BIOPSY  12/04/2022   Procedure: BIOPSY;  Surgeon: Lemar Lofty., MD;  Location: [facility];  Service: Gastroenterology;;   BREAST ENHANCEMENT SURGERY  1987   BREAST SURGERY  1974   biopsy   CARDIAC CATHETERIZATION N/A 11/02/2015   Procedure: Left Heart Cath and  Coronary Angiography;  Surgeon: Lyn Records, MD;  Location: [facility] INVASIVE CV LAB;  Service: Cardiovascular;  Laterality: N/A;   Carpel tunel Right    CATARACT EXTRACTION, BILATERAL Bilateral    one in feb and one in March Dr. Darel Hong   CHOLECYSTECTOMY  1974   ectopic pregnancy     ESOPHAGOGASTRODUODENOSCOPY N/A 12/04/2022   Procedure: ESOPHAGOGASTRODUODENOSCOPY (EGD);  Surgeon: Lemar Lofty., MD;  Location: [facility];  Service: Gastroenterology;  Laterality: N/A;   EUS N/A 12/04/2022   Procedure: UPPER ENDOSCOPIC ULTRASOUND (EUS) RADIAL;  Surgeon: Lemar Lofty., MD;  Location: [facility];  Service: Gastroenterology;  Laterality: N/A;   FINE NEEDLE ASPIRATION N/A 12/04/2022   Procedure: FINE NEEDLE ASPIRATION (FNA) LINEAR;  Surgeon: Lemar Lofty., MD;  Location: [facility];  Service: Gastroenterology;  Laterality: N/A;   finger surgery  2019   Dupuytren's contracture surgery   OTHER SURGICAL HISTORY     Stomach was not developed at birth    reports that she has never smoked. She has never used smokeless tobacco. She reports that she does not currently use alcohol. She reports that she does not use drugs. family history includes Arthritis in her mother; Emphysema in her mother; Heart disease (age of onset: 22) in her father; Heart disease (age of onset: 57) in her mother; Hyperlipidemia in her father and mother; Hypertension in her father and mother; Kidney cancer  in her mother; Prostate cancer in her father; Skin cancer in her mother. Allergies  Allergen Reactions   Doxycycline Nausea And Vomiting   Erythromycin Base Other (See Comments)    yeast infection, GI upset   Lactose Intolerance (Gi) Diarrhea    Upset stomach    Metronidazole Diarrhea, Itching and Nausea Only   Naproxen Other (See Comments)    GI upset   Prednisone Hives    anxiety, insomnia   Scallops [Shellfish Allergy] Nausea And Vomiting    Review of Systems  Constitutional:  Negative  for chills and fever.  HENT:  Positive for tinnitus. Negative for ear discharge, ear pain and hearing loss.   Eyes:  Negative for blurred vision.  Respiratory:  Negative for shortness of breath.   Cardiovascular:  Negative for chest pain.  Neurological:  Negative for dizziness, weakness and headaches.      Objective:     BP (!) 150/80 (BP Location: Left Arm, Patient Position: Sitting, Cuff Size: Normal)   Pulse 64   Temp 97.7 F (36.5 C) (Oral)   Ht 5' 5.75" (1.67 m)   Wt 162 lb 4.8 oz (73.6 kg)   SpO2 99%   BMI 26.40 kg/m  BP Readings from Last 3 Encounters:  06/12/23 (!) 150/80  06/01/23 130/70  03/18/23 138/68   Wt Readings from Last 3 Encounters:  06/12/23 162 lb 4.8 oz (73.6 kg)  06/01/23 158 lb 1.6 oz (71.7 kg)  03/18/23 161 lb 14.4 oz (73.4 kg)      Physical Exam Vitals reviewed.  Constitutional:      General: She is not in acute distress.    Appearance: She is not ill-appearing.  HENT:     Ears:     Comments: Ear canals are clear.  No cerumen.  No visible rash. Cardiovascular:     Rate and Rhythm: Normal rate and regular rhythm.  Pulmonary:     Effort: Pulmonary effort is normal.     Breath sounds: Normal breath sounds.  Skin:    Comments: Fading rash left cheek region.  Only faintly visible at this time.  Neurological:     Mental Status: She is alert.      No results found for any visits on 06/12/23.  Last CBC Lab Results  Component Value Date   WBC 6.7 03/18/2023   HGB 12.8 03/18/2023   HCT 39.2 03/18/2023   MCV 84.9 03/18/2023   MCH 28.0 12/05/2022   RDW 15.0 03/18/2023   PLT 241.0 03/18/2023   Last metabolic panel Lab Results  Component Value Date   GLUCOSE 96 03/18/2023   NA 139 03/18/2023   K 3.8 03/18/2023   CL 102 03/18/2023   CO2 31 03/18/2023   BUN 21 03/18/2023   CREATININE 0.72 03/18/2023   GFR 81.40 03/18/2023   CALCIUM 9.7 03/18/2023   PHOS 3.0 12/04/2022   PROT 6.7 03/18/2023   ALBUMIN 4.1 03/18/2023   BILITOT  0.8 03/18/2023   ALKPHOS 71 03/18/2023   AST 20 03/18/2023   ALT 15 03/18/2023   ANIONGAP 5 12/05/2022      The 10-year ASCVD risk score (Arnett DK, et al., 2019) is: 30.8%    Assessment & Plan:   #1 shingles involving left side of face.  Healing well.  Nerve pain improved.  No evidence for significant facial nerve involvement.  She has already completed Valtrex.  Continue gabapentin as needed at night and taper off as tolerated  #2 hypertension.  Poorly controlled today.  Today's reading is atypical for her.  Recommend close monitoring at home and if consistently over 140 systolic go back to whole tablet of losartan HCTZ.  Continue current dose of amlodipine.  #3 left sided tinnitus.  No acute hearing changes.  No pulsatile tinnitus.  Query whether related to her recent shingles.  No evidence for Ramsay Hunt syndrome.  Observe for now and consider audiology referral for any hearing changes  Evelena Peat, MD

## 2023-06-12 NOTE — Patient Instructions (Addendum)
Monitor blood pressure and be in touch if BP consistently > 140 systolic (top number).  If so, go back on one whole tablet of Losartan.

## 2023-06-16 ENCOUNTER — Ambulatory Visit
Admission: RE | Admit: 2023-06-16 | Discharge: 2023-06-16 | Disposition: A | Discharge status: Home / Self Care | Payer: Medicare HMO | Source: Ambulatory Visit | Attending: Surgery | Admitting: Surgery

## 2023-06-16 DIAGNOSIS — K862 Cyst of pancreas: Secondary | ICD-10-CM | POA: Diagnosis not present

## 2023-06-16 DIAGNOSIS — D490 Neoplasm of unspecified behavior of digestive system: Secondary | ICD-10-CM

## 2023-06-16 MED ORDER — GADOPICLENOL 0.5 MMOL/ML IV SOLN
7.0000 mL | Freq: Once | INTRAVENOUS | Status: AC | PRN
  Administered 2023-06-16: 7 mL via INTRAVENOUS

## 2023-07-08 ENCOUNTER — Encounter: Payer: Self-pay | Admitting: Family Medicine

## 2023-07-11 ENCOUNTER — Other Ambulatory Visit: Payer: Medicare HMO

## 2023-09-22 ENCOUNTER — Other Ambulatory Visit: Payer: Self-pay | Admitting: Family Medicine

## 2023-09-23 ENCOUNTER — Other Ambulatory Visit: Payer: Self-pay

## 2023-09-24 MED ORDER — MONTELUKAST SODIUM 10 MG PO TABS: 10.0000 mg | ORAL_TABLET | Freq: Every day | ORAL | 3 refills | Status: DC

## 2023-09-24 NOTE — Progress Notes (Signed)
 Rx sent

## 2023-09-30 ENCOUNTER — Other Ambulatory Visit: Payer: Self-pay | Admitting: Family Medicine

## 2023-10-05 DIAGNOSIS — H524 Presbyopia: Secondary | ICD-10-CM | POA: Diagnosis not present

## 2023-10-07 DIAGNOSIS — Z0100 Encounter for examination of eyes and vision without abnormal findings: Secondary | ICD-10-CM | POA: Diagnosis not present

## 2023-10-29 ENCOUNTER — Other Ambulatory Visit: Payer: Self-pay | Admitting: Family Medicine

## 2023-10-29 DIAGNOSIS — I10 Essential (primary) hypertension: Secondary | ICD-10-CM

## 2023-11-23 ENCOUNTER — Encounter: Payer: Self-pay | Admitting: Family Medicine

## 2023-11-23 ENCOUNTER — Other Ambulatory Visit (HOSPITAL_COMMUNITY): Payer: Self-pay

## 2023-11-23 MED ORDER — AZELASTINE HCL 137 MCG/SPRAY NA SOLN: 2.0000 | Freq: Two times a day (BID) | NASAL | 1 refills | Status: AC

## 2023-11-23 MED ORDER — AZELASTINE HCL 137 MCG/SPRAY NA SOLN
2.0000 | Freq: Two times a day (BID) | NASAL | 1 refills | Status: DC
  Filled 2023-11-23: qty 30, 25d supply, fill #0

## 2023-11-23 NOTE — Addendum Note (Signed)
 Addended by: Aurelio Leer on: 11/23/2023 10:19 AM   Modules accepted: Orders

## 2023-11-27 ENCOUNTER — Ambulatory Visit: Admitting: Family Medicine

## 2023-11-30 ENCOUNTER — Ambulatory Visit (INDEPENDENT_AMBULATORY_CARE_PROVIDER_SITE_OTHER): Admitting: Family Medicine

## 2023-11-30 ENCOUNTER — Encounter: Payer: Self-pay | Admitting: Family Medicine

## 2023-11-30 VITALS — BP 138/70 | HR 69 | Temp 98.0°F | Wt 159.4 lb

## 2023-11-30 DIAGNOSIS — R5383 Other fatigue: Secondary | ICD-10-CM

## 2023-11-30 DIAGNOSIS — R42 Dizziness and giddiness: Secondary | ICD-10-CM

## 2023-11-30 DIAGNOSIS — I10 Essential (primary) hypertension: Secondary | ICD-10-CM

## 2023-11-30 MED ORDER — TRAMADOL HCL 50 MG PO TABS: ORAL_TABLET | ORAL | 1 refills | Status: DC

## 2023-11-30 NOTE — Progress Notes (Signed)
 Established Patient Office Visit  Subjective   Patient ID: Rebecca Reid, female    DOB: February 06, 1946  Age: 78 y.o. MRN: [id number]  Chief Complaint  Patient presents with   Dizziness   Fatigue    HPI   Fama has history of hypertension, allergic rhinitis, GERD, history of pancreatitis, osteoarthritis, history of symptomatic PVCs.  She has lost some weight during the past year and has recently had some dizziness especially when changing positions and near syncope a couple times.  No associated chest pain.  Currently takes Toprol -XL, Norvasc , and losartan /HCTZ.  Her weight is down substantially from a year ago. No chest pains.  No recent palpitations.  Denies any night sweats.  She has a couple times had sudden sweats that occur during the day in the absence of any severe pain episodes.  In general she suffers with poor sleep frequently at night.  She had to make some dietary changes since her pancreatitis episode and has tendencies toward diarrhea with certain foods.  Currently having diarrhea episodes about once every other week.  She has slept substantially better in the past when she has taken tramadol  which she takes infrequently.  With some nonspecific general fatigue.  Past Medical History:  Diagnosis Date   ALLERGIC RHINITIS CAUSE UNSPECIFIED 04/30/2009   Anxiety    Arthritis    DEPRESSION, HX OF 04/11/2010   Dysuria 05/06/2010   Gallstones    GERD 04/30/2009   HYPERLIPIDEMIA 04/30/2009   HYPERTENSION 04/30/2009   Lactose intolerance    LEG EDEMA 12/12/2009   OSTEOPENIA 04/30/2009   PVC (premature ventricular contraction)    Status post dilation of esophageal narrowing    Past Surgical History:  Procedure Laterality Date   ABDOMINAL HYSTERECTOMY  1986   AUGMENTATION MAMMAPLASTY Bilateral    BIOPSY  12/04/2022   Procedure: BIOPSY;  Surgeon: Normie Becton., MD;  Location: [facility];  Service: Gastroenterology;;   BREAST ENHANCEMENT SURGERY  1987    BREAST SURGERY  1974   biopsy   CARDIAC CATHETERIZATION N/A 11/02/2015   Procedure: Left Heart Cath and Coronary Angiography;  Surgeon: Arty Binning, MD;  Location: [facility] INVASIVE CV LAB;  Service: Cardiovascular;  Laterality: N/A;   Carpel tunel Right    CATARACT EXTRACTION, BILATERAL Bilateral    one in feb and one in March Dr. Demetrios Finders   CHOLECYSTECTOMY  1974   ectopic pregnancy     ESOPHAGOGASTRODUODENOSCOPY N/A 12/04/2022   Procedure: ESOPHAGOGASTRODUODENOSCOPY (EGD);  Surgeon: Normie Becton., MD;  Location: [facility];  Service: Gastroenterology;  Laterality: N/A;   EUS N/A 12/04/2022   Procedure: UPPER ENDOSCOPIC ULTRASOUND (EUS) RADIAL;  Surgeon: Normie Becton., MD;  Location: [facility];  Service: Gastroenterology;  Laterality: N/A;   FINE NEEDLE ASPIRATION N/A 12/04/2022   Procedure: FINE NEEDLE ASPIRATION (FNA) LINEAR;  Surgeon: Normie Becton., MD;  Location: [facility];  Service: Gastroenterology;  Laterality: N/A;   finger surgery  2019   Dupuytren's contracture surgery   OTHER SURGICAL HISTORY     Stomach was not developed at birth    reports that she has never smoked. She has never used smokeless tobacco. She reports that she does not currently use alcohol. She reports that she does not use drugs. family history includes Arthritis in her mother; Emphysema in her mother; Heart disease (age of onset: 88) in her father; Heart disease (age of onset: 19) in her mother; Hyperlipidemia in her father and mother; Hypertension in her father and mother; Kidney  cancer in her mother; Prostate cancer in her father; Skin cancer in her mother. Allergies  Allergen Reactions   Doxycycline  Nausea And Vomiting   Erythromycin Base Other (See Comments)    yeast infection, GI upset   Lactose Intolerance (Gi) Diarrhea    Upset stomach    Metronidazole  Diarrhea, Itching and Nausea Only   Naproxen Other (See Comments)    GI upset   Prednisone Hives    anxiety, insomnia    Scallops [Shellfish Allergy] Nausea And Vomiting    Review of Systems  Constitutional:  Positive for malaise/fatigue. Negative for chills and fever.  Cardiovascular:  Negative for chest pain.  Gastrointestinal:  Positive for diarrhea.  Neurological:  Positive for dizziness. Negative for speech change and focal weakness.      Objective:     BP 138/70 (BP Location: Left Arm, Patient Position: Sitting, Cuff Size: Normal)   Pulse 69   Temp 98 F (36.7 C) (Oral)   Wt 159 lb 6.4 oz (72.3 kg)   SpO2 98%   BMI 25.92 kg/m  BP Readings from Last 3 Encounters:  11/30/23 138/70  06/12/23 (!) 150/80  06/01/23 130/70   Wt Readings from Last 3 Encounters:  11/30/23 159 lb 6.4 oz (72.3 kg)  06/12/23 162 lb 4.8 oz (73.6 kg)  06/01/23 158 lb 1.6 oz (71.7 kg)      Physical Exam Vitals reviewed.  Constitutional:      General: She is not in acute distress.    Appearance: She is not ill-appearing.  Cardiovascular:     Rate and Rhythm: Normal rate and regular rhythm.  Pulmonary:     Effort: Pulmonary effort is normal.     Breath sounds: Normal breath sounds. No wheezing or rales.  Musculoskeletal:     Right lower leg: No edema.     Left lower leg: No edema.  Neurological:     General: No focal deficit present.     Mental Status: She is alert.      No results found for any visits on 11/30/23.  Last CBC Lab Results  Component Value Date   WBC 6.7 03/18/2023   HGB 12.8 03/18/2023   HCT 39.2 03/18/2023   MCV 84.9 03/18/2023   MCH 28.0 12/05/2022   RDW 15.0 03/18/2023   PLT 241.0 03/18/2023   Last metabolic panel Lab Results  Component Value Date   GLUCOSE 96 03/18/2023   NA 139 03/18/2023   K 3.8 03/18/2023   CL 102 03/18/2023   CO2 31 03/18/2023   BUN 21 03/18/2023   CREATININE 0.72 03/18/2023   GFR 81.40 03/18/2023   CALCIUM  9.7 03/18/2023   PHOS 3.0 12/04/2022   PROT 6.7 03/18/2023   ALBUMIN 4.1 03/18/2023   BILITOT 0.8 03/18/2023   ALKPHOS 71 03/18/2023    AST 20 03/18/2023   ALT 15 03/18/2023   ANIONGAP 5 12/05/2022   Last thyroid  functions Lab Results  Component Value Date   TSH 4.40 04/28/2022      The 10-year ASCVD risk score (Arnett DK, et al., 2019) is: 26.7%    Assessment & Plan:   Problem List Items Addressed This Visit   None Visit Diagnoses       Dizziness    -  Primary   Relevant Orders   CBC with Differential/Platelet   Basic metabolic panel with GFR     Fatigue, unspecified type       Relevant Orders   TSH     Alyzae presents with  nonspecific symptoms of intermittent dizziness with standing up along with intermittent sweats of uncertain origin.  Her blood pressure today seated after rest 118/78 and standing 114/78.  She has lost substantial weight this year and has gotten some home readings less than 100 systolic and suspect her dizziness may be related to occasional low readings related to her weight loss.  -Check labs with TSH, CBC, basic metabolic panel - Stay well-hydrated - Consider discontinuing losartan  HCTZ and perhaps continue with plain HCTZ 12.5 mg daily if electrolytes stable - Agreed to refill tramadol  which she takes infrequently at night for osteoarthritis pains  No follow-ups on file.    Glean Lamy, MD

## 2023-12-01 ENCOUNTER — Ambulatory Visit: Payer: Self-pay | Admitting: Family Medicine

## 2023-12-01 LAB — BASIC METABOLIC PANEL WITH GFR
BUN: 21 mg/dL (ref 6–23)
CO2: 29 meq/L (ref 19–32)
Calcium: 9.5 mg/dL (ref 8.4–10.5)
Chloride: 102 meq/L (ref 96–112)
Creatinine, Ser: 0.84 mg/dL (ref 0.40–1.20)
GFR: 67.31 mL/min (ref >60.00)
Glucose, Bld: 85 mg/dL (ref 70–99)
Potassium: 3.1 meq/L — ABNORMAL LOW (ref 3.5–5.1)
Sodium: 139 meq/L (ref 135–145)

## 2023-12-01 LAB — CBC WITH DIFFERENTIAL/PLATELET
Basophils Absolute: 0.1 10*3/uL (ref 0.0–0.1)
Basophils Relative: 0.9 % (ref 0.0–3.0)
Eosinophils Absolute: 0.1 10*3/uL (ref 0.0–0.7)
Eosinophils Relative: 1.3 % (ref 0.0–5.0)
HCT: 39.3 % (ref 36.0–46.0)
Hemoglobin: 13.1 g/dL (ref 12.0–15.0)
Lymphocytes Relative: 22.1 % (ref 12.0–46.0)
Lymphs Abs: 1.5 10*3/uL (ref 0.7–4.0)
MCHC: 33.3 g/dL (ref 30.0–36.0)
MCV: 84.5 fl (ref 78.0–100.0)
Monocytes Absolute: 0.5 10*3/uL (ref 0.1–1.0)
Monocytes Relative: 7.8 % (ref 3.0–12.0)
Neutro Abs: 4.7 10*3/uL (ref 1.4–7.7)
Neutrophils Relative %: 67.9 % (ref 43.0–77.0)
Platelets: 260 10*3/uL (ref 150.0–400.0)
RBC: 4.66 Mil/uL (ref 3.87–5.11)
RDW: 15 % (ref 11.5–15.5)
WBC: 7 10*3/uL (ref 4.0–10.5)

## 2023-12-01 LAB — TSH: TSH: 1.19 u[IU]/mL (ref 0.35–5.50)

## 2023-12-01 MED ORDER — LOSARTAN POTASSIUM 50 MG PO TABS: 50.0000 mg | ORAL_TABLET | Freq: Every day | ORAL | 0 refills | Status: DC

## 2023-12-01 NOTE — Addendum Note (Signed)
 Addended by: Aurelio Leer on: 12/01/2023 01:09 PM   Modules accepted: Orders

## 2023-12-28 ENCOUNTER — Ambulatory Visit: Admitting: Family Medicine

## 2023-12-29 ENCOUNTER — Other Ambulatory Visit: Payer: Self-pay | Admitting: Family Medicine

## 2024-01-19 ENCOUNTER — Ambulatory Visit: Admitting: Family Medicine

## 2024-01-19 ENCOUNTER — Ambulatory Visit: Payer: Self-pay | Admitting: Family Medicine

## 2024-01-19 ENCOUNTER — Encounter: Payer: Self-pay | Admitting: Family Medicine

## 2024-01-19 VITALS — BP 140/80 | HR 66 | Temp 97.6°F | Wt 163.6 lb

## 2024-01-19 DIAGNOSIS — M7051 Other bursitis of knee, right knee: Secondary | ICD-10-CM

## 2024-01-19 DIAGNOSIS — E876 Hypokalemia: Secondary | ICD-10-CM | POA: Diagnosis not present

## 2024-01-19 DIAGNOSIS — I10 Essential (primary) hypertension: Secondary | ICD-10-CM

## 2024-01-19 LAB — BASIC METABOLIC PANEL WITH GFR
BUN: 23 mg/dL (ref 6–23)
CO2: 31 meq/L (ref 19–32)
Calcium: 9.6 mg/dL (ref 8.4–10.5)
Chloride: 104 meq/L (ref 96–112)
Creatinine, Ser: 0.73 mg/dL (ref 0.40–1.20)
GFR: 79.59 mL/min (ref >60.00)
Glucose, Bld: 83 mg/dL (ref 70–99)
Potassium: 4.3 meq/L (ref 3.5–5.1)
Sodium: 140 meq/L (ref 135–145)

## 2024-01-19 MED ORDER — METHYLPREDNISOLONE ACETATE 40 MG/ML IJ SUSP
20.0000 mg | Freq: Once | INTRAMUSCULAR | Status: AC
  Administered 2024-01-19: 20 mg via INTRA_ARTICULAR

## 2024-01-19 NOTE — Patient Instructions (Signed)
 Monitor blood pressure and be in touch if consistently > 140 systolic.    Consider OTC Diclofenac /Voltaren  gel.

## 2024-01-19 NOTE — Progress Notes (Signed)
 Established Patient Office Visit  Subjective   Patient ID: Rebecca Reid, female    DOB: 1945-07-25  Age: 78 y.o. MRN: [id number]  No chief complaint on file.   HPI   Rebecca Reid is seen today for several items as follows.  She had recent labs with potassium 3.1.  We discontinued her losartan  HCTZ and switched to plain losartan .  Home blood pressures are generally well-controlled.  Occasional reading around 140 systolic but mostly 120s to 130s systolic.  She also takes amlodipine  5 mg daily and Toprol -XL.  She has tried to increase potassium rich foods.  Does have occasional muscle cramps.  She has had some recent pain just below her right knee region.  Denies any injury.  She tried some Biofreeze but did not see much improvement if any.  Has not noted any redness or warmth.  Starting to alter gait and some night pain.   Past Medical History:  Diagnosis Date   ALLERGIC RHINITIS CAUSE UNSPECIFIED 04/30/2009   Anxiety    Arthritis    DEPRESSION, HX OF 04/11/2010   Dysuria 05/06/2010   Gallstones    GERD 04/30/2009   HYPERLIPIDEMIA 04/30/2009   HYPERTENSION 04/30/2009   Lactose intolerance    LEG EDEMA 12/12/2009   OSTEOPENIA 04/30/2009   PVC (premature ventricular contraction)    Status post dilation of esophageal narrowing    Past Surgical History:  Procedure Laterality Date   ABDOMINAL HYSTERECTOMY  1986   AUGMENTATION MAMMAPLASTY Bilateral    BIOPSY  12/04/2022   Procedure: BIOPSY;  Surgeon: Wilhelmenia Aloha Raddle., MD;  Location: [facility];  Service: Gastroenterology;;   BREAST ENHANCEMENT SURGERY  1987   BREAST SURGERY  1974   biopsy   CARDIAC CATHETERIZATION N/A 11/02/2015   Procedure: Left Heart Cath and Coronary Angiography;  Surgeon: Victory LELON Sharps, MD;  Location: [facility] INVASIVE CV LAB;  Service: Cardiovascular;  Laterality: N/A;   Carpel tunel Right    CATARACT EXTRACTION, BILATERAL Bilateral    one in feb and one in March Dr. Milan   CHOLECYSTECTOMY  1974    ectopic pregnancy     ESOPHAGOGASTRODUODENOSCOPY N/A 12/04/2022   Procedure: ESOPHAGOGASTRODUODENOSCOPY (EGD);  Surgeon: Wilhelmenia Aloha Raddle., MD;  Location: [facility];  Service: Gastroenterology;  Laterality: N/A;   EUS N/A 12/04/2022   Procedure: UPPER ENDOSCOPIC ULTRASOUND (EUS) RADIAL;  Surgeon: Wilhelmenia Aloha Raddle., MD;  Location: [facility];  Service: Gastroenterology;  Laterality: N/A;   FINE NEEDLE ASPIRATION N/A 12/04/2022   Procedure: FINE NEEDLE ASPIRATION (FNA) LINEAR;  Surgeon: Wilhelmenia Aloha Raddle., MD;  Location: [facility];  Service: Gastroenterology;  Laterality: N/A;   finger surgery  2019   Dupuytren's contracture surgery   OTHER SURGICAL HISTORY     Stomach was not developed at birth    reports that she has never smoked. She has never used smokeless tobacco. She reports that she does not currently use alcohol. She reports that she does not use drugs. family history includes Arthritis in her mother; Emphysema in her mother; Heart disease (age of onset: 33) in her father; Heart disease (age of onset: 66) in her mother; Hyperlipidemia in her father and mother; Hypertension in her father and mother; Kidney cancer in her mother; Prostate cancer in her father; Skin cancer in her mother. Allergies  Allergen Reactions   Doxycycline  Nausea And Vomiting   Erythromycin Base Other (See Comments)    yeast infection, GI upset   Lactose Intolerance (Gi) Diarrhea    Upset stomach  Metronidazole  Diarrhea, Itching and Nausea Only   Naproxen Other (See Comments)    GI upset   Prednisone Hives    anxiety, insomnia   Scallops [Shellfish Allergy] Nausea And Vomiting    Review of Systems  Constitutional:  Negative for chills, fever and malaise/fatigue.  Eyes:  Negative for blurred vision.  Respiratory:  Negative for shortness of breath.   Cardiovascular:  Negative for chest pain.  Neurological:  Negative for dizziness, weakness and headaches.      Objective:     BP  (!) 140/80 (BP Location: Left Arm, Patient Position: Sitting, Cuff Size: Normal)   Pulse 66   Temp 97.6 F (36.4 C) (Oral)   Wt 163 lb 9.6 oz (74.2 kg)   SpO2 96%   BMI 26.61 kg/m  BP Readings from Last 3 Encounters:  01/19/24 (!) 140/80  11/30/23 138/70  06/12/23 (!) 150/80   Wt Readings from Last 3 Encounters:  01/19/24 163 lb 9.6 oz (74.2 kg)  11/30/23 159 lb 6.4 oz (72.3 kg)  06/12/23 162 lb 4.8 oz (73.6 kg)      Physical Exam Vitals reviewed.  Constitutional:      General: She is not in acute distress.    Appearance: She is well-developed. She is not ill-appearing.   Eyes:     Pupils: Pupils are equal, round, and reactive to light.   Neck:     Thyroid : No thyromegaly.     Vascular: No JVD.   Cardiovascular:     Rate and Rhythm: Normal rate and regular rhythm.     Heart sounds:     No gallop.  Pulmonary:     Effort: Pulmonary effort is normal. No respiratory distress.     Breath sounds: Normal breath sounds. No wheezing or rales.   Musculoskeletal:     Cervical back: Neck supple.     Right lower leg: No edema.     Left lower leg: No edema.     Comments: Full range of motion right knee.  No joint line tenderness.  She does have some tenderness over the Pez anserine bursa region.  No erythema or warmth.   Neurological:     Mental Status: She is alert.      No results found for any visits on 01/19/24.  Last CBC Lab Results  Component Value Date   WBC 7.0 11/30/2023   HGB 13.1 11/30/2023   HCT 39.3 11/30/2023   MCV 84.5 11/30/2023   MCH 28.0 12/05/2022   RDW 15.0 11/30/2023   PLT 260.0 11/30/2023   Last metabolic panel Lab Results  Component Value Date   GLUCOSE 85 11/30/2023   NA 139 11/30/2023   K 3.1 (L) 11/30/2023   CL 102 11/30/2023   CO2 29 11/30/2023   BUN 21 11/30/2023   CREATININE 0.84 11/30/2023   GFR 67.31 11/30/2023   CALCIUM  9.5 11/30/2023   PHOS 3.0 12/04/2022   PROT 6.7 03/18/2023   ALBUMIN 4.1 03/18/2023   BILITOT 0.8  03/18/2023   ALKPHOS 71 03/18/2023   AST 20 03/18/2023   ALT 15 03/18/2023   ANIONGAP 5 12/05/2022   Last lipids Lab Results  Component Value Date   CHOL 214 (H) 03/18/2023   HDL 59.90 03/18/2023   LDLCALC 134 (H) 03/18/2023   LDLDIRECT 159.5 05/18/2013   TRIG 101.0 03/18/2023   CHOLHDL 4 03/18/2023      The 89-bzjm ASCVD risk score (Arnett DK, et al., 2019) is: 27.4%    Assessment & Plan:   #  1 recent hypokalemia.  Patient recently taken off HCTZ.  Recheck basic metabolic panel.  Discussed potassium rich foods  #2 hypertension.  Slightly up today but controlled by home readings.  Continue close monitoring.  Be in touch if systolic consistently over 140.  Consider further titration of losartan  if she is getting systolics over 140  #3 Pez anserine bursitis right knee.  She has tried topical Biofreeze without improvement.  We discussed other options such as icing and topical Voltaren .  She would like to consider steroid injection.  She is aware of risk including bruising and low risk of infection.  Patient consented.  Prepped skin with Betadine.  Using 25-gauge 5/8 needle injected 20 mg of Depo-Medrol  and 2 cc of plain Xylocaine .  Patient tolerated well.  Be in touch for any persistent pain   Wolm Scarlet, MD

## 2024-02-03 ENCOUNTER — Encounter: Payer: Self-pay | Admitting: Family Medicine

## 2024-02-10 ENCOUNTER — Encounter: Payer: Self-pay | Admitting: Family Medicine

## 2024-02-10 DIAGNOSIS — H43813 Vitreous degeneration, bilateral: Secondary | ICD-10-CM | POA: Diagnosis not present

## 2024-02-10 DIAGNOSIS — H938X9 Other specified disorders of ear, unspecified ear: Secondary | ICD-10-CM

## 2024-02-10 DIAGNOSIS — H9312 Tinnitus, left ear: Secondary | ICD-10-CM

## 2024-02-12 ENCOUNTER — Other Ambulatory Visit: Payer: Self-pay | Admitting: Family Medicine

## 2024-02-22 ENCOUNTER — Ambulatory Visit (INDEPENDENT_AMBULATORY_CARE_PROVIDER_SITE_OTHER): Admitting: Family Medicine

## 2024-02-22 ENCOUNTER — Encounter: Payer: Self-pay | Admitting: Family Medicine

## 2024-02-22 VITALS — BP 142/76 | HR 67 | Temp 97.8°F | Wt 161.2 lb

## 2024-02-22 DIAGNOSIS — H538 Other visual disturbances: Secondary | ICD-10-CM

## 2024-02-22 DIAGNOSIS — I10 Essential (primary) hypertension: Secondary | ICD-10-CM | POA: Diagnosis not present

## 2024-02-22 MED ORDER — TELMISARTAN 80 MG PO TABS: 80.0000 mg | ORAL_TABLET | Freq: Every day | ORAL | 5 refills | Status: DC

## 2024-02-22 NOTE — Patient Instructions (Addendum)
 STOP the Losartan  and start the Telmisartan  in place of it  Set up one month follow up.

## 2024-02-22 NOTE — Progress Notes (Signed)
 Established Patient Office Visit  Subjective   Patient ID: Rebecca Reid, female    DOB: 06-24-1946  Age: 78 y.o. MRN: [id number]  Chief Complaint  Patient presents with   Medical Management of Chronic Issues    HPI   Rebecca Reid is here predominantly to discuss blood pressure issues.  She currently takes losartan  recently increased to 100 mg daily along with amlodipine  5 mg daily.  Also takes Toprol -XL 50 mg once daily.  Previously took HCTZ but had some hypotensive episodes.  She states her recent blood pressures have been somewhat up-and-down.  She has had readings as low as 114/75 but also several readings in the 140s even to 150s occasionally.  Has made several positive lifestyle changes.  Has lost substantial amount of weight past couple years due to her efforts.  Has been doing some water aerobics.  Eating much healthier diet.  Watching sodium intake closely.  Recently was at water aerobics and had acute bilateral blurred vision.  She got out of the water and after several minutes symptoms clear.  She was seen by ophthalmologist about 4 days later and eye exam unremarkable.  No retina difficulties.  No recent diplopia.  No further visual symptoms since then.  Symptoms were bilateral.  No focal weakness or speech changes. Past Medical History:  Diagnosis Date   ALLERGIC RHINITIS CAUSE UNSPECIFIED 04/30/2009   Anxiety    Arthritis    DEPRESSION, HX OF 04/11/2010   Dysuria 05/06/2010   Gallstones    GERD 04/30/2009   HYPERLIPIDEMIA 04/30/2009   HYPERTENSION 04/30/2009   Lactose intolerance    LEG EDEMA 12/12/2009   OSTEOPENIA 04/30/2009   PVC (premature ventricular contraction)    Status post dilation of esophageal narrowing    Past Surgical History:  Procedure Laterality Date   ABDOMINAL HYSTERECTOMY  1986   AUGMENTATION MAMMAPLASTY Bilateral    BIOPSY  12/04/2022   Procedure: BIOPSY;  Surgeon: Wilhelmenia Aloha Raddle., MD;  Location: [facility];  Service:  Gastroenterology;;   BREAST ENHANCEMENT SURGERY  1987   BREAST SURGERY  1974   biopsy   CARDIAC CATHETERIZATION N/A 11/02/2015   Procedure: Left Heart Cath and Coronary Angiography;  Surgeon: Victory LELON Sharps, MD;  Location: [facility] INVAS[facility] CV LAB;  Service: Cardiovascular;  Laterality: N/A;   Carpel tunel Right    CATARACT EXTRACTION, BILATERAL Bilateral    one in feb and one in March Dr. Milan   CHOLECYSTECTOMY  1974   ectopic pregnancy     ESOPHAGOGASTRODUODENOSCOPY N/A 12/04/2022   Procedure: ESOPHAGOGASTRODUODENOSCOPY (EGD);  Surgeon: Wilhelmenia Aloha Raddle., MD;  Location: [facility];  Service: Gastroenterology;  Laterality: N/A;   EUS N/A 12/04/2022   Procedure: UPPER ENDOSCOPIC ULTRASOUND (EUS) RADIAL;  Surgeon: Wilhelmenia Aloha Raddle., MD;  Location: [facility];  Service: Gastroenterology;  Laterality: N/A;   FINE NEEDLE ASPIRATION N/A 12/04/2022   Procedure: FINE NEEDLE ASPIRATION (FNA) LINEAR;  Surgeon: Wilhelmenia Aloha Raddle., MD;  Location: [facility];  Service: Gastroenterology;  Laterality: N/A;   finger surgery  2019   Dupuytren's contracture surgery   OTHER SURGICAL HISTORY     Stomach was not developed at birth    reports that she has never smoked. She has never used smokeless tobacco. She reports that she does not currently use alcohol. She reports that she does not use drugs. family history includes Arthritis in her mother; Emphysema in her mother; Heart disease (age of onset: 36) in her father; Heart disease (age of onset: 37) in her  mother; Hyperlipidemia in her father and mother; Hypertension in her father and mother; Kidney cancer in her mother; Prostate cancer in her father; Skin cancer in her mother. Allergies  Allergen Reactions   Doxycycline  Nausea And Vomiting   Erythromycin Base Other (See Comments)    yeast infection, GI upset   Lactose Intolerance (Gi) Diarrhea    Upset stomach    Lisinopril Cough    Also had some itching     Metronidazole  Diarrhea,  Itching and Nausea Only   Naproxen Other (See Comments)    GI upset   Prednisone Hives    anxiety, insomnia   Scallops [Shellfish Allergy] Nausea And Vomiting    Review of Systems  Constitutional:  Negative for malaise/fatigue.  Eyes:        See HPI  Respiratory:  Negative for shortness of breath.   Cardiovascular:  Negative for chest pain.  Neurological:  Negative for dizziness, weakness and headaches.      Objective:     BP (!) 142/76   Pulse 67   Temp 97.8 F (36.6 C) (Oral)   Wt 161 lb 3.2 oz (73.1 kg)   SpO2 96%   BMI 26.22 kg/m  BP Readings from Last 3 Encounters:  02/22/24 (!) 142/76  01/19/24 (!) 140/80  11/30/23 138/70   Wt Readings from Last 3 Encounters:  02/22/24 161 lb 3.2 oz (73.1 kg)  01/19/24 163 lb 9.6 oz (74.2 kg)  11/30/23 159 lb 6.4 oz (72.3 kg)      Physical Exam Vitals reviewed.  Constitutional:      General: She is not in acute distress.    Appearance: She is well-developed.  Eyes:     Pupils: Pupils are equal, round, and reactive to light.  Neck:     Thyroid : No thyromegaly.     Vascular: No JVD.  Cardiovascular:     Rate and Rhythm: Normal rate and regular rhythm.     Heart sounds:     No gallop.  Pulmonary:     Effort: Pulmonary effort is normal. No respiratory distress.     Breath sounds: Normal breath sounds. No wheezing or rales.  Musculoskeletal:     Cervical back: Neck supple.     Right lower leg: No edema.     Left lower leg: No edema.  Neurological:     Mental Status: She is alert.      No results found for any visits on 02/22/24.  Last CBC Lab Results  Component Value Date   WBC 7.0 11/30/2023   HGB 13.1 11/30/2023   HCT 39.3 11/30/2023   MCV 84.5 11/30/2023   MCH 28.0 12/05/2022   RDW 15.0 11/30/2023   PLT 260.0 11/30/2023   Last metabolic panel Lab Results  Component Value Date   GLUCOSE 83 01/19/2024   NA 140 01/19/2024   K 4.3 01/19/2024   CL 104 01/19/2024   CO2 31 01/19/2024   BUN 23  01/19/2024   CREATININE 0.73 01/19/2024   GFR 79.59 01/19/2024   CALCIUM  9.6 01/19/2024   PHOS 3.0 12/04/2022   PROT 6.7 03/18/2023   ALBUMIN 4.1 03/18/2023   BILITOT 0.8 03/18/2023   ALKPHOS 71 03/18/2023   AST 20 03/18/2023   ALT 15 03/18/2023   ANIONGAP 5 12/05/2022   Last lipids Lab Results  Component Value Date   CHOL 214 (H) 03/18/2023   HDL 59.90 03/18/2023   LDLCALC 134 (H) 03/18/2023   LDLDIRECT 159.5 05/18/2013   TRIG 101.0 03/18/2023  CHOLHDL 4 03/18/2023   Last thyroid  functions Lab Results  Component Value Date   TSH 1.19 11/30/2023      The 10-year ASCVD risk score (Arnett DK, et al., 2019) is: 30.9%    Assessment & Plan:   #1 hypertension suboptimally controlled by multiple home readings.  Blood pressure also up here today.  Repeat left arm seated after rest 142/78.  She has had some readings below 120 systolic but also several 140s to 150s systolic.  Previous intolerance with ACE inhibitor and HCTZ.  Continue amlodipine .  She is concerned about possible increased edema with higher dose amlodipine .  We did discuss possible change to more potent ARB such as telmisartan  80 mg daily.  Handout on DASH diet given.  Set up 1 month follow-up.  Continue to monitor at home.  Continue regular aerobic exercise  #2 recent transient blurred vision bilaterally.?  Transient iritis related to chlorine.  She saw ophthalmologist and had normal eye exam with no retinal difficulties.  Follow-up for any recurrent symptoms  Wolm Scarlet, MD

## 2024-03-18 ENCOUNTER — Ambulatory Visit: Admitting: Family Medicine

## 2024-03-18 ENCOUNTER — Encounter: Payer: Self-pay | Admitting: Family Medicine

## 2024-03-18 VITALS — BP 146/84 | HR 66 | Temp 98.1°F | Wt 162.2 lb

## 2024-03-18 DIAGNOSIS — M7051 Other bursitis of knee, right knee: Secondary | ICD-10-CM

## 2024-03-18 DIAGNOSIS — I10 Essential (primary) hypertension: Secondary | ICD-10-CM

## 2024-03-18 MED ORDER — TELMISARTAN 80 MG PO TABS: 80.0000 mg | ORAL_TABLET | Freq: Every day | ORAL | 3 refills | Status: AC

## 2024-03-18 MED ORDER — METHYLPREDNISOLONE ACETATE 40 MG/ML IJ SUSP
20.0000 mg | Freq: Once | INTRAMUSCULAR | Status: AC
  Administered 2024-03-18: 20 mg via INTRA_ARTICULAR

## 2024-03-18 NOTE — Patient Instructions (Signed)
 Bring in your BP cuff when you come for physical.

## 2024-03-18 NOTE — Progress Notes (Signed)
 Established Patient Office Visit  Subjective   Patient ID: Rebecca Reid, female    DOB: [birth year]  Age: 78 y.o. MRN: [id number]  Chief Complaint  Patient presents with   Medical Management of Chronic Issues    HPI   Rebecca Reid is here for chronic medical management/follow-up.  Hypertension history.  We recently added Micardis  80 mg daily to her regimen which already included amlodipine  and metoprolol .  Home blood pressures fairly consistently 120s systolic and 70-80 diastolic.  Prior to starting Micardis  she had systolics fairly frequently 140s and 150s.  She is watching her sodium intake closely.  Other issue is pain right Pes anserine bursa region.  She has had similar problems in the past.  Previously benefited from steroid injection She has fairly severe pain at times.  Denies any recent injury.  Blurred vision symptoms from last visit have improved some.  She wonders if this may have been due to increased chlorine in the pool water.  Past Medical History:  Diagnosis Date   ALLERGIC RHINITIS CAUSE UNSPECIFIED 04/30/2009   Anxiety    Arthritis    DEPRESSION, HX OF 04/11/2010   Dysuria 05/06/2010   Gallstones    GERD 04/30/2009   HYPERLIPIDEMIA 04/30/2009   HYPERTENSION 04/30/2009   Lactose intolerance    LEG EDEMA 12/12/2009   OSTEOPENIA 04/30/2009   PVC (premature ventricular contraction)    Status post dilation of esophageal narrowing    Past Surgical History:  Procedure Laterality Date   ABDOMINAL HYSTERECTOMY  1986   AUGMENTATION MAMMAPLASTY Bilateral    BIOPSY  12/04/2022   Procedure: BIOPSY;  Surgeon: Wilhelmenia Aloha Raddle., MD;  Location: [facility];  Service: Gastroenterology;;   BREAST ENHANCEMENT SURGERY  1987   BREAST SURGERY  1974   biopsy   CARDIAC CATHETERIZATION N/A 11/02/2015   Procedure: Left Heart Cath and Coronary Angiography;  Surgeon: Victory LELON Sharps, MD;  Location: [facility] INVASIVE CV LAB;  Service: Cardiovascular;  Laterality: N/A;   Carpel tunel  Right    CATARACT EXTRACTION, BILATERAL Bilateral    one in feb and one in March Dr. Milan   CHOLECYSTECTOMY  1974   ectopic pregnancy     ESOPHAGOGASTRODUODENOSCOPY N/A 12/04/2022   Procedure: ESOPHAGOGASTRODUODENOSCOPY (EGD);  Surgeon: Wilhelmenia Aloha Raddle., MD;  Location: [facility];  Service: Gastroenterology;  Laterality: N/A;   EUS N/A 12/04/2022   Procedure: UPPER ENDOSCOPIC ULTRASOUND (EUS) RADIAL;  Surgeon: Wilhelmenia Aloha Raddle., MD;  Location: [facility];  Service: Gastroenterology;  Laterality: N/A;   FINE NEEDLE ASPIRATION N/A 12/04/2022   Procedure: FINE NEEDLE ASPIRATION (FNA) LINEAR;  Surgeon: Wilhelmenia Aloha Raddle., MD;  Location: [facility];  Service: Gastroenterology;  Laterality: N/A;   finger surgery  2019   Dupuytren's contracture surgery   OTHER SURGICAL HISTORY     Stomach was not developed at birth    reports that she has never smoked. She has never used smokeless tobacco. She reports that she does not currently use alcohol. She reports that she does not use drugs. family history includes Arthritis in her mother; Emphysema in her mother; Heart disease (age of onset: 65) in her father; Heart disease (age of onset: 38) in her mother; Hyperlipidemia in her father and mother; Hypertension in her father and mother; Kidney cancer in her mother; Prostate cancer in her father; Skin cancer in her mother. Allergies  Allergen Reactions   Doxycycline  Nausea And Vomiting   Erythromycin Base Other (See Comments)    yeast infection, GI upset  Lactose Intolerance (Gi) Diarrhea    Upset stomach    Lisinopril Cough    Also had some itching     Metronidazole  Diarrhea, Itching and Nausea Only   Naproxen Other (See Comments)    GI upset   Prednisone Hives    anxiety, insomnia   Scallops [Shellfish Allergy] Nausea And Vomiting    Review of Systems  Constitutional:  Negative for malaise/fatigue.  Eyes:  Negative for blurred vision.  Respiratory:  Negative for  shortness of breath.   Cardiovascular:  Negative for chest pain.  Neurological:  Negative for dizziness, weakness and headaches.      Objective:     BP (!) 146/84   Pulse 66   Temp 98.1 F (36.7 C) (Oral)   Wt 162 lb 3.2 oz (73.6 kg)   SpO2 98%   BMI 26.38 kg/m  BP Readings from Last 3 Encounters:  03/18/24 (!) 146/84  02/22/24 (!) 142/76  01/19/24 (!) 140/80   Wt Readings from Last 3 Encounters:  03/18/24 162 lb 3.2 oz (73.6 kg)  02/22/24 161 lb 3.2 oz (73.1 kg)  01/19/24 163 lb 9.6 oz (74.2 kg)      Physical Exam Vitals reviewed.  Constitutional:      Appearance: She is well-developed.  Eyes:     Pupils: Pupils are equal, round, and reactive to light.  Neck:     Thyroid : No thyromegaly.     Vascular: No JVD.  Cardiovascular:     Rate and Rhythm: Normal rate and regular rhythm.     Heart sounds:     No gallop.  Pulmonary:     Effort: Pulmonary effort is normal. No respiratory distress.     Breath sounds: Normal breath sounds. No wheezing or rales.  Musculoskeletal:     Cervical back: Neck supple.     Comments: Right knee reveals no effusion.  She has some tenderness in the Pes anserine bursa region.  No warmth or erythema.  Neurological:     Mental Status: She is alert.      No results found for any visits on 03/18/24.    The 10-year ASCVD risk score (Arnett DK, et al., 2019) is: 32.3%    Assessment & Plan:   #1 hypertension.  Blood pressure slightly up today but improved by home readings.  She is getting mostly 120s systolic and 70s to 80s diastolic with recent addition of telmisartan  to her amlodipine  and metoprolol .  Continue current regimen.  Continue monitoring.  Continue low-sodium diet.  We did suggest she bring in her cuff to compare with ours at next visit  #2 Pes anserine bursitis right knee.  Patient has not seen much improvement with icing and other conservative therapies.  We discussed possible steroid injection which has helped her  previously.  She is aware of risk of bleeding, bruising, low risk of infection and consented.  Prepped skin with Betadine.  Using 25-gauge 1 inch needle injected 20 mg of Depo-Medrol  and 1 cc of plain Xylocaine  and patient tolerated well.  Be in touch for any persistent pain   No follow-ups on file.    Wolm Scarlet, MD

## 2024-03-22 ENCOUNTER — Ambulatory Visit: Admitting: Family Medicine

## 2024-04-01 ENCOUNTER — Other Ambulatory Visit: Payer: Self-pay | Admitting: Family Medicine

## 2024-04-11 ENCOUNTER — Other Ambulatory Visit: Payer: Self-pay | Admitting: Family Medicine

## 2024-04-11 DIAGNOSIS — I10 Essential (primary) hypertension: Secondary | ICD-10-CM

## 2024-04-13 ENCOUNTER — Ambulatory Visit (INDEPENDENT_AMBULATORY_CARE_PROVIDER_SITE_OTHER)

## 2024-04-13 DIAGNOSIS — Z23 Encounter for immunization: Secondary | ICD-10-CM | POA: Diagnosis not present

## 2024-04-20 ENCOUNTER — Encounter (INDEPENDENT_AMBULATORY_CARE_PROVIDER_SITE_OTHER): Payer: Self-pay | Admitting: Otolaryngology

## 2024-04-20 ENCOUNTER — Ambulatory Visit (INDEPENDENT_AMBULATORY_CARE_PROVIDER_SITE_OTHER): Admitting: Otolaryngology

## 2024-04-20 VITALS — BP 132/79 | HR 70 | Temp 98.2°F

## 2024-04-20 DIAGNOSIS — H9193 Unspecified hearing loss, bilateral: Secondary | ICD-10-CM

## 2024-04-20 DIAGNOSIS — H9190 Unspecified hearing loss, unspecified ear: Secondary | ICD-10-CM

## 2024-04-20 DIAGNOSIS — H6990 Unspecified Eustachian tube disorder, unspecified ear: Secondary | ICD-10-CM | POA: Diagnosis not present

## 2024-04-20 DIAGNOSIS — H9319 Tinnitus, unspecified ear: Secondary | ICD-10-CM | POA: Diagnosis not present

## 2024-04-20 DIAGNOSIS — H9313 Tinnitus, bilateral: Secondary | ICD-10-CM

## 2024-04-20 DIAGNOSIS — H938X3 Other specified disorders of ear, bilateral: Secondary | ICD-10-CM

## 2024-04-20 NOTE — Progress Notes (Signed)
 ENT CONSULT:  Reason for Consult: ear fullness tinnitus    HPI: Discussed the use of AI scribe software for clinical note transcription with the patient, who gave verbal consent to proceed.  History of Present Illness Rebecca Reid is a 78 year old female who presents with ear fullness and worsening tinnitus.  She experiences a sensation of pressure in her ears, describing it as feeling like she is 'in a tunnel' or similar to 'swimmer's ear.' This sensation has been persistent and is accompanied by worsening tinnitus, which she has had for years. She is concerned about her hearing.  She has a history of allergies to dust and pollen, managed with a daily antihistamine and a nasal spray prescribed by another doctor. She does not use the nasal spray daily due to dryness, opting to use it as needed.  She previously lived in [place]  where her allergies were more severe.     Past Medical History:  Diagnosis Date   ALLERGIC RHINITIS CAUSE UNSPECIFIED 04/30/2009   Anxiety    Arthritis    DEPRESSION, HX OF 04/11/2010   Dysuria 05/06/2010   Gallstones    GERD 04/30/2009   HYPERLIPIDEMIA 04/30/2009   HYPERTENSION 04/30/2009   Lactose intolerance    LEG EDEMA 12/12/2009   OSTEOPENIA 04/30/2009   PVC (premature ventricular contraction)    Status post dilation of esophageal narrowing     Past Surgical History:  Procedure Laterality Date   ABDOMINAL HYSTERECTOMY  1986   AUGMENTATION MAMMAPLASTY Bilateral    BIOPSY  12/04/2022   Procedure: BIOPSY;  Surgeon: Wilhelmenia Aloha Raddle., MD;  Location: [facility];  Service: Gastroenterology;;   BREAST ENHANCEMENT SURGERY  1987   BREAST SURGERY  1974   biopsy   CARDIAC CATHETERIZATION N/A 11/02/2015   Procedure: Left Heart Cath and Coronary Angiography;  Surgeon: Victory LELON Sharps, MD;  Location: [facility] INVASIVE CV LAB;  Service: Cardiovascular;  Laterality: N/A;   Carpel tunel Right    CATARACT EXTRACTION, BILATERAL Bilateral    one in feb and  one in March Dr. Milan   CHOLECYSTECTOMY  1974   ectopic pregnancy     ESOPHAGOGASTRODUODENOSCOPY N/A 12/04/2022   Procedure: ESOPHAGOGASTRODUODENOSCOPY (EGD);  Surgeon: Wilhelmenia Aloha Raddle., MD;  Location: [facility];  Service: Gastroenterology;  Laterality: N/A;   EUS N/A 12/04/2022   Procedure: UPPER ENDOSCOPIC ULTRASOUND (EUS) RADIAL;  Surgeon: Wilhelmenia Aloha Raddle., MD;  Location: [facility];  Service: Gastroenterology;  Laterality: N/A;   FINE NEEDLE ASPIRATION N/A 12/04/2022   Procedure: FINE NEEDLE ASPIRATION (FNA) LINEAR;  Surgeon: Wilhelmenia Aloha Raddle., MD;  Location: [facility];  Service: Gastroenterology;  Laterality: N/A;   finger surgery  2019   Dupuytren's contracture surgery   OTHER SURGICAL HISTORY     Stomach was not developed at birth    Family History  Problem Relation Age of Onset   Emphysema Mother        heavy smoker   Heart disease Mother 60   Hyperlipidemia Mother    Hypertension Mother    Skin cancer Mother        melanomia, spread to her spine   Arthritis Mother    Kidney cancer Mother    Heart disease Father 36   Prostate cancer Father    Hyperlipidemia Father    Hypertension Father    Colon cancer Neg Hx    Esophageal cancer Neg Hx    Rectal cancer Neg Hx    Breast cancer Neg Hx    Pancreatic cancer  Neg Hx     Social History:  reports that she has never smoked. She has never used smokeless tobacco. She reports that she does not currently use alcohol. She reports that she does not use drugs.  Allergies:  Allergies  Allergen Reactions   Doxycycline  Nausea And Vomiting   Erythromycin Base Other (See Comments)    yeast infection, GI upset   Lactose Intolerance (Gi) Diarrhea    Upset stomach    Lisinopril Cough    Also had some itching     Metronidazole  Diarrhea, Itching and Nausea Only   Naproxen Other (See Comments)    GI upset   Prednisone Hives    anxiety, insomnia   Scallops [Shellfish Allergy] Nausea And Vomiting     Medications: I have reviewed the patient's current medications.  The PMH, PSH, Medications, Allergies, and SH were reviewed and updated.  ROS: Constitutional: Negative for fever, weight loss and weight gain. Cardiovascular: Negative for chest pain and dyspnea on exertion. Respiratory: Is not experiencing shortness of breath at rest. Gastrointestinal: Negative for nausea and vomiting. Neurological: Negative for headaches. Psychiatric: The patient is not nervous/anxious  Blood pressure 132/79, pulse 70, temperature 98.2 F (36.8 C), SpO2 93%.  PHYSICAL EXAM:  Exam: General: Well-developed, well-nourished Communication and Voice: Clear pitch and clarity Respiratory Respiratory effort: Equal inspiration and expiration without stridor Cardiovascular Peripheral Vascular: Warm extremities with equal color/perfusion Eyes: No nystagmus with equal extraocular motion bilaterally Neuro/Psych/Balance: Patient oriented to person, place, and time; Appropriate mood and affect; Gait is intact with no imbalance; Cranial nerves I-XII are intact Head and Face Inspection: Normocephalic and atraumatic without mass or lesion Palpation: Facial skeleton intact without bony stepoffs Salivary Glands: No mass or tenderness Facial Strength: Facial motility symmetric and full bilaterally ENT Pinna: External ear intact and fully developed External canal: Canal is patent with intact skin Tympanic Membrane: Clear and mobile External Nose: No scar or anatomic deformity Internal Nose: Septum intact and midline. No edema, polyp, or rhinorrhea Lips, Teeth, and gums: Mucosa and teeth intact and viable TMJ: No pain to palpation with full mobility Oral cavity/oropharynx: No erythema or exudate, no lesions present Neck Neck and Trachea: Midline trachea without mass or lesion Thyroid : No mass or nodularity Lymphatics: No lymphadenopathy   Assessment/Plan: Encounter Diagnoses  Name Primary?   Ear  pressure, bilateral    Tinnitus of both ears    Decreased hearing of both ears Yes    Assessment and Plan Assessment & Plan Eustachian tube dysfunction associated with allergic rhinitis/environmental allergies Eustachian tube dysfunction likely due to nasal congestion from allergic rhinitis. Normal ear exam.  - Continue daily antihistamine, consider rotating antihistamine medication. - Use nasal spray as needed. - Consider nasal saline rinses or saline spray to help with nasal dryness. - Provide handout on Eustachian tube dysfunction. - Consider allergy testing for potential allergy shots.  Tinnitus Decreased Hearing  Chronic tinnitus, worsening over time, likely associated with hearing loss. No proven treatments available. Management includes distraction techniques such as white noise or fan use. Hearing aids with masking features may be considered if hearing loss progresses. - Continue using white noise or fan for distraction. - schedule hearing test  Thank you for allowing me to participate in the care of this patient. Please do not hesitate to contact me with any questions or concerns.   Elena Larry, MD Otolaryngology [facility] Phone: [phone] Fax: [phone]    04/20/2024, 1:59 PM

## 2024-04-20 NOTE — Patient Instructions (Signed)

## 2024-04-26 ENCOUNTER — Ambulatory Visit: Admitting: Family Medicine

## 2024-04-26 ENCOUNTER — Encounter: Payer: Self-pay | Admitting: Family Medicine

## 2024-04-26 ENCOUNTER — Ambulatory Visit: Payer: Self-pay | Admitting: Family Medicine

## 2024-04-26 VITALS — BP 146/74 | HR 64 | Temp 97.6°F | Ht 65.75 in | Wt 161.3 lb

## 2024-04-26 DIAGNOSIS — Z0000 Encounter for general adult medical examination without abnormal findings: Secondary | ICD-10-CM

## 2024-04-26 DIAGNOSIS — E785 Hyperlipidemia, unspecified: Secondary | ICD-10-CM

## 2024-04-26 LAB — LIPID PANEL
Cholesterol: 227 mg/dL — ABNORMAL HIGH (ref 0–200)
HDL: 71.4 mg/dL (ref >39.00)
LDL Cholesterol: 141 mg/dL — ABNORMAL HIGH (ref 0–99)
NonHDL: 155.72
Total CHOL/HDL Ratio: 3
Triglycerides: 76 mg/dL (ref 0.0–149.0)
VLDL: 15.2 mg/dL (ref 0.0–40.0)

## 2024-04-26 LAB — CBC WITH DIFFERENTIAL/PLATELET
Basophils Absolute: 0 K/uL (ref 0.0–0.1)
Basophils Relative: 0.7 % (ref 0.0–3.0)
Eosinophils Absolute: 0.2 K/uL (ref 0.0–0.7)
Eosinophils Relative: 2.5 % (ref 0.0–5.0)
HCT: 37.8 % (ref 36.0–46.0)
Hemoglobin: 12.7 g/dL (ref 12.0–15.0)
Lymphocytes Relative: 23.9 % (ref 12.0–46.0)
Lymphs Abs: 1.4 K/uL (ref 0.7–4.0)
MCHC: 33.5 g/dL (ref 30.0–36.0)
MCV: 83.6 fl (ref 78.0–100.0)
Monocytes Absolute: 0.5 K/uL (ref 0.1–1.0)
Monocytes Relative: 8.4 % (ref 3.0–12.0)
Neutro Abs: 3.9 K/uL (ref 1.4–7.7)
Neutrophils Relative %: 64.5 % (ref 43.0–77.0)
Platelets: 211 K/uL (ref 150.0–400.0)
RBC: 4.52 Mil/uL (ref 3.87–5.11)
RDW: 14.9 % (ref 11.5–15.5)
WBC: 6 K/uL (ref 4.0–10.5)

## 2024-04-26 LAB — HEPATIC FUNCTION PANEL
ALT: 12 U/L (ref 0–35)
AST: 18 U/L (ref 0–37)
Albumin: 4.4 g/dL (ref 3.5–5.2)
Alkaline Phosphatase: 74 U/L (ref 39–117)
Bilirubin, Direct: 0.1 mg/dL (ref 0.0–0.3)
Total Bilirubin: 0.8 mg/dL (ref 0.2–1.2)
Total Protein: 6.4 g/dL (ref 6.0–8.3)

## 2024-04-26 LAB — BASIC METABOLIC PANEL WITH GFR
BUN: 23 mg/dL (ref 6–23)
CO2: 29 meq/L (ref 19–32)
Calcium: 9.6 mg/dL (ref 8.4–10.5)
Chloride: 103 meq/L (ref 96–112)
Creatinine, Ser: 0.73 mg/dL (ref 0.40–1.20)
GFR: 79.44 mL/min (ref >60.00)
Glucose, Bld: 88 mg/dL (ref 70–99)
Potassium: 3.9 meq/L (ref 3.5–5.1)
Sodium: 140 meq/L (ref 135–145)

## 2024-04-26 NOTE — Progress Notes (Signed)
 Established Patient Office Visit  Subjective   Patient ID: Rebecca Reid, female    DOB: 20-Jul-1946  Age: 78 y.o. MRN: [id number]  Chief Complaint  Patient presents with   Annual Exam    HPI   Rebecca Reid is here for physical exam/well visit.  Generally doing well except for some recent right lateral hip pain.  Denies any injury.  Affecting her mobility and she is also having frequent night pain.  Denies any falls.  She feels like her balance is off some because of her hip pain.  She is not been out of exercises much.  She has history of hypertension which is controlled with amlodipine , metoprolol , and telmisartan .  History of pancreatic cyst which was smaller by MRI last November following aspiration.  She has follow-up scheduled soon.  She has history of mild hyperlipidemia.  Family history of CAD as below  Health maintenance reviewed:  Health Maintenance  Topic Date Due   Medicare Annual Wellness (AWV)  09/25/2023   COVID-19 Vaccine (7 - 2024-25 season) 03/21/2024   DTaP/Tdap/Td (4 - Td or Tdap) 03/30/2029   Pneumococcal Vaccine: 50+ Years  Completed   Influenza Vaccine  Completed   DEXA SCAN  Completed   Hepatitis C Screening  Completed   Zoster Vaccines- Shingrix  Completed   Meningococcal B Vaccine  Aged Out   Mammogram  Discontinued   Fecal DNA (Cologuard)  Discontinued   - She is already had flu vaccine.  Declines further mammograms.  Aged out of further colonoscopy  Family history-father had CAD with CABG in his 62s but was a smoker.  Mom also was a smoker and had congestive heart failure in her 42s.  She is not sure regarding etiology of her heart failure.  Social history-married.  Never smoked.  No regular alcohol.  Retired.  Stays very active with volunteerism with her church and other activities as well  Past Medical History:  Diagnosis Date   ALLERGIC RHINITIS CAUSE UNSPECIFIED 04/30/2009   Anxiety    Arthritis    DEPRESSION, HX OF 04/11/2010   Dysuria  05/06/2010   Gallstones    GERD 04/30/2009   HYPERLIPIDEMIA 04/30/2009   HYPERTENSION 04/30/2009   Lactose intolerance    LEG EDEMA 12/12/2009   OSTEOPENIA 04/30/2009   PVC (premature ventricular contraction)    Status post dilation of esophageal narrowing    Past Surgical History:  Procedure Laterality Date   ABDOMINAL HYSTERECTOMY  1986   AUGMENTATION MAMMAPLASTY Bilateral    BIOPSY  12/04/2022   Procedure: BIOPSY;  Surgeon: Wilhelmenia Aloha Raddle., MD;  Location: [facility];  Service: Gastroenterology;;   BREAST ENHANCEMENT SURGERY  1987   BREAST SURGERY  1974   biopsy   CARDIAC CATHETERIZATION N/A 11/02/2015   Procedure: Left Heart Cath and Coronary Angiography;  Surgeon: Victory LELON Sharps, MD;  Location: [facility] INVASIVE CV LAB;  Service: Cardiovascular;  Laterality: N/A;   Carpel tunel Right    CATARACT EXTRACTION, BILATERAL Bilateral    one in feb and one in March Dr. Milan   CHOLECYSTECTOMY  1974   ectopic pregnancy     ESOPHAGOGASTRODUODENOSCOPY N/A 12/04/2022   Procedure: ESOPHAGOGASTRODUODENOSCOPY (EGD);  Surgeon: Wilhelmenia Aloha Raddle., MD;  Location: [facility];  Service: Gastroenterology;  Laterality: N/A;   EUS N/A 12/04/2022   Procedure: UPPER ENDOSCOPIC ULTRASOUND (EUS) RADIAL;  Surgeon: Wilhelmenia Aloha Raddle., MD;  Location: [facility];  Service: Gastroenterology;  Laterality: N/A;   FINE NEEDLE ASPIRATION N/A 12/04/2022   Procedure: FINE  NEEDLE ASPIRATION (FNA) LINEAR;  Surgeon: Wilhelmenia Aloha Raddle., MD;  Location: [facility];  Service: Gastroenterology;  Laterality: N/A;   finger surgery  2019   Dupuytren's contracture surgery   OTHER SURGICAL HISTORY     Stomach was not developed at birth    reports that she has never smoked. She has never used smokeless tobacco. She reports that she does not currently use alcohol. She reports that she does not use drugs. family history includes Arthritis in her mother; Emphysema in her mother; Heart disease (age of onset:  42) in her father; Heart disease (age of onset: 60) in her mother; Hyperlipidemia in her father and mother; Hypertension in her father and mother; Kidney cancer in her mother; Prostate cancer in her father; Skin cancer in her mother. Allergies  Allergen Reactions   Doxycycline  Nausea And Vomiting   Erythromycin Base Other (See Comments)    yeast infection, GI upset   Lactose Intolerance (Gi) Diarrhea    Upset stomach    Lisinopril Cough    Also had some itching     Metronidazole  Diarrhea, Itching and Nausea Only   Naproxen Other (See Comments)    GI upset   Prednisone Hives    anxiety, insomnia   Scallops [Shellfish Allergy] Nausea And Vomiting     Review of Systems  Constitutional:  Negative for chills, fever, malaise/fatigue and weight loss.  HENT:  Negative for hearing loss.   Eyes:  Negative for blurred vision and double vision.  Respiratory:  Negative for cough and shortness of breath.   Cardiovascular:  Negative for chest pain, palpitations and leg swelling.  Gastrointestinal:  Negative for abdominal pain, blood in stool, constipation and diarrhea.  Genitourinary:  Negative for dysuria.  Skin:  Negative for rash.  Neurological:  Negative for dizziness, speech change, seizures, loss of consciousness and headaches.  Psychiatric/Behavioral:  Negative for depression.       Objective:     BP (!) 146/74   Pulse 64   Temp 97.6 F (36.4 C) (Oral)   Ht 5' 5.75 (1.67 m)   Wt 161 lb 4.8 oz (73.2 kg)   SpO2 98%   BMI 26.23 kg/m  BP Readings from Last 3 Encounters:  04/26/24 (!) 146/74  04/20/24 132/79  03/18/24 (!) 146/84   Wt Readings from Last 3 Encounters:  04/26/24 161 lb 4.8 oz (73.2 kg)  03/18/24 162 lb 3.2 oz (73.6 kg)  02/22/24 161 lb 3.2 oz (73.1 kg)      Physical Exam Vitals reviewed.  Constitutional:      General: She is not in acute distress.    Appearance: She is well-developed. She is not ill-appearing.  HENT:     Head: Normocephalic and  atraumatic.     Right Ear: Tympanic membrane normal.     Left Ear: Tympanic membrane normal.     Mouth/Throat:     Mouth: Mucous membranes are moist.     Pharynx: Oropharynx is clear.  Eyes:     Pupils: Pupils are equal, round, and reactive to light.  Neck:     Thyroid : No thyromegaly.  Cardiovascular:     Rate and Rhythm: Normal rate and regular rhythm.     Heart sounds: Normal heart sounds. No murmur heard. Pulmonary:     Effort: No respiratory distress.     Breath sounds: Normal breath sounds. No wheezing or rales.  Abdominal:     General: Bowel sounds are normal. There is no distension.  Palpations: Abdomen is soft. There is no mass.     Tenderness: There is no abdominal tenderness. There is no guarding or rebound.  Musculoskeletal:        General: Normal range of motion.     Cervical back: Normal range of motion and neck supple.     Comments: Excellent range of motion right hip.  She has some mild tenderness over the greater trochanteric bursa.  Lymphadenopathy:     Cervical: No cervical adenopathy.  Skin:    Findings: No rash.  Neurological:     Mental Status: She is alert and oriented to person, place, and time.     Cranial Nerves: No cranial nerve deficit.  Psychiatric:        Behavior: Behavior normal.        Thought Content: Thought content normal.        Judgment: Judgment normal.      No results found for any visits on 04/26/24.    The 10-year ASCVD risk score (Arnett DK, et al., 2019) is: 32.3%    Assessment & Plan:   Problem List Items Addressed This Visit       Unprioritized   Dyslipidemia - Primary   Relevant Orders   Lipid panel   Hepatic function panel   Other Visit Diagnoses       Physical exam       Relevant Orders   Basic metabolic panel with GFR   CBC with Differential/Platelet     Healthy 78 year old female.  Initial blood pressure up today but well-controlled by home readings.  We also compared her cuff with ours today and  obtained very similar reading.  She has had consistent readings around 115 systolic by home readings.  -Continue healthy lifestyle changes - Flu vaccine already given - Other vaccines up-to-date with exception of recent COVID vaccine which she will consider especially given her husband's history of COPD - Patient will schedule follow-up to address her right hip pain.  Suspect she has bursitis issue.  No follow-ups on file.    Wolm Scarlet, MD

## 2024-04-27 ENCOUNTER — Encounter: Payer: Self-pay | Admitting: Family Medicine

## 2024-04-27 ENCOUNTER — Ambulatory Visit: Admitting: Family Medicine

## 2024-04-27 VITALS — BP 140/60 | HR 73 | Temp 97.6°F | Wt 163.0 lb

## 2024-04-27 DIAGNOSIS — M25559 Pain in unspecified hip: Secondary | ICD-10-CM | POA: Diagnosis not present

## 2024-04-27 DIAGNOSIS — M7061 Trochanteric bursitis, right hip: Secondary | ICD-10-CM

## 2024-04-27 MED ORDER — METHYLPREDNISOLONE ACETATE 40 MG/ML IJ SUSP
40.0000 mg | Freq: Once | INTRAMUSCULAR | Status: AC
  Administered 2024-04-27: 40 mg via INTRA_ARTICULAR

## 2024-04-27 NOTE — Progress Notes (Signed)
 Established Patient Office Visit  Subjective   Patient ID: Rebecca Reid, female    DOB: 09-05-1945  Age: 78 y.o. MRN: [id number]  Chief Complaint  Patient presents with   Hip Pain    HPI   Macall seen to further discuss right hip pain.  Location is lateral.  Is been present for months.  She is having increasing night pain and sometimes wakes up when she rolls over on the side.  Some pain with ambulation.  Does not have any anterior or medial hip pain.  Has tried Tylenol  and tramadol  with limited relief.  Has had injection for bursitis in the past and was inquiring about this with her recent visit yesterday.  She was here for physical exam yesterday.  Pain is moderate to severe and impacting her activities.  She stays very active with volunteerism and other activities  Past Medical History:  Diagnosis Date   ALLERGIC RHINITIS CAUSE UNSPECIFIED 04/30/2009   Anxiety    Arthritis    DEPRESSION, HX OF 04/11/2010   Dysuria 05/06/2010   Gallstones    GERD 04/30/2009   HYPERLIPIDEMIA 04/30/2009   HYPERTENSION 04/30/2009   Lactose intolerance    LEG EDEMA 12/12/2009   OSTEOPENIA 04/30/2009   PVC (premature ventricular contraction)    Status post dilation of esophageal narrowing    Past Surgical History:  Procedure Laterality Date   ABDOMINAL HYSTERECTOMY  1986   AUGMENTATION MAMMAPLASTY Bilateral    BIOPSY  12/04/2022   Procedure: BIOPSY;  Surgeon: Wilhelmenia Aloha Raddle., MD;  Location: [facility];  Service: Gastroenterology;;   BREAST ENHANCEMENT SURGERY  1987   BREAST SURGERY  1974   biopsy   CARDIAC CATHETERIZATION N/A 11/02/2015   Procedure: Left Heart Cath and Coronary Angiography;  Surgeon: Victory LELON Sharps, MD;  Location[facility] INVASIVE CV LAB;  Service: Cardiovascular;  Laterality: N/A;   Carpel tunel Right    CATARACT EXTRACTION, BILATERAL Bilateral    one in feb and one in March Dr. Milan   CHOLECYSTECTOMY  1974   ectopic pregnancy     ESOPHAGOGASTRODUODENOSCOPY N/A  12/04/2022   Procedure: ESOPHAGOGASTRODUODENOSCOPY (EGD);  Surgeon: Wilhelmenia Aloha Raddle., MD;  Location: [facility];  Service: Gastroenterology;  Laterality: N/A;   EUS N/A 12/04/2022   Procedure: UPPER ENDOSCOPIC ULTRASOUND (EUS) RADIAL;  Surgeon: Wilhelmenia Aloha Raddle., MD;  Location: [facility];  Service: Gastroenterology;  Laterality: N/A;   FINE NEEDLE ASPIRATION N/A 12/04/2022   Procedure: FINE NEEDLE ASPIRATION (FNA) LINEAR;  Surgeon: Wilhelmenia Aloha Raddle., MD;  Location: [facility];  Service: Gastroenterology;  Laterality: N/A;   finger surgery  2019   Dupuytren's contracture surgery   OTHER SURGICAL HISTORY     Stomach was not developed at birth    reports that she has never smoked. She has never used smokeless tobacco. She reports that she does not currently use alcohol. She reports that she does not use drugs. family history includes Arthritis in her mother; Emphysema in her mother; Heart disease (age of onset: 67) in her father; Heart disease (age of onset: 58) in her mother; Hyperlipidemia in her father and mother; Hypertension in her father and mother; Kidney cancer in her mother; Prostate cancer in her father; Skin cancer in her mother. Allergies  Allergen Reactions   Doxycycline  Nausea And Vomiting   Erythromycin Base Other (See Comments)    yeast infection, GI upset   Lactose Intolerance (Gi) Diarrhea    Upset stomach    Lisinopril Cough    Also  had some itching     Metronidazole  Diarrhea, Itching and Nausea Only   Naproxen Other (See Comments)    GI upset   Prednisone Hives    anxiety, insomnia   Scallops [Shellfish Allergy] Nausea And Vomiting    Review of Systems  Constitutional:  Negative for chills and fever.  Musculoskeletal:  Negative for back pain.      Objective:     BP (!) 140/60   Pulse 73   Temp 97.6 F (36.4 C) (Oral)   Wt 163 lb (73.9 kg)   SpO2 96%   BMI 26.51 kg/m    Physical Exam Vitals reviewed.  Constitutional:       General: She is not in acute distress.    Appearance: She is not ill-appearing.  Cardiovascular:     Rate and Rhythm: Normal rate and regular rhythm.  Musculoskeletal:     Comments: Right hip excellent range of motion.  She has tenderness localized over the right greater trochanteric bursa.  No visible swelling or erythema.  She does have a small area of ecchymosis but this is several centimeters superior to the area of tenderness.  Neurological:     Mental Status: She is alert.      No results found for any visits on 04/27/24.    The 10-year ASCVD risk score (Arnett DK, et al., 2019) is: 30.6%    Assessment & Plan:   Problem List Items Addressed This Visit   None Visit Diagnoses       Hip pain, unspecified laterality    -  Primary   Relevant Medications   methylPREDNISolone  acetate (DEPO-MEDROL ) injection 40 mg (Completed)     Right lateral hip pain.  She has tenderness localized over the greater trochanteric bursa.  Patient requesting steroid injection.  She is aware of risk including low risk of infection, bruising, pain.  Skin prepped with Betadine.  Using 25-gauge 1-1/2 inch needle injected 40 mg of Depo-Medrol  and 2 cc of plain Xylocaine .  Patient tolerated well.  Be in touch in a week or 2 if not seeing further improvements  No follow-ups on file.    Wolm Scarlet, MD

## 2024-05-02 ENCOUNTER — Encounter: Payer: Self-pay | Admitting: Family Medicine

## 2024-05-05 NOTE — Progress Notes (Signed)
 Rebecca Reid                                          MRN: [id number]   05/05/2024   The [facility] Quality Team Specialist reviewed this patient medical record for the purposes of chart review for care gap closure. The following were reviewed: chart review for care gap closure-controlling blood pressure.    [facility] Quality Team

## 2024-05-09 ENCOUNTER — Ambulatory Visit (INDEPENDENT_AMBULATORY_CARE_PROVIDER_SITE_OTHER): Admitting: Audiology

## 2024-05-09 DIAGNOSIS — H903 Sensorineural hearing loss, bilateral: Secondary | ICD-10-CM | POA: Diagnosis not present

## 2024-05-09 NOTE — Progress Notes (Signed)
  [address], [address] [place], [place] [address] [phone]  Audiological Evaluation    Name: Rebecca Reid     DOB:   06/11/1946      MRN:   [id number]                                                                                     Service Date: 05/09/2024     Accompanied by: unaccompanied   Patient comes today after Dr. Soldatova, ENT sent a referral for a hearing evaluation due to concerns with hearing loss.   Symptoms Yes Details  Hearing loss  []    Tinnitus  [x]  Both ears since she was in her 20's  Ear pain/ infections/pressure  [x]  Feels ears are always full  Balance problems  []    Noise exposure history  []    Previous ear surgeries  []    Family history of hearing loss  []    Amplification  []    Other  []      Otoscopy: Right ear: Clear external ear canal and notable landmarks visualized on the tympanic membrane. Left ear:  Clear external ear canal and notable landmarks visualized on the tympanic membrane.  Tympanometry: Right ear: Type A- Normal external ear canal volume with normal middle ear pressure and tympanic membrane compliance. Left ear: Type A- Normal external ear canal volume with normal middle ear pressure and tympanic membrane compliance.   Pure tone Audiometry: Right ear- Normal hearing from 250-2000Hz , then mild to moderate sensorineural hearing loss from 3000 Hz - 8000 Hz. Left ear-  Normal hearing from 250-2000Hz , then mild to moderately severe sensorineural hearing loss from 3000 Hz - 8000 Hz.  Speech Audiometry: Right ear- Speech Reception Threshold (SRT) was obtained at 15 dBHL. Left ear-Speech Reception Threshold (SRT) was obtained at 10 dBHL.   Word Recognition Score Tested using NU-6 (recorded) Right ear: 96% was obtained at a presentation level of 60 dBHL with contralateral masking which is deemed as  excellent. Left ear: 100% was obtained at a presentation level of 60 dBHL with contralateral masking which is deemed as   excellent.   The hearing test results were completed under headphones and results are deemed to be of good reliability. Test technique:  conventional    Impression: There is not a significant difference in pure-tone thresholds between ears. There is not a significant difference in the word recognition score in between ears.    Recommendations: Follow up with ENT as scheduled for today. Return for a hearing evaluation at least in 2-3 years, before if concerns with hearing changes arise or per MD recommendation. Consider a communication needs assessment after medical clearance for hearing aids is obtained. Patient was provided with a non-comprehensive list of clinics nearby that work with hearing aids.    Annissa Andreoni MARIE LEROUX-MARTINEZ, AUD

## 2024-05-30 ENCOUNTER — Encounter: Payer: Self-pay | Admitting: Family Medicine

## 2024-05-30 ENCOUNTER — Ambulatory Visit (HOSPITAL_BASED_OUTPATIENT_CLINIC_OR_DEPARTMENT_OTHER)
Admission: RE | Admit: 2024-05-30 | Discharge: 2024-05-30 | Disposition: A | Discharge status: Home / Self Care | Source: Ambulatory Visit | Attending: Family Medicine | Admitting: Family Medicine

## 2024-05-30 ENCOUNTER — Ambulatory Visit: Payer: Self-pay | Admitting: Family Medicine

## 2024-05-30 ENCOUNTER — Ambulatory Visit: Admitting: Family Medicine

## 2024-05-30 VITALS — BP 140/80 | HR 68 | Temp 97.4°F | Wt 161.4 lb

## 2024-05-30 DIAGNOSIS — M25561 Pain in right knee: Secondary | ICD-10-CM

## 2024-05-30 DIAGNOSIS — H00022 Hordeolum internum right lower eyelid: Secondary | ICD-10-CM | POA: Diagnosis not present

## 2024-05-30 DIAGNOSIS — Z043 Encounter for examination and observation following other accident: Secondary | ICD-10-CM | POA: Diagnosis not present

## 2024-05-30 MED ORDER — TRAMADOL HCL 50 MG PO TABS: ORAL_TABLET | ORAL | 1 refills | Status: AC

## 2024-05-30 NOTE — Progress Notes (Signed)
 Established Patient Office Visit  Subjective   Patient ID: Rebecca Reid, female    DOB: 06/26/1946  Age: 78 y.o. MRN: [id number]  Chief Complaint  Patient presents with   Knee Injury    HPI   Ivey is seen with right knee injury which occurred about a week and a half ago.  She was on a ladder in their garage and stepped off and lost her balance and fell and landed onto her right knee.  She noticed some bruising afterwards.  She has been able to ambulate but has some persistent soreness.  No instability of the knee.  She has been using some Tylenol  and leftover tramadol .  Denied any head injury.  She had a small bruise right shoulder but full range of motion right shoulder.  Her main concern was the right knee injury.  No loss of consciousness.  Past Medical History:  Diagnosis Date   ALLERGIC RHINITIS CAUSE UNSPECIFIED 04/30/2009   Anxiety    Arthritis    DEPRESSION, HX OF 04/11/2010   Dysuria 05/06/2010   Gallstones    GERD 04/30/2009   HYPERLIPIDEMIA 04/30/2009   HYPERTENSION 04/30/2009   Lactose intolerance    LEG EDEMA 12/12/2009   OSTEOPENIA 04/30/2009   PVC (premature ventricular contraction)    Status post dilation of esophageal narrowing    Past Surgical History:  Procedure Laterality Date   ABDOMINAL HYSTERECTOMY  1986   AUGMENTATION MAMMAPLASTY Bilateral    BIOPSY  12/04/2022   Procedure: BIOPSY;  Surgeon: Wilhelmenia Aloha Raddle., MD;  Location: [facility];  Service: Gastroenterology;;   BREAST ENHANCEMENT SURGERY  1987   BREAST SURGERY  1974   biopsy   CARDIAC CATHETERIZATION N/A 11/02/2015   Procedure: Left Heart Cath and Coronary Angiography;  Surgeon: Victory LELON Sharps, MD;  Location: [facility] INVASIVE CV LAB;  Service: Cardiovascular;  Laterality: N/A;   Carpel tunel Right    CATARACT EXTRACTION, BILATERAL Bilateral    one in feb and one in March Dr. Milan   CHOLECYSTECTOMY  1974   ectopic pregnancy     ESOPHAGOGASTRODUODENOSCOPY N/A 12/04/2022    Procedure: ESOPHAGOGASTRODUODENOSCOPY (EGD);  Surgeon: Wilhelmenia Aloha Raddle., MD;  Location: [facility];  Service: Gastroenterology;  Laterality: N/A;   EUS N/A 12/04/2022   Procedure: UPPER ENDOSCOPIC ULTRASOUND (EUS) RADIAL;  Surgeon: Wilhelmenia Aloha Raddle., MD;  Location: [facility];  Service: Gastroenterology;  Laterality: N/A;   FINE NEEDLE ASPIRATION N/A 12/04/2022   Procedure: FINE NEEDLE ASPIRATION (FNA) LINEAR;  Surgeon: Wilhelmenia Aloha Raddle., MD;  Location: [facility];  Service: Gastroenterology;  Laterality: N/A;   finger surgery  2019   Dupuytren's contracture surgery   OTHER SURGICAL HISTORY     Stomach was not developed at birth    reports that she has never smoked. She has never used smokeless tobacco. She reports that she does not currently use alcohol. She reports that she does not use drugs. family history includes Arthritis in her mother; Emphysema in her mother; Heart disease (age of onset: 55) in her father; Heart disease (age of onset: 84) in her mother; Hyperlipidemia in her father and mother; Hypertension in her father and mother; Kidney cancer in her mother; Prostate cancer in her father; Skin cancer in her mother. Allergies  Allergen Reactions   Doxycycline  Nausea And Vomiting   Erythromycin Base Other (See Comments)    yeast infection, GI upset   Lactose Intolerance (Gi) Diarrhea    Upset stomach    Lisinopril Cough  Also had some itching     Metronidazole  Diarrhea, Itching and Nausea Only   Naproxen Other (See Comments)    GI upset   Prednisone Hives    anxiety, insomnia   Scallops [Shellfish Allergy] Nausea And Vomiting    Review of Systems  Neurological:  Negative for dizziness, loss of consciousness and headaches.      Objective:     BP (!) 140/80   Pulse 68   Temp (!) 97.4 F (36.3 C) (Oral)   Wt 161 lb 6.4 oz (73.2 kg)   SpO2 98%   BMI 26.25 kg/m  BP Readings from Last 3 Encounters:  05/30/24 (!) 140/80  04/27/24 (!) 140/60   04/26/24 (!) 146/74   Wt Readings from Last 3 Encounters:  05/30/24 161 lb 6.4 oz (73.2 kg)  04/27/24 163 lb (73.9 kg)  04/26/24 161 lb 4.8 oz (73.2 kg)      Physical Exam Vitals reviewed.  Constitutional:      General: She is not in acute distress.    Appearance: She is not ill-appearing.  Cardiovascular:     Rate and Rhythm: Normal rate and regular rhythm.  Musculoskeletal:     Comments: Right knee reveals approximately 8 x 10 cm area of ecchymosis anterior knee over the patella region.  She does have some tenderness on the mid pole of the patella.  No effusion.  No prepatellar bursitis.  No visible breaks in the skin.  No other bony tenderness noted  Neurological:     Mental Status: She is alert.      No results found for any visits on 05/30/24.    The 10-year ASCVD risk score (Arnett DK, et al., 2019) is: 30.6%    Assessment & Plan:   Problem List Items Addressed This Visit   None Visit Diagnoses       Acute pain of right knee    -  Primary   Relevant Orders   DG Knee Complete 4 Views Right     Patient presents with acute pain right knee after falling directly onto her patella when coming down from a ladder after losing balance.  She does have some visible ecchymosis and tenderness over the patellar region.  Recommend x-ray of right knee to further assess with 4 views  Refill tramadol  50 mg 1 every 6 hours as needed for pain #15 with no refills  No follow-ups on file.    Wolm Scarlet, MD

## 2024-06-30 ENCOUNTER — Other Ambulatory Visit: Payer: Self-pay | Admitting: Family Medicine

## 2024-07-06 ENCOUNTER — Ambulatory Visit: Admitting: Family Medicine

## 2024-07-06 ENCOUNTER — Encounter: Payer: Self-pay | Admitting: Family Medicine

## 2024-07-06 VITALS — BP 126/60 | HR 68 | Temp 97.7°F | Wt 166.5 lb

## 2024-07-06 DIAGNOSIS — M25561 Pain in right knee: Secondary | ICD-10-CM | POA: Diagnosis not present

## 2024-07-06 NOTE — Progress Notes (Signed)
 Established Patient Office Visit  Subjective   Patient ID: Rebecca Reid, female    DOB: September 18, 1945  Age: 78 y.o. MRN: [id number]  Chief Complaint  Patient presents with   Knee Pain    HPI   Madisson is seen with ongoing right knee pain.  She was seen back in August and had exquisite tenderness over her Pes anserine bursa.  We did a steroid injection at that time into that bursa and she saw a little improvement.  Her current pain is more medial near the joint line region.  Denies any specific injury.  Recent x-rays showed no significant degenerative changes with no acute bony abnormality.  She feels like she has had some swelling.  No locking or giving way.  Symptoms are limiting her activities.  No warmth or erythema.  Has seen Dr. Ivonne in the past and would like to see him again if possible  Past Medical History:  Diagnosis Date   ALLERGIC RHINITIS CAUSE UNSPECIFIED 04/30/2009   Anxiety    Arthritis    DEPRESSION, HX OF 04/11/2010   Dysuria 05/06/2010   Gallstones    GERD 04/30/2009   HYPERLIPIDEMIA 04/30/2009   HYPERTENSION 04/30/2009   Lactose intolerance    LEG EDEMA 12/12/2009   OSTEOPENIA 04/30/2009   PVC (premature ventricular contraction)    Status post dilation of esophageal narrowing    Past Surgical History:  Procedure Laterality Date   ABDOMINAL HYSTERECTOMY  1986   AUGMENTATION MAMMAPLASTY Bilateral    BIOPSY  12/04/2022   Procedure: BIOPSY;  Surgeon: Wilhelmenia Aloha Raddle., MD;  Location: [facility];  Service: Gastroenterology;;   BREAST ENHANCEMENT SURGERY  1987   BREAST SURGERY  1974   biopsy   CARDIAC CATHETERIZATION N/A 11/02/2015   Procedure: Left Heart Cath and Coronary Angiography;  Surgeon: Victory LELON Sharps, MD;  Location: [facility] INVASIVE CV LAB;  Service: Cardiovascular;  Laterality: N/A;   Carpel tunel Right    CATARACT EXTRACTION, BILATERAL Bilateral    one in feb and one in March Dr. Milan   CHOLECYSTECTOMY  1974   ectopic pregnancy      ESOPHAGOGASTRODUODENOSCOPY N/A 12/04/2022   Procedure: ESOPHAGOGASTRODUODENOSCOPY (EGD);  Surgeon: Wilhelmenia Aloha Raddle., MD;  Location: [facility];  Service: Gastroenterology;  Laterality: N/A;   EUS N/A 12/04/2022   Procedure: UPPER ENDOSCOPIC ULTRASOUND (EUS) RADIAL;  Surgeon: Wilhelmenia Aloha Raddle., MD;  Location: [facility];  Service: Gastroenterology;  Laterality: N/A;   FINE NEEDLE ASPIRATION N/A 12/04/2022   Procedure: FINE NEEDLE ASPIRATION (FNA) LINEAR;  Surgeon: Wilhelmenia Aloha Raddle., MD;  Location: [facility];  Service: Gastroenterology;  Laterality: N/A;   finger surgery  2019   Dupuytren's contracture surgery   OTHER SURGICAL HISTORY     Stomach was not developed at birth    reports that she has never smoked. She has never used smokeless tobacco. She reports that she does not currently use alcohol. She reports that she does not use drugs. family history includes Arthritis in her mother; Emphysema in her mother; Heart disease (age of onset: 28) in her father; Heart disease (age of onset: 51) in her mother; Hyperlipidemia in her father and mother; Hypertension in her father and mother; Kidney cancer in her mother; Prostate cancer in her father; Skin cancer in her mother. Allergies[1]  Review of Systems  Constitutional:  Negative for chills and fever.      Objective:     BP 126/60   Pulse 68   Temp 97.7 F (36.5 C) (  Oral)   Wt 166 lb 8 oz (75.5 kg)   SpO2 97%   BMI 27.08 kg/m  BP Readings from Last 3 Encounters:  07/06/24 126/60  05/30/24 (!) 140/80  04/27/24 (!) 140/60   Wt Readings from Last 3 Encounters:  07/06/24 166 lb 8 oz (75.5 kg)  05/30/24 161 lb 6.4 oz (73.2 kg)  04/27/24 163 lb (73.9 kg)      Physical Exam Vitals reviewed.  Constitutional:      General: She is not in acute distress.    Appearance: She is not ill-appearing.  Cardiovascular:     Rate and Rhythm: Normal rate and regular rhythm.  Pulmonary:     Effort: Pulmonary effort is  normal.     Breath sounds: Normal breath sounds. No rales.  Musculoskeletal:     Comments: Right knee reveals small effusion.  She has some mild medial joint line tenderness.  No lateral tenderness.  Slight tenderness over the Pez anserine bursa region.  No erythema or warmth.  No ecchymosis.  Full range of motion.  Neurological:     Mental Status: She is alert.      No results found for any visits on 07/06/24.    The 10-year ASCVD risk score (Arnett DK, et al., 2019) is: 25.6%    Assessment & Plan:   Problem List Items Addressed This Visit   None Visit Diagnoses       Right knee pain, unspecified chronicity    -  Primary   Relevant Orders   Ambulatory referral to Orthopedic Surgery     Several month history of right mostly medial knee pain.  Suspect probably meniscal.  She has some medial joint line tenderness.  Set up orthopedic referral.  Patient request to [facility] who she has seen in the past.  She has tried multiple conservative things including heat, ice, Voltaren  topically without improvement  No follow-ups on file.    Wolm Scarlet, MD     [1]  Allergies Allergen Reactions   Doxycycline  Nausea And Vomiting   Erythromycin Base Other (See Comments)    yeast infection, GI upset   Lactose Intolerance (Gi) Diarrhea    Upset stomach    Lisinopril Cough    Also had some itching     Metronidazole  Diarrhea, Itching and Nausea Only   Naproxen Other (See Comments)    GI upset   Prednisone Hives    anxiety, insomnia   Scallops [Shellfish Allergy] Nausea And Vomiting
